# Patient Record
Sex: Female | Born: 1940
Health system: Southern US, Community
[De-identification: ages and names within clinical notes are randomized; demographics above are authoritative.]

## PROBLEM LIST (undated history)

## (undated) DIAGNOSIS — H9203 Otalgia, bilateral: Secondary | ICD-10-CM

## (undated) DIAGNOSIS — B9681 Helicobacter pylori [H. pylori] as the cause of diseases classified elsewhere: Secondary | ICD-10-CM

## (undated) DIAGNOSIS — Z8639 Personal history of other endocrine, nutritional and metabolic disease: Secondary | ICD-10-CM

## (undated) DIAGNOSIS — R42 Dizziness and giddiness: Secondary | ICD-10-CM

## (undated) DIAGNOSIS — L309 Dermatitis, unspecified: Secondary | ICD-10-CM

## (undated) DIAGNOSIS — J45909 Unspecified asthma, uncomplicated: Secondary | ICD-10-CM

## (undated) DIAGNOSIS — E1159 Type 2 diabetes mellitus with other circulatory complications: Secondary | ICD-10-CM

## (undated) DIAGNOSIS — C801 Malignant (primary) neoplasm, unspecified: Secondary | ICD-10-CM

## (undated) DIAGNOSIS — R12 Heartburn: Secondary | ICD-10-CM

## (undated) DIAGNOSIS — H359 Unspecified retinal disorder: Secondary | ICD-10-CM

## (undated) DIAGNOSIS — L821 Other seborrheic keratosis: Secondary | ICD-10-CM

## (undated) DIAGNOSIS — M1990 Unspecified osteoarthritis, unspecified site: Secondary | ICD-10-CM

## (undated) DIAGNOSIS — I10 Essential (primary) hypertension: Secondary | ICD-10-CM

## (undated) DIAGNOSIS — M26621 Arthralgia of right temporomandibular joint: Secondary | ICD-10-CM

## (undated) DIAGNOSIS — K2970 Gastritis, unspecified, without bleeding: Secondary | ICD-10-CM

## (undated) DIAGNOSIS — M4802 Spinal stenosis, cervical region: Secondary | ICD-10-CM

## (undated) DIAGNOSIS — I152 Hypertension secondary to endocrine disorders: Secondary | ICD-10-CM

## (undated) DIAGNOSIS — E7800 Pure hypercholesterolemia, unspecified: Secondary | ICD-10-CM

## (undated) DIAGNOSIS — H6980 Other specified disorders of Eustachian tube, unspecified ear: Secondary | ICD-10-CM

## (undated) DIAGNOSIS — R6889 Other general symptoms and signs: Secondary | ICD-10-CM

## (undated) DIAGNOSIS — Z85118 Personal history of other malignant neoplasm of bronchus and lung: Secondary | ICD-10-CM

## (undated) HISTORY — DX: Spinal stenosis, cervical region: M48.02

## (undated) HISTORY — DX: Other seborrheic keratosis: L82.1

## (undated) HISTORY — DX: Hypertension secondary to endocrine disorders: I15.2

## (undated) HISTORY — DX: Heartburn: R12

## (undated) HISTORY — DX: Gastritis, unspecified, without bleeding: K29.70

## (undated) HISTORY — DX: Unspecified osteoarthritis, unspecified site: M19.90

## (undated) HISTORY — DX: Personal history of other endocrine, nutritional and metabolic disease: Z86.39

## (undated) HISTORY — DX: Other specified disorders of Eustachian tube, unspecified ear: H69.80

## (undated) HISTORY — DX: Pure hypercholesterolemia, unspecified: E78.00

## (undated) HISTORY — DX: Unspecified retinal disorder: H35.9

## (undated) HISTORY — DX: Arthralgia of right temporomandibular joint: M26.621

## (undated) HISTORY — DX: Other general symptoms and signs: R68.89

## (undated) HISTORY — DX: Helicobacter pylori (H. pylori) as the cause of diseases classified elsewhere: B96.81

## (undated) HISTORY — DX: Personal history of other malignant neoplasm of bronchus and lung: Z85.118

## (undated) HISTORY — DX: Dizziness and giddiness: R42

## (undated) HISTORY — DX: Otalgia, bilateral: H92.03

## (undated) HISTORY — DX: Type 2 diabetes mellitus with other circulatory complications: E11.59

## (undated) HISTORY — DX: Dermatitis, unspecified: L30.9

---

## 1964-02-16 HISTORY — PX: VAGINAL HYSTERECTOMY: SHX2639

## 1997-07-01 ENCOUNTER — Ambulatory Visit (HOSPITAL_BASED_OUTPATIENT_CLINIC_OR_DEPARTMENT_OTHER): Admission: RE | Admit: 1997-07-01 | Discharge: 1997-07-01 | Payer: Self-pay

## 1997-07-02 ENCOUNTER — Emergency Department (HOSPITAL_COMMUNITY): Admission: EM | Admit: 1997-07-02 | Discharge: 1997-07-02 | Payer: Self-pay | Admitting: Emergency Medicine

## 1998-12-01 ENCOUNTER — Encounter: Payer: Self-pay | Admitting: Family Medicine

## 1998-12-01 ENCOUNTER — Ambulatory Visit (HOSPITAL_COMMUNITY): Admission: RE | Admit: 1998-12-01 | Discharge: 1998-12-01 | Payer: Self-pay | Admitting: Family Medicine

## 1999-04-16 ENCOUNTER — Ambulatory Visit (HOSPITAL_COMMUNITY): Admission: RE | Admit: 1999-04-16 | Discharge: 1999-04-16 | Payer: Self-pay | Admitting: Family Medicine

## 2002-08-10 ENCOUNTER — Ambulatory Visit (HOSPITAL_COMMUNITY): Admission: RE | Admit: 2002-08-10 | Discharge: 2002-08-10 | Payer: Self-pay | Admitting: Internal Medicine

## 2002-08-10 ENCOUNTER — Encounter: Payer: Self-pay | Admitting: Internal Medicine

## 2002-08-16 ENCOUNTER — Ambulatory Visit (HOSPITAL_COMMUNITY): Admission: RE | Admit: 2002-08-16 | Discharge: 2002-08-16 | Payer: Self-pay | Admitting: Internal Medicine

## 2002-08-16 ENCOUNTER — Encounter: Payer: Self-pay | Admitting: Internal Medicine

## 2002-09-06 ENCOUNTER — Encounter: Payer: Self-pay | Admitting: General Surgery

## 2002-09-06 ENCOUNTER — Ambulatory Visit (HOSPITAL_COMMUNITY): Admission: RE | Admit: 2002-09-06 | Discharge: 2002-09-06 | Payer: Self-pay | Admitting: General Surgery

## 2002-09-06 ENCOUNTER — Encounter (INDEPENDENT_AMBULATORY_CARE_PROVIDER_SITE_OTHER): Payer: Self-pay | Admitting: *Deleted

## 2002-10-09 ENCOUNTER — Encounter: Payer: Self-pay | Admitting: General Surgery

## 2002-10-11 ENCOUNTER — Ambulatory Visit (HOSPITAL_COMMUNITY): Admission: RE | Admit: 2002-10-11 | Discharge: 2002-10-12 | Payer: Self-pay | Admitting: General Surgery

## 2002-10-11 ENCOUNTER — Encounter (INDEPENDENT_AMBULATORY_CARE_PROVIDER_SITE_OTHER): Payer: Self-pay | Admitting: *Deleted

## 2002-10-11 HISTORY — PX: OTHER SURGICAL HISTORY: SHX169

## 2003-01-03 ENCOUNTER — Emergency Department (HOSPITAL_COMMUNITY): Admission: AD | Admit: 2003-01-03 | Discharge: 2003-01-03 | Payer: Self-pay | Admitting: Family Medicine

## 2003-01-06 ENCOUNTER — Emergency Department (HOSPITAL_COMMUNITY): Admission: AD | Admit: 2003-01-06 | Discharge: 2003-01-06 | Payer: Self-pay | Admitting: Family Medicine

## 2003-01-08 ENCOUNTER — Emergency Department (HOSPITAL_COMMUNITY): Admission: AD | Admit: 2003-01-08 | Discharge: 2003-01-08 | Payer: Self-pay | Admitting: Family Medicine

## 2003-11-21 ENCOUNTER — Ambulatory Visit: Payer: Self-pay | Admitting: *Deleted

## 2004-01-07 ENCOUNTER — Ambulatory Visit: Payer: Self-pay | Admitting: Family Medicine

## 2004-01-21 ENCOUNTER — Ambulatory Visit: Payer: Self-pay | Admitting: Family Medicine

## 2004-06-30 ENCOUNTER — Ambulatory Visit: Payer: Self-pay | Admitting: Family Medicine

## 2004-08-11 ENCOUNTER — Ambulatory Visit: Payer: Self-pay | Admitting: Family Medicine

## 2004-08-27 ENCOUNTER — Ambulatory Visit (HOSPITAL_COMMUNITY): Admission: RE | Admit: 2004-08-27 | Discharge: 2004-08-27 | Payer: Self-pay | Admitting: Family Medicine

## 2005-01-12 ENCOUNTER — Ambulatory Visit: Payer: Self-pay | Admitting: Family Medicine

## 2005-02-10 ENCOUNTER — Ambulatory Visit: Payer: Self-pay | Admitting: Family Medicine

## 2005-06-22 ENCOUNTER — Ambulatory Visit: Payer: Self-pay | Admitting: Family Medicine

## 2005-07-08 ENCOUNTER — Ambulatory Visit: Payer: Self-pay | Admitting: Internal Medicine

## 2005-08-24 ENCOUNTER — Ambulatory Visit: Payer: Self-pay | Admitting: Internal Medicine

## 2005-09-07 ENCOUNTER — Ambulatory Visit (HOSPITAL_COMMUNITY): Admission: RE | Admit: 2005-09-07 | Discharge: 2005-09-07 | Payer: Self-pay | Admitting: Family Medicine

## 2005-12-06 ENCOUNTER — Ambulatory Visit: Payer: Self-pay | Admitting: Family Medicine

## 2006-02-01 ENCOUNTER — Ambulatory Visit: Payer: Self-pay | Admitting: Family Medicine

## 2006-04-13 ENCOUNTER — Ambulatory Visit: Payer: Self-pay | Admitting: Family Medicine

## 2006-05-03 ENCOUNTER — Emergency Department (HOSPITAL_COMMUNITY): Admission: EM | Admit: 2006-05-03 | Discharge: 2006-05-03 | Payer: Self-pay | Admitting: Family Medicine

## 2006-07-04 ENCOUNTER — Ambulatory Visit: Payer: Self-pay | Admitting: Internal Medicine

## 2006-09-08 ENCOUNTER — Ambulatory Visit: Payer: Self-pay | Admitting: Internal Medicine

## 2007-01-10 ENCOUNTER — Ambulatory Visit: Payer: Self-pay | Admitting: Internal Medicine

## 2007-02-02 ENCOUNTER — Ambulatory Visit (HOSPITAL_COMMUNITY): Admission: RE | Admit: 2007-02-02 | Discharge: 2007-02-02 | Payer: Self-pay | Admitting: Family Medicine

## 2007-06-16 ENCOUNTER — Emergency Department (HOSPITAL_COMMUNITY): Admission: EM | Admit: 2007-06-16 | Discharge: 2007-06-16 | Payer: Self-pay | Admitting: Emergency Medicine

## 2007-06-23 ENCOUNTER — Ambulatory Visit: Payer: Self-pay | Admitting: Internal Medicine

## 2007-06-23 LAB — CONVERTED CEMR LAB
ALT: 23 units/L (ref 0–35)
AST: 13 units/L (ref 0–37)
Albumin: 4.2 g/dL (ref 3.5–5.2)
Alkaline Phosphatase: 110 units/L (ref 39–117)
BUN: 15 mg/dL (ref 6–23)
CO2: 24 meq/L (ref 19–32)
Calcium: 9.3 mg/dL (ref 8.4–10.5)
Chloride: 105 meq/L (ref 96–112)
Cortisol, Plasma: 10.7 ug/dL
Creatinine, Ser: 0.99 mg/dL (ref 0.40–1.20)
Free T4: 1.18 ng/dL (ref 0.89–1.80)
Free Thyroxine Index: 2.6 (ref 1.0–3.9)
Glucose, Bld: 109 mg/dL — ABNORMAL HIGH (ref 70–99)
Potassium: 3.7 meq/L (ref 3.5–5.3)
Sodium: 143 meq/L (ref 135–145)
T3 Uptake Ratio: 30.6 % (ref 22.5–37.0)
T4, Total: 8.6 ug/dL (ref 5.0–12.5)
TSH: 1.35 microintl units/mL (ref 0.350–5.50)
Total Bilirubin: 0.5 mg/dL (ref 0.3–1.2)
Total Protein: 7.4 g/dL (ref 6.0–8.3)

## 2007-09-20 ENCOUNTER — Emergency Department (HOSPITAL_COMMUNITY): Admission: EM | Admit: 2007-09-20 | Discharge: 2007-09-20 | Payer: Self-pay | Admitting: Family Medicine

## 2007-11-03 ENCOUNTER — Ambulatory Visit: Payer: Self-pay | Admitting: Internal Medicine

## 2007-11-16 ENCOUNTER — Ambulatory Visit: Payer: Self-pay | Admitting: Internal Medicine

## 2007-11-23 ENCOUNTER — Ambulatory Visit: Payer: Self-pay | Admitting: Family Medicine

## 2007-11-23 ENCOUNTER — Encounter (INDEPENDENT_AMBULATORY_CARE_PROVIDER_SITE_OTHER): Payer: Self-pay | Admitting: Family Medicine

## 2007-11-23 LAB — CONVERTED CEMR LAB
ALT: 18 units/L (ref 0–35)
AST: 20 units/L (ref 0–37)
Albumin: 4.4 g/dL (ref 3.5–5.2)
Alkaline Phosphatase: 122 units/L — ABNORMAL HIGH (ref 39–117)
BUN: 14 mg/dL (ref 6–23)
Basophils Absolute: 0 10*3/uL (ref 0.0–0.1)
Basophils Relative: 1 % (ref 0–1)
CO2: 23 meq/L (ref 19–32)
Calcium: 9.6 mg/dL (ref 8.4–10.5)
Chloride: 105 meq/L (ref 96–112)
Cholesterol: 164 mg/dL (ref 0–200)
Creatinine, Ser: 0.86 mg/dL (ref 0.40–1.20)
Eosinophils Absolute: 0.2 10*3/uL (ref 0.0–0.7)
Eosinophils Relative: 4 % (ref 0–5)
Glucose, Bld: 89 mg/dL (ref 70–99)
HCT: 41.3 % (ref 36.0–46.0)
HDL: 42 mg/dL (ref >39)
Hemoglobin: 13.6 g/dL (ref 12.0–15.0)
LDL Cholesterol: 102 mg/dL — ABNORMAL HIGH (ref 0–99)
Lymphocytes Relative: 36 % (ref 12–46)
Lymphs Abs: 1.9 10*3/uL (ref 0.7–4.0)
MCHC: 32.9 g/dL (ref 30.0–36.0)
MCV: 83.4 fL (ref 78.0–100.0)
Monocytes Absolute: 0.6 10*3/uL (ref 0.1–1.0)
Monocytes Relative: 11 % (ref 3–12)
Neutro Abs: 2.5 10*3/uL (ref 1.7–7.7)
Neutrophils Relative %: 48 % (ref 43–77)
Platelets: 320 10*3/uL (ref 150–400)
Potassium: 5 meq/L (ref 3.5–5.3)
RBC: 4.95 M/uL (ref 3.87–5.11)
RDW: 15.9 % — ABNORMAL HIGH (ref 11.5–15.5)
Sodium: 139 meq/L (ref 135–145)
Total Bilirubin: 0.5 mg/dL (ref 0.3–1.2)
Total CHOL/HDL Ratio: 3.9
Total Protein: 7.4 g/dL (ref 6.0–8.3)
Triglycerides: 99 mg/dL (ref <150)
VLDL: 20 mg/dL (ref 0–40)
WBC: 5.2 10*3/uL (ref 4.0–10.5)

## 2008-03-08 ENCOUNTER — Ambulatory Visit (HOSPITAL_COMMUNITY): Admission: RE | Admit: 2008-03-08 | Discharge: 2008-03-08 | Payer: Self-pay | Admitting: Internal Medicine

## 2008-03-13 ENCOUNTER — Ambulatory Visit: Payer: Self-pay | Admitting: Internal Medicine

## 2008-05-01 ENCOUNTER — Ambulatory Visit (HOSPITAL_COMMUNITY): Admission: RE | Admit: 2008-05-01 | Discharge: 2008-05-01 | Payer: Self-pay | Admitting: Internal Medicine

## 2008-05-06 ENCOUNTER — Ambulatory Visit (HOSPITAL_COMMUNITY)
Admission: RE | Admit: 2008-05-06 | Discharge: 2008-05-06 | Payer: Self-pay | Admitting: Thoracic Surgery (Cardiothoracic Vascular Surgery)

## 2008-05-09 ENCOUNTER — Ambulatory Visit: Payer: Self-pay | Admitting: Thoracic Surgery (Cardiothoracic Vascular Surgery)

## 2008-05-23 ENCOUNTER — Ambulatory Visit: Payer: Self-pay | Admitting: Thoracic Surgery (Cardiothoracic Vascular Surgery)

## 2008-05-23 ENCOUNTER — Encounter: Payer: Self-pay | Admitting: Thoracic Surgery (Cardiothoracic Vascular Surgery)

## 2008-05-23 ENCOUNTER — Inpatient Hospital Stay (HOSPITAL_COMMUNITY)
Admission: RE | Admit: 2008-05-23 | Discharge: 2008-05-27 | Payer: Self-pay | Admitting: Thoracic Surgery (Cardiothoracic Vascular Surgery)

## 2008-05-23 HISTORY — PX: OTHER SURGICAL HISTORY: SHX169

## 2008-06-03 ENCOUNTER — Ambulatory Visit: Payer: Self-pay | Admitting: Thoracic Surgery (Cardiothoracic Vascular Surgery)

## 2008-06-17 ENCOUNTER — Encounter
Admission: RE | Admit: 2008-06-17 | Discharge: 2008-06-17 | Payer: Self-pay | Admitting: Thoracic Surgery (Cardiothoracic Vascular Surgery)

## 2008-06-17 ENCOUNTER — Ambulatory Visit: Payer: Self-pay | Admitting: Thoracic Surgery (Cardiothoracic Vascular Surgery)

## 2008-07-08 ENCOUNTER — Ambulatory Visit: Payer: Self-pay | Admitting: Family Medicine

## 2008-07-08 ENCOUNTER — Encounter: Payer: Self-pay | Admitting: Internal Medicine

## 2008-07-08 DIAGNOSIS — M79609 Pain in unspecified limb: Secondary | ICD-10-CM | POA: Insufficient documentation

## 2008-07-08 DIAGNOSIS — L989 Disorder of the skin and subcutaneous tissue, unspecified: Secondary | ICD-10-CM | POA: Insufficient documentation

## 2008-07-10 ENCOUNTER — Ambulatory Visit (HOSPITAL_COMMUNITY): Admission: RE | Admit: 2008-07-10 | Discharge: 2008-07-10 | Payer: Self-pay | Admitting: Internal Medicine

## 2008-07-31 ENCOUNTER — Ambulatory Visit: Payer: Self-pay | Admitting: Thoracic Surgery (Cardiothoracic Vascular Surgery)

## 2008-09-19 ENCOUNTER — Ambulatory Visit: Payer: Self-pay | Admitting: Family Medicine

## 2008-09-19 ENCOUNTER — Encounter (INDEPENDENT_AMBULATORY_CARE_PROVIDER_SITE_OTHER): Payer: Self-pay | Admitting: Adult Health

## 2008-09-19 LAB — CONVERTED CEMR LAB
ALT: 12 units/L (ref 0–35)
AST: 16 units/L (ref 0–37)
Albumin: 4.3 g/dL (ref 3.5–5.2)
Alkaline Phosphatase: 124 units/L — ABNORMAL HIGH (ref 39–117)
BUN: 14 mg/dL (ref 6–23)
Basophils Absolute: 0 10*3/uL (ref 0.0–0.1)
Basophils Relative: 0 % (ref 0–1)
CO2: 22 meq/L (ref 19–32)
Calcium: 9.4 mg/dL (ref 8.4–10.5)
Chloride: 104 meq/L (ref 96–112)
Creatinine, Ser: 0.8 mg/dL (ref 0.40–1.20)
Eosinophils Absolute: 0.2 10*3/uL (ref 0.0–0.7)
Eosinophils Relative: 4 % (ref 0–5)
Glucose, Bld: 83 mg/dL (ref 70–99)
HCT: 38 % (ref 36.0–46.0)
Hemoglobin: 12.7 g/dL (ref 12.0–15.0)
Lymphocytes Relative: 36 % (ref 12–46)
Lymphs Abs: 2.3 10*3/uL (ref 0.7–4.0)
MCHC: 33.4 g/dL (ref 30.0–36.0)
MCV: 80.9 fL (ref 78.0–100.0)
Microalb, Ur: 0.86 mg/dL (ref 0.00–1.89)
Monocytes Absolute: 0.9 10*3/uL (ref 0.1–1.0)
Monocytes Relative: 15 % — ABNORMAL HIGH (ref 3–12)
Neutro Abs: 2.8 10*3/uL (ref 1.7–7.7)
Neutrophils Relative %: 45 % (ref 43–77)
Platelets: 304 10*3/uL (ref 150–400)
Potassium: 4.3 meq/L (ref 3.5–5.3)
RBC: 4.7 M/uL (ref 3.87–5.11)
RDW: 15.9 % — ABNORMAL HIGH (ref 11.5–15.5)
Sodium: 138 meq/L (ref 135–145)
TSH: 0.895 microintl units/mL (ref 0.350–4.500)
Total Bilirubin: 0.6 mg/dL (ref 0.3–1.2)
Total Protein: 7.3 g/dL (ref 6.0–8.3)
Vit D, 25-Hydroxy: 11 ng/mL — ABNORMAL LOW (ref 30–89)
WBC: 6.3 10*3/uL (ref 4.0–10.5)

## 2008-10-28 ENCOUNTER — Encounter
Admission: RE | Admit: 2008-10-28 | Discharge: 2008-10-28 | Payer: Self-pay | Admitting: Thoracic Surgery (Cardiothoracic Vascular Surgery)

## 2008-10-28 ENCOUNTER — Ambulatory Visit: Payer: Self-pay | Admitting: Thoracic Surgery (Cardiothoracic Vascular Surgery)

## 2009-01-22 ENCOUNTER — Ambulatory Visit: Payer: Self-pay | Admitting: Internal Medicine

## 2009-03-06 ENCOUNTER — Encounter (INDEPENDENT_AMBULATORY_CARE_PROVIDER_SITE_OTHER): Payer: Self-pay | Admitting: Adult Health

## 2009-03-06 ENCOUNTER — Telehealth (INDEPENDENT_AMBULATORY_CARE_PROVIDER_SITE_OTHER): Payer: Self-pay | Admitting: Adult Health

## 2009-03-06 ENCOUNTER — Ambulatory Visit: Payer: Self-pay | Admitting: Family Medicine

## 2009-03-06 LAB — CONVERTED CEMR LAB: Pro B Natriuretic peptide (BNP): 16.6 pg/mL (ref 0.0–100.0)

## 2009-03-10 ENCOUNTER — Ambulatory Visit: Payer: Self-pay | Admitting: Thoracic Surgery (Cardiothoracic Vascular Surgery)

## 2009-03-10 ENCOUNTER — Encounter
Admission: RE | Admit: 2009-03-10 | Discharge: 2009-03-10 | Payer: Self-pay | Admitting: Thoracic Surgery (Cardiothoracic Vascular Surgery)

## 2009-03-21 ENCOUNTER — Emergency Department (HOSPITAL_COMMUNITY): Admission: EM | Admit: 2009-03-21 | Discharge: 2009-03-21 | Payer: Self-pay | Admitting: Family Medicine

## 2009-03-27 ENCOUNTER — Ambulatory Visit (HOSPITAL_COMMUNITY): Admission: RE | Admit: 2009-03-27 | Discharge: 2009-03-27 | Payer: Self-pay | Admitting: Internal Medicine

## 2009-04-09 ENCOUNTER — Ambulatory Visit: Payer: Self-pay | Admitting: Internal Medicine

## 2009-04-09 ENCOUNTER — Encounter (INDEPENDENT_AMBULATORY_CARE_PROVIDER_SITE_OTHER): Payer: Self-pay | Admitting: Adult Health

## 2009-04-09 LAB — CONVERTED CEMR LAB
ALT: 13 units/L (ref 0–35)
AST: 13 units/L (ref 0–37)
Albumin: 4 g/dL (ref 3.5–5.2)
Alkaline Phosphatase: 99 units/L (ref 39–117)
BUN: 11 mg/dL (ref 6–23)
Basophils Absolute: 0 10*3/uL (ref 0.0–0.1)
Basophils Relative: 1 % (ref 0–1)
CO2: 26 meq/L (ref 19–32)
Calcium: 9.3 mg/dL (ref 8.4–10.5)
Chloride: 107 meq/L (ref 96–112)
Cholesterol: 141 mg/dL (ref 0–200)
Creatinine, Ser: 0.72 mg/dL (ref 0.40–1.20)
Eosinophils Absolute: 0.2 10*3/uL (ref 0.0–0.7)
Eosinophils Relative: 4 % (ref 0–5)
Glucose, Bld: 99 mg/dL (ref 70–99)
HCT: 39.6 % (ref 36.0–46.0)
HDL: 50 mg/dL (ref >39)
Hemoglobin: 12.8 g/dL (ref 12.0–15.0)
Hgb A1c MFr Bld: 6.1 % (ref 4.6–6.1)
LDL Cholesterol: 76 mg/dL (ref 0–99)
Lymphocytes Relative: 32 % (ref 12–46)
Lymphs Abs: 1.7 10*3/uL (ref 0.7–4.0)
MCHC: 32.3 g/dL (ref 30.0–36.0)
MCV: 84.1 fL (ref 78.0–100.0)
Monocytes Absolute: 0.7 10*3/uL (ref 0.1–1.0)
Monocytes Relative: 13 % — ABNORMAL HIGH (ref 3–12)
Neutro Abs: 2.6 10*3/uL (ref 1.7–7.7)
Neutrophils Relative %: 50 % (ref 43–77)
Platelets: 268 10*3/uL (ref 150–400)
Potassium: 3.7 meq/L (ref 3.5–5.3)
RBC: 4.71 M/uL (ref 3.87–5.11)
RDW: 15.7 % — ABNORMAL HIGH (ref 11.5–15.5)
Sodium: 142 meq/L (ref 135–145)
Total Bilirubin: 0.5 mg/dL (ref 0.3–1.2)
Total CHOL/HDL Ratio: 2.8
Total Protein: 6.9 g/dL (ref 6.0–8.3)
Triglycerides: 74 mg/dL (ref <150)
VLDL: 15 mg/dL (ref 0–40)
Vit D, 25-Hydroxy: 25 ng/mL — ABNORMAL LOW (ref 30–89)
WBC: 5.2 10*3/uL (ref 4.0–10.5)

## 2009-04-22 ENCOUNTER — Ambulatory Visit: Payer: Self-pay | Admitting: Internal Medicine

## 2009-05-27 ENCOUNTER — Ambulatory Visit: Payer: Self-pay | Admitting: Thoracic Surgery (Cardiothoracic Vascular Surgery)

## 2009-05-27 ENCOUNTER — Encounter
Admission: RE | Admit: 2009-05-27 | Discharge: 2009-05-27 | Payer: Self-pay | Admitting: Thoracic Surgery (Cardiothoracic Vascular Surgery)

## 2009-08-10 ENCOUNTER — Emergency Department (HOSPITAL_COMMUNITY): Admission: EM | Admit: 2009-08-10 | Discharge: 2009-08-10 | Payer: Self-pay | Admitting: Family Medicine

## 2009-08-22 ENCOUNTER — Ambulatory Visit: Payer: Self-pay | Admitting: Internal Medicine

## 2009-09-30 ENCOUNTER — Encounter
Admission: RE | Admit: 2009-09-30 | Discharge: 2009-09-30 | Payer: Self-pay | Admitting: Thoracic Surgery (Cardiothoracic Vascular Surgery)

## 2009-09-30 ENCOUNTER — Ambulatory Visit: Payer: Self-pay | Admitting: Thoracic Surgery (Cardiothoracic Vascular Surgery)

## 2009-12-02 ENCOUNTER — Encounter (INDEPENDENT_AMBULATORY_CARE_PROVIDER_SITE_OTHER): Payer: Self-pay | Admitting: *Deleted

## 2009-12-02 LAB — CONVERTED CEMR LAB: Microalb, Ur: 0.79 mg/dL (ref 0.00–1.89)

## 2009-12-17 ENCOUNTER — Ambulatory Visit: Payer: Self-pay | Admitting: Cardiology

## 2009-12-17 ENCOUNTER — Ambulatory Visit (HOSPITAL_COMMUNITY): Admission: RE | Admit: 2009-12-17 | Discharge: 2009-12-17 | Payer: Self-pay | Admitting: Family Medicine

## 2009-12-17 ENCOUNTER — Encounter (INDEPENDENT_AMBULATORY_CARE_PROVIDER_SITE_OTHER): Payer: Self-pay | Admitting: Family Medicine

## 2010-01-05 ENCOUNTER — Encounter
Admission: RE | Admit: 2010-01-05 | Discharge: 2010-01-05 | Payer: Self-pay | Admitting: Thoracic Surgery (Cardiothoracic Vascular Surgery)

## 2010-01-05 ENCOUNTER — Ambulatory Visit: Payer: Self-pay | Admitting: Thoracic Surgery (Cardiothoracic Vascular Surgery)

## 2010-03-08 ENCOUNTER — Encounter: Payer: Self-pay | Admitting: Internal Medicine

## 2010-03-10 ENCOUNTER — Other Ambulatory Visit (HOSPITAL_COMMUNITY): Payer: Self-pay | Admitting: Family Medicine

## 2010-03-10 DIAGNOSIS — Z1239 Encounter for other screening for malignant neoplasm of breast: Secondary | ICD-10-CM

## 2010-03-10 DIAGNOSIS — Z1231 Encounter for screening mammogram for malignant neoplasm of breast: Secondary | ICD-10-CM

## 2010-03-17 NOTE — Miscellaneous (Signed)
Summary: Office Visit ([facility])    Vital Signs:  Patient profile:   70 year old female Menstrual status:  postmenopausal Weight:      184 pounds Temp:     98.4 degrees F oral Pulse rate:   72 / minute Pulse rhythm:   regular Resp:     18 per minute BP sitting:   130 / 80  (left arm)  Vitals Entered By: Sharen Heck RN (Jul 08, 2008 2:49 PM) CC: pain at inner part of right baby finger shooting to hand, wrist and arm Is Patient Diabetic? No Pain Assessment Patient in pain? yes     Location: right hand Intensity: 4 Type: sharp Onset of pain  several years  Does patient need assistance? Functional Status Self care Ambulation Normal  years   days  Menstrual Status postmenopausal   CC:  pain at inner part of right baby finger shooting to hand and wrist and arm.  History of Present Illness: Pt c/o a painful lesion on right 5th finger that she ahs ahd for years. It occasionally swells up and drains pus.  She is not aware of ever having a foreign body.  No known injury.  Sometimes she has pain in her hand, wrist and up her arm.  The pain seems worse at night.  Preventive Screening-Counseling & Management     Alcohol drinks/day: 0     Smoking Status: quit     Caffeine use/day: 0     Does Patient Exercise: no  Allergies (verified): 1)  ! * Toprol 2)  ! * Maxide   Additional History    Menstrual Status:  postmenopausal  Review of Systems  The patient denies anorexia, fever, and weight loss.     Physical Exam  General:  alert, well-developed, well-nourished, and well-hydrated.   Head:  normocephalic and atraumatic.   Msk:  no swelling or erythema of right hand or wrist Extremities:  small calloused area on distal 5 th finger   Impression & Recommendations:  Problem # 1:  HAND PAIN, RIGHT (ICD-729.5) Will obtain an x-ray Orders: Diagnostic X-Ray/Fluoroscopy (Diagnostic X-Ray/Flu)  Problem # 2:  SKIN LESION (ICD-709.9) refer to dermatology  Other  Orders: Dermatology Referral (Derma)   Patient Instructions: 1)  Schedule appt in Dermatology clinic

## 2010-03-17 NOTE — Progress Notes (Signed)
Summary: cough  Phone Note Call from Patient   Caller: Patient Summary of Call: Pt requesting medication for cough. Dry cough worse at night, throat feels scratchy, afebrile, nasal d/c clear with streaks of  blood for past 2 days.  Hx of lung surgery April 2010, will have Amy review. Instructed to gargel with warm salt water, use humidifier, elevate H.O.B., nasal saline spray, check with pharmacist for cough med appropriate for her HTN. Pt states she has been doing all these things with no relief. Need to contact us if she develops fever, mucous yellow/green or starts to feel worse. Initial call taken by: Gaylyn Cheers RN,  March 06, 2009 9:37 AM

## 2010-03-26 ENCOUNTER — Other Ambulatory Visit (HOSPITAL_COMMUNITY): Payer: Self-pay | Admitting: Family Medicine

## 2010-03-26 ENCOUNTER — Ambulatory Visit (HOSPITAL_COMMUNITY)
Admission: RE | Admit: 2010-03-26 | Discharge: 2010-03-26 | Disposition: A | Discharge status: Home / Self Care | Payer: Medicare (Managed Care) | Source: Ambulatory Visit | Attending: Family Medicine | Admitting: Family Medicine

## 2010-03-26 DIAGNOSIS — Z1231 Encounter for screening mammogram for malignant neoplasm of breast: Secondary | ICD-10-CM

## 2010-04-02 ENCOUNTER — Ambulatory Visit (HOSPITAL_COMMUNITY): Payer: Self-pay

## 2010-04-02 ENCOUNTER — Encounter (HOSPITAL_COMMUNITY): Payer: Self-pay

## 2010-04-02 ENCOUNTER — Ambulatory Visit (HOSPITAL_COMMUNITY)
Admission: RE | Admit: 2010-04-02 | Discharge: 2010-04-02 | Disposition: A | Discharge status: Home / Self Care | Payer: Medicare (Managed Care) | Source: Ambulatory Visit | Attending: Family Medicine | Admitting: Family Medicine

## 2010-04-02 DIAGNOSIS — Z1231 Encounter for screening mammogram for malignant neoplasm of breast: Secondary | ICD-10-CM

## 2010-04-30 ENCOUNTER — Other Ambulatory Visit: Payer: Self-pay | Admitting: Thoracic Surgery (Cardiothoracic Vascular Surgery)

## 2010-04-30 DIAGNOSIS — C3490 Malignant neoplasm of unspecified part of unspecified bronchus or lung: Secondary | ICD-10-CM

## 2010-05-18 ENCOUNTER — Ambulatory Visit
Admission: RE | Admit: 2010-05-18 | Discharge: 2010-05-18 | Disposition: A | Discharge status: Home / Self Care | Payer: Medicare (Managed Care) | Source: Ambulatory Visit | Attending: Thoracic Surgery (Cardiothoracic Vascular Surgery) | Admitting: Thoracic Surgery (Cardiothoracic Vascular Surgery)

## 2010-05-18 ENCOUNTER — Ambulatory Visit: Payer: Medicare (Managed Care) | Admitting: Thoracic Surgery (Cardiothoracic Vascular Surgery)

## 2010-05-18 ENCOUNTER — Ambulatory Visit (INDEPENDENT_AMBULATORY_CARE_PROVIDER_SITE_OTHER): Payer: Medicare (Managed Care) | Admitting: Thoracic Surgery (Cardiothoracic Vascular Surgery)

## 2010-05-18 ENCOUNTER — Other Ambulatory Visit: Payer: Medicare (Managed Care)

## 2010-05-18 DIAGNOSIS — C3490 Malignant neoplasm of unspecified part of unspecified bronchus or lung: Secondary | ICD-10-CM

## 2010-05-19 NOTE — Assessment & Plan Note (Signed)
OFFICE VISIT  Susan, Ingram DOB:  03/04/1940                                        May 18, 2010 CHART #:  [id number]  REASON FOR VISIT:  Followup after lung cancer resection.  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  She was last seen in the office in November of last year at which time she was doing well with no evidence for recurrent disease.  She states that overall she is doing well.  Her biggest complaint is that sometimes when she bends over, she has a sharp pain where her incisions are.  This sometimes is very severe.  It is usually short-lived and resolves within a minute of two.  She does not have any pain outside of that, which is attributable to positioning.  She has not had any trouble with her breathing.  She has actually gained weight since her last visit.  She had not had any new medical issues since her last visit.  PAST MEDICAL HISTORY:  Significant for non-small cell carcinoma, status post lingular segmentectomy, hypertension, hypercholesterolemia, chronic obstructive pulmonary disease, and eczema.  CURRENT MEDICATIONS: 1. Furosemide 40 mg daily. 2. Calcium plus D 2 tablets daily. 3. Lasix daily. 4. Potassium daily. 5. Lisinopril 20 mg daily. 6. Ventolin inhaler 2 puffs p.r.n.  She has an adverse reaction to AMLODIPINE, which causes swelling in her legs and ankles.  FAMILY HISTORY:  Unchanged.  SOCIAL HISTORY:  She continues to abstain from smoking.  She is still working.  REVIEW OF SYSTEMS:  See HPI, otherwise all systems negative.  PHYSICAL EXAMINATION:  GENERAL:  Susan Ingram is a 70 year old woman in no acute distress.  She is well developed and well-nourished. VITAL SIGNS:  Blood pressure is 130/80, pulse 76, respirations 18, ox saturation is 98% on room  air. NEUROLOGIC:  She is alert and oriented x3 with no focal deficits. HEENT:  Unremarkable except for glasses. NECK:  Supple without thyromegaly or adenopathy.  There is no cervical or supraclavicular adenopathy or axillary or epitrochlear adenopathy. LUNGS:  Clear with equal breath sounds bilaterally. CARDIAC:  Regular rate and rhythm.  Normal S1 and S2.  No rubs, murmurs, or gallops.  LABORATORY DATA:  CT of the chest has been done but has not yet been officially read and reviewed in comparison to her CT from January 05, 2010.  There does not appear to have been any interval change.  There does not appear to be any evidence of recurrent disease.  We will await the final radiologist report.  IMPRESSION:  Susan Ingram is a 70 year old woman.  She is now 2 years out from lingular segmentectomy for T1 and stage I A non-small cell carcinoma.  She has no evidence for recurrent disease at this time.  At this point, we will plan to follow her up at 37-month intervals.  I will see her back in 6 months with a chest x-ray.  We will continue to do CTs of the chest on her annual visits.  Salvatore Decent Dorris Fetch, M.D. Electronically Signed  SCH/MEDQ  D:  05/18/2010  T:  05/19/2010  Job:  [id number]  cc:   Dineen Kid. Reche Dixon, M.D.

## 2010-05-27 LAB — CBC
HCT: 33.7 % — ABNORMAL LOW (ref 36.0–46.0)
HCT: 35.2 % — ABNORMAL LOW (ref 36.0–46.0)
HCT: 35.9 % — ABNORMAL LOW (ref 36.0–46.0)
HCT: 36.1 % (ref 36.0–46.0)
Hemoglobin: 11.3 g/dL — ABNORMAL LOW (ref 12.0–15.0)
Hemoglobin: 11.7 g/dL — ABNORMAL LOW (ref 12.0–15.0)
Hemoglobin: 12.3 g/dL (ref 12.0–15.0)
Hemoglobin: 12.5 g/dL (ref 12.0–15.0)
MCHC: 33.2 g/dL (ref 30.0–36.0)
MCHC: 33.5 g/dL (ref 30.0–36.0)
MCHC: 34 g/dL (ref 30.0–36.0)
MCHC: 34.8 g/dL (ref 30.0–36.0)
MCV: 83.5 fL (ref 78.0–100.0)
MCV: 84.3 fL (ref 78.0–100.0)
MCV: 85.3 fL (ref 78.0–100.0)
MCV: 85.4 fL (ref 78.0–100.0)
Platelets: 214 10*3/uL (ref 150–400)
Platelets: 231 10*3/uL (ref 150–400)
Platelets: 233 10*3/uL (ref 150–400)
Platelets: 258 10*3/uL (ref 150–400)
RBC: 3.95 MIL/uL (ref 3.87–5.11)
RBC: 4.11 MIL/uL (ref 3.87–5.11)
RBC: 4.28 MIL/uL (ref 3.87–5.11)
RBC: 4.3 MIL/uL (ref 3.87–5.11)
RDW: 14.9 % (ref 11.5–15.5)
RDW: 14.9 % (ref 11.5–15.5)
RDW: 15.2 % (ref 11.5–15.5)
RDW: 15.4 % (ref 11.5–15.5)
WBC: 12.2 10*3/uL — ABNORMAL HIGH (ref 4.0–10.5)
WBC: 12.6 10*3/uL — ABNORMAL HIGH (ref 4.0–10.5)
WBC: 13.7 10*3/uL — ABNORMAL HIGH (ref 4.0–10.5)
WBC: 7.5 10*3/uL (ref 4.0–10.5)

## 2010-05-27 LAB — BLOOD GAS, ARTERIAL
Acid-Base Excess: 1.4 mmol/L (ref 0.0–2.0)
Acid-Base Excess: 1.7 mmol/L (ref 0.0–2.0)
Bicarbonate: 25.6 mEq/L — ABNORMAL HIGH (ref 20.0–24.0)
Bicarbonate: 25.7 mEq/L — ABNORMAL HIGH (ref 20.0–24.0)
Drawn by: 206361
FIO2: 0.21 %
O2 Content: 2 L/min
O2 Saturation: 97.6 %
O2 Saturation: 98 %
Patient temperature: 98.6
Patient temperature: 99.9
TCO2: 26.8 mmol/L (ref 0–100)
TCO2: 27 mmol/L (ref 0–100)
pCO2 arterial: 38.6 mmHg (ref 35.0–45.0)
pCO2 arterial: 44.2 mmHg (ref 35.0–45.0)
pH, Arterial: 7.387 (ref 7.350–7.400)
pH, Arterial: 7.437 — ABNORMAL HIGH (ref 7.350–7.400)
pO2, Arterial: 92.8 mmHg (ref 80.0–100.0)
pO2, Arterial: 93.3 mmHg (ref 80.0–100.0)

## 2010-05-27 LAB — COMPREHENSIVE METABOLIC PANEL
ALT: 11 U/L (ref 0–35)
ALT: 21 U/L (ref 0–35)
AST: 16 U/L (ref 0–37)
AST: 20 U/L (ref 0–37)
Albumin: 2.9 g/dL — ABNORMAL LOW (ref 3.5–5.2)
Albumin: 3.6 g/dL (ref 3.5–5.2)
Alkaline Phosphatase: 108 U/L (ref 39–117)
Alkaline Phosphatase: 116 U/L (ref 39–117)
BUN: 5 mg/dL — ABNORMAL LOW (ref 6–23)
BUN: 9 mg/dL (ref 6–23)
CO2: 22 mEq/L (ref 19–32)
CO2: 27 mEq/L (ref 19–32)
Calcium: 8.6 mg/dL (ref 8.4–10.5)
Calcium: 8.8 mg/dL (ref 8.4–10.5)
Chloride: 100 mEq/L (ref 96–112)
Chloride: 105 mEq/L (ref 96–112)
Creatinine, Ser: 0.6 mg/dL (ref 0.4–1.2)
Creatinine, Ser: 0.69 mg/dL (ref 0.4–1.2)
GFR calc Af Amer: >60 mL/min (ref >60)
GFR calc Af Amer: >60 mL/min (ref >60)
GFR calc non Af Amer: >60 mL/min (ref >60)
GFR calc non Af Amer: >60 mL/min (ref >60)
Glucose, Bld: 137 mg/dL — ABNORMAL HIGH (ref 70–99)
Glucose, Bld: 85 mg/dL (ref 70–99)
Potassium: 3.6 mEq/L (ref 3.5–5.1)
Potassium: 4 mEq/L (ref 3.5–5.1)
Sodium: 133 mEq/L — ABNORMAL LOW (ref 135–145)
Sodium: 137 mEq/L (ref 135–145)
Total Bilirubin: 0.7 mg/dL (ref 0.3–1.2)
Total Bilirubin: 0.8 mg/dL (ref 0.3–1.2)
Total Protein: 6.1 g/dL (ref 6.0–8.3)
Total Protein: 6.7 g/dL (ref 6.0–8.3)

## 2010-05-27 LAB — URINE MICROSCOPIC-ADD ON

## 2010-05-27 LAB — URINALYSIS, ROUTINE W REFLEX MICROSCOPIC
Bilirubin Urine: NEGATIVE
Glucose, UA: NEGATIVE mg/dL
Hgb urine dipstick: NEGATIVE
Ketones, ur: NEGATIVE mg/dL
Nitrite: NEGATIVE
Protein, ur: NEGATIVE mg/dL
Specific Gravity, Urine: 1.021 (ref 1.005–1.030)
Urobilinogen, UA: 1 mg/dL (ref 0.0–1.0)
pH: 7 (ref 5.0–8.0)

## 2010-05-27 LAB — BASIC METABOLIC PANEL
BUN: 7 mg/dL (ref 6–23)
CO2: 27 mEq/L (ref 19–32)
Calcium: 8.8 mg/dL (ref 8.4–10.5)
Chloride: 104 mEq/L (ref 96–112)
Creatinine, Ser: 0.64 mg/dL (ref 0.4–1.2)
GFR calc Af Amer: >60 mL/min (ref >60)
GFR calc non Af Amer: >60 mL/min (ref >60)
Glucose, Bld: 148 mg/dL — ABNORMAL HIGH (ref 70–99)
Potassium: 3.9 mEq/L (ref 3.5–5.1)
Sodium: 136 mEq/L (ref 135–145)

## 2010-05-27 LAB — TYPE AND SCREEN
ABO/RH(D): A POS
Antibody Screen: NEGATIVE

## 2010-05-27 LAB — APTT: aPTT: 33 seconds (ref 24–37)

## 2010-05-27 LAB — PROTIME-INR
INR: 1 (ref 0.00–1.49)
Prothrombin Time: 13.7 seconds (ref 11.6–15.2)

## 2010-05-27 LAB — ABO/RH: ABO/RH(D): A POS

## 2010-05-28 LAB — GLUCOSE, CAPILLARY: Glucose-Capillary: 91 mg/dL (ref 70–99)

## 2010-06-17 ENCOUNTER — Emergency Department (HOSPITAL_COMMUNITY)
Admission: EM | Admit: 2010-06-17 | Discharge: 2010-06-17 | Disposition: A | Discharge status: Home / Self Care | Payer: Medicare (Managed Care) | Attending: Emergency Medicine | Admitting: Emergency Medicine

## 2010-06-17 ENCOUNTER — Encounter (HOSPITAL_COMMUNITY): Payer: Self-pay | Admitting: Radiology

## 2010-06-17 ENCOUNTER — Inpatient Hospital Stay (INDEPENDENT_AMBULATORY_CARE_PROVIDER_SITE_OTHER)
Admission: RE | Admit: 2010-06-17 | Discharge: 2010-06-17 | Disposition: A | Discharge status: Home / Self Care | Payer: Medicare (Managed Care) | Source: Ambulatory Visit

## 2010-06-17 ENCOUNTER — Emergency Department (HOSPITAL_COMMUNITY): Payer: Medicare (Managed Care)

## 2010-06-17 DIAGNOSIS — R109 Unspecified abdominal pain: Secondary | ICD-10-CM | POA: Insufficient documentation

## 2010-06-17 DIAGNOSIS — I10 Essential (primary) hypertension: Secondary | ICD-10-CM | POA: Insufficient documentation

## 2010-06-17 DIAGNOSIS — N390 Urinary tract infection, site not specified: Secondary | ICD-10-CM | POA: Insufficient documentation

## 2010-06-17 DIAGNOSIS — F329 Major depressive disorder, single episode, unspecified: Secondary | ICD-10-CM | POA: Insufficient documentation

## 2010-06-17 DIAGNOSIS — J45909 Unspecified asthma, uncomplicated: Secondary | ICD-10-CM | POA: Insufficient documentation

## 2010-06-17 DIAGNOSIS — A5903 Trichomonal cystitis and urethritis: Secondary | ICD-10-CM | POA: Insufficient documentation

## 2010-06-17 DIAGNOSIS — R10819 Abdominal tenderness, unspecified site: Secondary | ICD-10-CM | POA: Insufficient documentation

## 2010-06-17 DIAGNOSIS — R6883 Chills (without fever): Secondary | ICD-10-CM | POA: Insufficient documentation

## 2010-06-17 DIAGNOSIS — Z85118 Personal history of other malignant neoplasm of bronchus and lung: Secondary | ICD-10-CM | POA: Insufficient documentation

## 2010-06-17 DIAGNOSIS — Z79899 Other long term (current) drug therapy: Secondary | ICD-10-CM | POA: Insufficient documentation

## 2010-06-17 DIAGNOSIS — R197 Diarrhea, unspecified: Secondary | ICD-10-CM | POA: Insufficient documentation

## 2010-06-17 DIAGNOSIS — F3289 Other specified depressive episodes: Secondary | ICD-10-CM | POA: Insufficient documentation

## 2010-06-17 DIAGNOSIS — R1032 Left lower quadrant pain: Secondary | ICD-10-CM

## 2010-06-17 HISTORY — DX: Malignant (primary) neoplasm, unspecified: C80.1

## 2010-06-17 HISTORY — DX: Essential (primary) hypertension: I10

## 2010-06-17 LAB — CBC
HCT: 38.5 % (ref 36.0–46.0)
Hemoglobin: 12.9 g/dL (ref 12.0–15.0)
MCH: 27.7 pg (ref 26.0–34.0)
MCHC: 33.5 g/dL (ref 30.0–36.0)
MCV: 82.8 fL (ref 78.0–100.0)
Platelets: 308 10*3/uL (ref 150–400)
RBC: 4.65 MIL/uL (ref 3.87–5.11)
RDW: 15.3 % (ref 11.5–15.5)
WBC: 7.3 10*3/uL (ref 4.0–10.5)

## 2010-06-17 LAB — COMPREHENSIVE METABOLIC PANEL
ALT: 16 U/L (ref 0–35)
AST: 20 U/L (ref 0–37)
Albumin: 3.7 g/dL (ref 3.5–5.2)
Alkaline Phosphatase: 123 U/L — ABNORMAL HIGH (ref 39–117)
BUN: 17 mg/dL (ref 6–23)
CO2: 29 mEq/L (ref 19–32)
Calcium: 9.9 mg/dL (ref 8.4–10.5)
Chloride: 101 mEq/L (ref 96–112)
Creatinine, Ser: 0.68 mg/dL (ref 0.4–1.2)
GFR calc Af Amer: >60 mL/min (ref >60)
GFR calc non Af Amer: >60 mL/min (ref >60)
Glucose, Bld: 88 mg/dL (ref 70–99)
Potassium: 4.1 mEq/L (ref 3.5–5.1)
Sodium: 138 mEq/L (ref 135–145)
Total Bilirubin: 0.4 mg/dL (ref 0.3–1.2)
Total Protein: 7.4 g/dL (ref 6.0–8.3)

## 2010-06-17 LAB — URINALYSIS, ROUTINE W REFLEX MICROSCOPIC
Bilirubin Urine: NEGATIVE
Glucose, UA: NEGATIVE mg/dL
Ketones, ur: NEGATIVE mg/dL
Nitrite: NEGATIVE
Protein, ur: NEGATIVE mg/dL
Specific Gravity, Urine: 1.025 (ref 1.005–1.030)
Urobilinogen, UA: 1 mg/dL (ref 0.0–1.0)
pH: 5.5 (ref 5.0–8.0)

## 2010-06-17 LAB — DIFFERENTIAL
Basophils Absolute: 0 10*3/uL (ref 0.0–0.1)
Basophils Relative: 0 % (ref 0–1)
Eosinophils Absolute: 0.4 10*3/uL (ref 0.0–0.7)
Eosinophils Relative: 6 % — ABNORMAL HIGH (ref 0–5)
Lymphocytes Relative: 31 % (ref 12–46)
Lymphs Abs: 2.3 10*3/uL (ref 0.7–4.0)
Monocytes Absolute: 0.9 10*3/uL (ref 0.1–1.0)
Monocytes Relative: 12 % (ref 3–12)
Neutro Abs: 3.7 10*3/uL (ref 1.7–7.7)
Neutrophils Relative %: 51 % (ref 43–77)

## 2010-06-17 LAB — URINE MICROSCOPIC-ADD ON

## 2010-06-17 MED ORDER — IOHEXOL 300 MG/ML  SOLN
100.0000 mL | Freq: Once | INTRAMUSCULAR | Status: AC | PRN
  Administered 2010-06-17: 100 mL via INTRAVENOUS

## 2010-06-19 LAB — URINE CULTURE
Colony Count: >=100000
Culture  Setup Time: 201205022033

## 2010-06-30 NOTE — Assessment & Plan Note (Signed)
OFFICE VISIT   Susan Ingram, Susan Ingram  DOB:  [birth year]                                        July 22, 2008  CHART #:  [id number]   HISTORY OF PRESENT ILLNESS:  This is a 70 year old African American  female who is status post bronchoscopy, left VATS and wedge resection,  and lingular segmentectomy with lymph node dissection by Dr. Dorris Fetch  on May 23, 2008.  Pathology showed poorly differentiated carcinoma.  She was last seen in the office on Jun 17, 2008, and was doing fairly  well at that time.  She does continue to have some left incisional pain,  although it is somewhat less now than previously.  She is scheduled to  return to work on July 23, 2008, but feels she needs a few more days to  recuperate.  She was given a note that she is able to return to work on  Friday July 26, 2008.  She is to contact our office if her incisional  pain worsens or if due to the nature of her work (significant lifting  and moving) she is unable to perform her duties and requires a longer  leave of absence from work.  She will return to see Dr. Dorris Fetch in  the office in about 3 months.   Salvatore Decent Dorris Fetch, M.D.  Electronically Signed   DZ/MEDQ  D:  07/22/2008  T:  07/23/2008  Job:  [id number]

## 2010-06-30 NOTE — Consult Note (Signed)
NEW PATIENT CONSULTATION   Susan Ingram, Susan Ingram  DOB:  [birth year]                                        May 09, 2008  CHART #:  [id number]   REASON FOR CONSULTATION:  Left lung nodule.   HISTORY OF PRESENT ILLNESS:  The patient is a 70 year old woman with a  past history of tobacco abuse, who was found last May to have a 7-mm  lingular nodule on CT scan.  She had a followup CT done recently, which  showed an increase in size from 7 to 16.5 mm.  She subsequently had a  PET scan which showed markedly hypermetabolic left upper lobe nodule.  There was no mediastinal or hilar activity and no evidence of distant  metastases.  The patient does have a history of COPD.  She has an  inhaler, but seldom has to use it.  She has not had any exertional chest  pain or shortness of breath.  She does have approximately a 70 pack-year  history of smoking, 2 packs a day for 35 years.  She quit 15 years ago.   PAST MEDICAL HISTORY:  Significant for hypertension,  hypercholesterolemia, COPD, positive PPD, and eczema.   Her current medications are amlodipine 10 mg daily, furosemide 20 mg  daily, and Ventolin 2 puffs p.r.n.   She has no known drug allergies.   FAMILY HISTORY:  Unremarkable.   SOCIAL HISTORY:  She is single.  She has one child.  She still works in  Nurse, learning disability at [employer].  She did smoke 2 packs a day for 35 years  before quitting 15 years ago.   REVIEW OF SYSTEMS:  See patient medical history form, is reviewed and is  on the chart.  She complains of dizziness which she last had about 2  months ago.  She says she occasionally has it, but nothing recent.  No  previous history of cancer.  She does have a history of asthma and  wheezing, but seldom has to use an inhaler.  She specifically denies  anginal-type chest pain or shortness of breath with exertion.  Also  specifically denies orthopnea, paroxysmal nocturnal dyspnea, and  peripheral edema.  All other  systems are negative.   PHYSICAL EXAMINATION:  GENERAL:  The patient is a 70 year old woman in  no acute distress.  She is overweight.  VITAL SIGNS:  Her blood pressure is 154/87, pulse 70, respirations are  20, and her oxygen saturation is 99% on room air.  She is 5 feet 6  inches tall, 191 pounds.  NEUROLOGIC:  She is alert, oriented x3 with no focal deficits.  HEENT:  She is wearing glasses, otherwise unremarkable.  NECK:  Supple without thyromegaly, adenopathy, or bruits.  There is no  cervical, supraclavicular, axillary, epitrochlear adenopathy.  CARDIAC:  Regular rate and rhythm.  Normal S1 and S2.  There is a 2/6  systolic murmur heard at the upper sternal border, left greater than  right.  LUNGS:  Clear with equal breath sounds bilaterally and no wheezing.  EXTREMITIES:  Peripheral pulses are intact.  She has no clubbing,  cyanosis, or edema.  She has brisk capillary refill.  SKIN:  Warm and dry.   LABORATORY DATA:  CT and PET reviewed.  There is a 1.7 cm lingular mass,  which is markedly hypermetabolic with a SUV  of 15.1.  Spirometry was  performed in the office.  Unfortunately due to technical difficulties,  we do not have normalized values, but her FEV-1 is 3.04 which is  certainly adequate to tolerate a pulmonary resection.  Her laboratory  test from October 2009 showed normal CBC with a hematocrit of 41, white  count of 5.2, and platelets of 320.  Her comprehensive metabolic panel  was notable only for an alkaline phosphatase of 122.  Cholesterol panel  had total cholesterol of 164, HDL was 42, and LDL was 102.   IMPRESSION:  The patient is a 70 year old woman with a history of  tobacco abuse.  She has some cardiac risk factors, but no history of  heart disease and no active symptomatology.  She does, however, have an  increasing size of a left lingular mass, which is first noted about 10  months ago on a CT scan.  In the interim, it is increased from 7 mm to  1.6 cm.   This is hypermetabolic on PET scan and is consistent with  primary lung cancer.  This needs to be resected.  I discussed in detail  with the patient, the indications, risks, benefits, and alternative  treatments.  She understands the operative approach to be proceed with  bronchoscopy followed by left VATS with minithoracotomy, excisional  biopsy of the mass followed by either lobectomy or segmentectomy  depending on the anatomy.  She understands that we will attempt to do  this with minimally invasive approach, but there is always a possibility  that we may have to extend the incision if necessary.  At the patient's  request, we are going to schedule the surgery for Thursday, May 23, 2008.  She will be admitted on the day of surgery and expected in the  hospital 4-5 days and probably out of work for about 8 weeks.   I discussed in detail with the patient, the risks and benefits of the  procedure.  She understands the risks include but not limited to death,  myocardial infarction, deep vein thrombosis, pulmonary embolism,  bleeding, possible need for transfusions, infections, prolonged air leak  as well as other potential unforeseeable complications.  She understands  and accepts these risks and agrees to proceed.   Salvatore Decent Dorris Fetch, M.D.  Electronically Signed   SCH/MEDQ  D:  05/09/2008  T:  05/10/2008  Job:  [id number]   cc:   Dineen Kid. Reche Dixon, M.D.

## 2010-06-30 NOTE — Assessment & Plan Note (Signed)
OFFICE VISIT   Susan Ingram, Susan Ingram  DOB:  1940-08-27                                        October 28, 2008  CHART #:  [id number]   HISTORY:  The patient is a 70 year old woman who in April 2010 underwent  a lingular segmentectomy and lymph node dissection for a T1 N0 non-small  cell carcinoma.  She returns today for her 20-month followup visit.  The  patient states that she has been feeling very well.  She has not had any  problems with her breathing.  She is not having any pain in her  incisions although she does still itch where her incisions are.  She has  not had any hemoptysis.  No headaches or visual changes and no new bone  or joint pain.   MEDICATIONS:  Currently are amlodipine 10 mg daily, Lasix 20 mg daily,  Ventolin inhalers, and vitamin D.   ALLERGIES:  She has no known drug allergies.   FAMILY OR SOCIAL HISTORY:  No change in family or social history.   REVIEW OF SYSTEMS:  See HPI, otherwise negative.   PHYSICAL EXAMINATION:  The patient is a 70 year old woman in no acute  distress.  Her blood pressure is 137/81, pulse 66, respirations are 18,  her ox saturation is 99% on room air.  Her lungs are clear to  auscultation and percussion with essentially equal breath sounds,  possibly slightly diminished on the left side.  There is no cervical,  supraclavicular, axillary, epitrochlear adenopathy.  Her incisions are  well healed.   IMAGING:  Chest x-ray shows good aeration of the lungs bilaterally.  There is no effusions or infiltrates.  No evidence for recurrent  disease.   IMPRESSION:  The patient is now about 4-1/2 to 5 months out from a  lingular segmentectomy for a stage IA poorly differentiated non-small-  cell carcinoma.  She is doing well at this point in time with no  evidence for recurrent disease.  I will plan on seeing her back in about  4 months.  We will do another plain chest x-ray at that time and then  plan on doing a  CT of the chest at the 1-year anniversary of her  surgery.   Salvatore Decent Dorris Fetch, M.D.  Electronically Signed   SCH/MEDQ  D:  10/28/2008  T:  10/29/2008  Job:  [id number]   cc:   Dineen Kid. Reche Dixon, M.D.  Amy Chamkasem

## 2010-06-30 NOTE — Assessment & Plan Note (Signed)
OFFICE VISIT   KINSLEI, LABINE  DOB:  03/05/1940                                        May 27, 2009  CHART #:  [id number]   HISTORY:  The patient is a 70 year old woman who underwent a lingular  segmentectomy in April 2010 for a T1 non-small-cell carcinoma of the  left upper lobe.  She was last seen in the office in January, at which  time she was doing well.  She now returns for 1-year followup visit.  She says that she does not have any problems related to her incision  other than itching in the area occasionally.  She is not having any  chest pain.  She is still working full time.  She says she gets tired at  the end of the day as she works full time and keeps her grandchildren on  the weekend.  She says that she has not had any cough, hemoptysis, any  unusual bone or joint pain, or any headaches or visual disturbances.   CURRENT MEDICATIONS:  1. Amlodipine 10 mg daily.  2. Furosemide 20 mg daily.  3. Ventolin 2 puffs b.i.d. p.r.n.  4. Calcium plus D 2 tablets daily.   ALLERGIES:  She has no known drug allergies.   REVIEW OF SYSTEMS:  See HPI, otherwise negative.   PHYSICAL EXAMINATION:  The patient is a 70 year old woman in no acute  distress.  Her blood pressure is 141/76, pulse 64, respirations are 16,  oxygen saturation is 98% on room air.  Neurologically, she is alert and  oriented x3 with no focal deficits noted.  Neck is supple without any  thyromegaly or adenopathy.  There is no cervical, supraclavicular,  axillary, epitrochlear adenopathy.  Lungs have equal breath sounds  bilaterally.  Her incisions are well healed.  Cardiac exam has a regular  rate and rhythm.  Normal S1 and S2.   DIAGNOSTIC TESTS:  CT scan of the chest is reviewed.  It shows  postoperative changes.  There is some fullness in the left hilum.  Radiologist feels this likely represents surgical changes and scarring.  There is not a well-defined mass.   IMPRESSION:   The patient is now a year out from surgery with no evidence  of recurrent disease.  She does have this unusual-looking soft tissue  area near the staples in her lungs.  It does not look like a defined  mass or recurrence necessarily but cannot really be ruled out based on  the CT appearance.  I discussed our options.  One option would be to  proceed with a PET scan; however, if there is some active scarring in  that area, it is possible that it could be faintly positive and then  really would not enlighten this.  After discussion with the patient, we  decided to have her return for repeat CT scan in 4 months to look at the  ablation of that area.  If there is increase in size, then she would  need further investigation.   Salvatore Decent Dorris Fetch, M.D.  Electronically Signed   SCH/MEDQ  D:  05/27/2009  T:  05/28/2009  Job:  [id number]   cc:   Dineen Kid. Reche Dixon, M.D.  Onnie Graham, NP

## 2010-06-30 NOTE — Assessment & Plan Note (Signed)
OFFICE VISIT   ANNELIE, BOAK  DOB:  [birth year]                                        September 30, 2009  CHART #:  [id number]   HISTORY:  The patient is a 70 year old woman who had a left lingular  segmentectomy for T1 non-small-cell carcinoma of the left upper lobe in  April 2010.  She was last seen in April 2011 for her 1-year followup  visit, at which time she had no evidence of recurrent disease, and she  now returns for a 36-month followup visit.  The patient states that she  has been doing well.  She really only notices pain from her incision  when she bends over to tie her shoes and she gets a sharp, fleeting  pains.  She is not having to take anything for that.  She does not have  any pain with breathing.  She not had any difficulty with her breathing.  She has no cough, hemoptysis, wheezing.  She has not had any headaches  or visual changes or unusual bone or joint pains.   CURRENT MEDICATIONS:  1. Furosemide 40 mg daily.  2. Amlodipine 10 mg daily.  3. Ventolin 2 puffs p.r.n.  4. Calcium 600 plus D 2 tablets daily.  5. Klor-Con 10 mEq daily.   ALLERGIES:  She has no known drug allergies.   REVIEW OF SYSTEMS:  See HPI, otherwise negative.   PHYSICAL EXAMINATION:  The patient is a 70 year old woman in no acute  distress.  Blood pressure is 130/81, pulse 58, respirations 18, her  oxygen saturation is 99% on room air.  Lungs are clear with equal breath  sounds bilaterally.  Cardiac exam has a regular rate and rhythm.  Normal  S1 and S2.  No rubs, murmurs, or gallops.  There is no cervical or  supraclavicular adenopathy.   LABORATORY DATA:  Chest CT in comparison to her film from April shows  stable scarring.  The soft tissue in the left hilum is better defined  with air bronchograms.  There is no mass seen.  There is no adenopathy.   IMPRESSION:  The patient is a 70 year old woman.  She is now 16 months  out from a lingular segmentectomy  for a T1 N0 non-small-cell carcinoma  with no evidence of recurrent disease.  I will plan to see her back in  about 3 or 4 months with a repeat CT scan and then we will see her again  for 2-year followup, and after that we will go to every 15-month visits.   Salvatore Decent Dorris Fetch, M.D.  Electronically Signed   SCH/MEDQ  D:  09/30/2009  T:  10/01/2009  Job:  [id number]   cc:   Dineen Kid. Reche Dixon, M.D.  Onnie Graham, NP

## 2010-06-30 NOTE — Discharge Summary (Signed)
NAMECUMA, Susan Ingram            ACCOUNT NO.:  [id number]   MEDICAL RECORD NO.:  [id number]          PATIENT TYPE:  INP   LOCATION:  3307                         FACILITY:  [facility]   PHYSICIAN:  Salvatore Decent. Dorris Fetch, M.D.DATE OF BIRTH:  03/09/1940   DATE OF ADMISSION:  05/23/2008  DATE OF DISCHARGE:                               DISCHARGE SUMMARY   FINAL DIAGNOSIS:  Left lingular nodule, T1 non-small cell carcinoma of  the left upper lobe.  Final pathology pending.   SECONDARY DIAGNOSES:  1. Hypertension.  2. Hypercholesterolemia.  3. Chronic obstructive pulmonary disease.  4. Positive purified protein derivative.  5. History of eczema.   IN-HOSPITAL OPERATIONS AND PROCEDURES:  Bronchoscopy, left video-  assisted thoracoscopic surgery with wedge resection, lingular  segmentectomy and lymph node dissection.   HISTORY AND PHYSICAL AND HOSPITAL COURSE:  Susan Ingram is a 70-year-  old female with a past medical history of smoking and history of COPD.  She was found to have a 16.5-mm nodule in her lingula on a recent CT  scan.  PET scan showed the lesion to be hypermetabolic.  There is no  medial sign or hilar activity nor was there any evidence of distant  metastasis.  The patient's primary functions were adequate to tolerate  resection.  She was seen and evaluated by Dr. Dorris Fetch.  Dr.  Dorris Fetch discussed with the patient undergoing resection of this  nodule.  He discussed the risks and benefits with the patient.  The  patient acknowledged understanding and agreed to proceed.  Surgery was  scheduled for May 23, 2008.  For details of the patient's past medical  history and physical exam, please see dictated H and P.   The patient was taken to the operating room on May 23, 2008, where she  underwent bronchoscopy, left video-assisted thoracoscopic surgery with  wedge resection, lingular segmentectomy and lymph node dissection.  The  patient tolerated this procedure well  and transferred to the intensive  care unit in stable condition.  Her frozen pathology showed T1 non-small  cell carcinoma of the left upper lobe.  Final pathology currently  pending.  The patient's postoperative course is pretty much  unremarkable.  Vital signs are monitored postoperatively and remained  stable.  She was able to be weaned off oxygen sating greater than 90% on  room air.  Daily chest x-ray was obtained.  Postoperatively, the patient  had minimal drainage from the chest tubes.  She had no air leak noted  from chest tube.  Anterior chest tube was discontinued on postop day 1  with posterior chest tube discontinued on postop day 2.  Followup chest  x-ray on postop day 3 showed no pneumothorax and clear.  The patient  continued to use her incentive spirometer.  The patient's hemoglobin and  hematocrit was monitored postoperatively and remained stable.  She was  out of bed ambulating well without difficulty.  She was tolerating diet  well.  No nausea or vomiting noted.   On postop day 3, May 26, 2008, the patient vital signs noted to be  stable.  Most recent lab work shows  white count 12.2, hemoglobin of  11.3, hematocrit 33.7, and platelet count 214.  Sodium of 133, potassium  of 4.0, chloride of 100, bicarbonate 27, BUN of 5, creatinine of 1.69,  and glucose of 137.  The patient is tentatively ready for discharge home  in the a.m. pending x-ray remained stable.   FOLLOWUP APPOINTMENTS:  A followup appointment will be arranged with Dr.  Dorris Fetch within 3 weeks with a chest x-ray.  Our office will contact  the patient with this information.  The patient also need to see her  nurse in 1 week for suture removal.  Our office will again contact her  this information.   ACTIVITY:  The patient instructed no driving until she is released to do  so.  No heavy lifting over 10 pounds.  She is told to ambulate 3-4 times  per day, progress as tolerated, and continue her breathing  exercises.   INCISIONAL CARE:  The patient is told to shower, washing her incisions  using soap and water.  She is to contact the office if she develops any  drainage or openings from any of her incision sites.   DISCHARGE DIET:  The patient was educated on diet; to be low-fat, low-  salt.   DISCHARGE MEDICATIONS:  1. Lasix 20 mg daily.  2. Norvasc 10 mg daily.  3. Ventolin inhaler p.r.n.  4. Percocet 5/325 mg 1-2 tablets q.4-6 h. p.r.n.      Sol Blazing, PA      Salvatore Decent. Dorris Fetch, M.D.  Electronically Signed    KMD/MEDQ  D:  05/26/2008  T:  05/27/2008  Job:  [id number]

## 2010-06-30 NOTE — Op Note (Signed)
Susan Ingram, Susan Ingram            ACCOUNT NO.:  [id number]   MEDICAL RECORD NO.:  [id number]          PATIENT TYPE:  INP   LOCATION:  3307                         FACILITY:  [facility]   PHYSICIAN:  Salvatore Decent. Dorris Fetch, M.D.DATE OF BIRTH:  18-Nov-1940   DATE OF PROCEDURE:  DATE OF DISCHARGE:                               OPERATIVE REPORT   PREOPERATIVE DIAGNOSIS:  Left lingular nodule.   POSTOPERATIVE DIAGNOSIS:  T1 non-small cell carcinoma of the left upper  lobe.   PROCEDURE:  Bronchoscopy, left video-assisted thoracoscopy, wedge  resection, lingular segmentectomy, lymph node dissection.   SURGEON:  Salvatore Decent. Dorris Fetch, MD   ASSISTANT:  Doree Fudge, PA   ANESTHESIA:  General.   FINDINGS:  Approximately 1.5-2 cm mass of the lingula.  Frozen section  revealed non-small cell carcinoma.  Lingular segmentectomy performed  with harvesting multiple level 10, 11, and 12 lymph nodes all of which  were grossly within normal limits, but slightly enlarged bronchial  margin negative for tumor.   CLINICAL NOTE:  Ms. Steinhauser is a 70 year old woman with a past history  of smoking and a history of COPD.  She was found to have a 16.5-mm  nodule in her lingula on a recent CT scan.  The PET scan showed the  lesion to be hypermetabolic.  There was no mediastinal or hilar activity  nor was there any evidence of distant metastases.  The patient's  pulmonary function was adequate to tolerate resection.  She was advised  to undergo bronchoscopy followed by VATS with excisional biopsy of the  mass followed by either lobectomy or segmentectomy if this were positive  for cancer.  The indications, risks, benefits, and alternatives were  detailed with the patient.  She understood and accepted risks and agreed  to proceed.   OPERATIVE NOTE:  Ms. Carmon was brought to the preop holding area on  May 23, 2008.  There, lines were placed for central venous access and  arterial blood pressure  monitoring by Anesthesia.  Intravenous  antibiotics were administered.  PAS hose were placed for DVT  prophylaxis.  The patient was taken to the operating room, anesthetized,  and intubated.  Flexible fiberoptic bronchoscopy was carried out via the  endotracheal tube that revealed normal endobronchial anatomy and no  intrabronchial lesions.  There were minimal secretions.  A Foley  catheter was placed.   The patient was placed in a right lateral decubitus position, and the  left chest which had been marked prior to surgery was prepped and draped  in the usual sterile fashion.  Single-lung ventilation of the right lung  was carried out.  An incision was made at approximately the seventh  intercostal space in the midaxillary line and was carried through the  skin and subcutaneous tissue.  The chest was entered bluntly using  hemostat.  The port was inserted, and the thoracoscope was placed in the  port.  There was no cross ventilation, but the lung was relatively slow  to deflate.  Suction was applied to the left side of the double-lumen  endotracheal tube, and there was good deflation of the lung  with no  cross ventilation.  The patient tolerated single-lung ventilation well  throughout the procedure.  A small minithoracotomy approximately 6 cm in  length was made in the anterolateral chest wall and was carried through  the subcutaneous tissue.  Latissimus was divided.  The serratus was  separated, and the intercostal muscles were divided.  The patient had a  very small rib space.  Minimal spreading of the ribs was performed with  the laminar spreader to allow palpation of the lung.  A separate  incision was made posteriorly below the tip of the scapula for  instrumentation.  The nodule was identified in the lingula.  A wedge  resection was performed with multiple firings of an Endo-GIA stapler  with 4.5-mm staple depth.  Specimen was removed.  There was no pleural  involvement.  It was  sent for a frozen section which subsequently  returned non-small cell carcinoma.   The fissure then was dissected out.  The patient had a relatively  complete fissure.  The lingular arterial branches were identified.  Distally, there were 3 lingular branches that came off at a closely  approximated group.  These were isolated, divided with an ATW 35  stapler.  During the dissection of these vessels, multiple lymph nodes  were encountered.  These were level 11 lymph nodes which were sent for  pathology separately.  The level 10 nodes also were identified during  dissection and were sent as separate specimens for pathology as well.   The hilar reflection then was divided medially exposing the pulmonary  vein as additional nodes were found around the bronchus.  These were  taken off.  The left upper lobe bronchus was identified as well as the  segmental bronchus to the lingula.  This was divided with the Endo-GIA  stapler with 3.5-mm staple depth.  The pulmonary venous branches then  were isolated and divided with an ATW stapler.  At this point, the lung  was briefly inflated which had also been tested prior to dividing the  lingular bronchus to ensure the remainder of the left upper lobe  inflated.  The segmentectomy then was completed with sequential firings  of an Echelon 60 stapler.  The specimen was removed in 2 pieces and sent  for frozen section as a bronchial margin which returned negative for  cancer.  No additional lymph nodes were encountered.  The chest was  copiously irrigated with warm saline.  The test inflation revealed no  leakage from the bronchial stump.  A 28-French chest tube was placed  anteriorly, and a 36-French chest tube was placed posteriorly.  An On-Q  local anesthetic catheter was tunneled in a subpleural location  posteriorly.  The lung then was reinflated.  The remaining port site was  closed with a 0 Vicryl fascial suture and a 3-0 Vicryl subcuticular   suture.  The remaining thoracotomy was closed with a 0 Vicryl running  suture to reapproximate the muscular layers, a 2-0 Vicryl subcutaneous  suture, and a 3-0 Vicryl subcuticular suture.  The patient was then  placed back in a supine position.  She was extubated in the operating  room and taken to the recovery room in stable condition.  All sponge,  needle, and instrument counts were correct at the end of the procedure.  There were no intraoperative complications.      Salvatore Decent Dorris Fetch, M.D.  Electronically Signed     SCH/MEDQ  D:  05/23/2008  T:  05/24/2008  Job:  [id number]   cc:   Dineen Kid. Reche Dixon, M.D.

## 2010-06-30 NOTE — Assessment & Plan Note (Signed)
OFFICE VISIT   RAINN, ZUPKO  DOB:  Oct 28, 1940                                        January 05, 2010  CHART #:  [id number]   HISTORY:  The patient is a 70 year old woman who had a lingular  segmentectomy for a T1 non-small cell carcinoma of the left upper lobe  in April 2010.  She was last seen in the office in August, at which  time, she was about 16 months out from surgery with no evidence of  recurrent disease.  She says that she is not really having any pain in  her incisions.  She does have some itching there at times.  She has not  had any difficulty with her breathing.  She has not had any new medical  issues arise since her last visit.   PAST MEDICAL HISTORY:  Significant for non-small cell carcinoma status  post lingular segmentectomy, hypertension, hypercholesterolemia, chronic  obstructive pulmonary disease, and history of eczema.   CURRENT MEDICATIONS:  1. Lasix 40 mg daily.  2. Calcium plus D 2 tablets daily.  3. Potassium 10 mEq daily.  4. Lisinopril 20 mg daily.  5. Ventolin 2 puffs p.r.n.   ALLERGIES:  She has an allergy to amlodipine which causes leg and ankle  swelling.   REVIEW OF SYSTEMS:  See HPI.  All systems are negative.   PHYSICAL EXAMINATION:  General:  The patient is a 70 year old woman, in  no acute distress.  Vital Signs:  Her blood pressure is 150/78, pulse  68, respirations are 16, and her oxygen saturation is 98% on room air.  Lungs:  Clear with equal breath sounds bilaterally.  Cardiac:  Regular  rate and rhythm.  Normal S1 and S2.  No rubs, murmurs, or gallops.  She  has no cervical, supraclavicular, axillary, or epitrochlear adenopathy.  Her incisions are well healed.   DIAGNOSTIC TESTS:  CT of the chest is reviewed and compared to the scan  from August of this year, which shows no interval change and no evidence  of recurrent disease.   IMPRESSION:  The patient is a 70 year old woman status post  lingular  segmentectomy.  She is doing well at this point in time with no evidence  of recurrent disease.  I will plan to see her back in April 2012, and  that will be her 2-year followup visit.  We will do a CT at that time  and then annually thereafter.   Salvatore Decent Dorris Fetch, M.D.  Electronically Signed   SCH/MEDQ  D:  01/05/2010  T:  01/06/2010  Job:  [id number]   cc:   Dineen Kid. Reche Dixon, M.D.

## 2010-06-30 NOTE — Assessment & Plan Note (Signed)
OFFICE VISIT   SENTORIA, BRENT  DOB:  11/02/1940                                        March 10, 2009  CHART #:  [id number]   HISTORY OF PRESENT ILLNESS:  The patient is a 70 year old woman who  underwent lingular segmentectomy for a T1 N0 non-small cell carcinoma in  April 2010.  She now returns for her 69-month followup visit.  She states  she has had a cold over the past few days.  Prior to that, she had been  doing well.  She has not had any hemoptysis.  She has had a cough which  is mostly nonproductive.  She has not had any wheezing.  No new or  unusual bone or joint pain.  No visual changes or other new neurologic  symptoms.   CURRENT MEDICATIONS:  1. Amlodipine 10 mg daily.  2. Furosemide 20 mg daily.  3. Ventolin inhalers p.r.n.  4. Vitamin D2 weekly.   ALLERGIES:  She has known drug allergies.   PHYSICAL EXAMINATION:  The patient is a 70 year old woman in no acute  distress.  Her blood pressure is 154/90, pulse 66, respirations are 18,  her ox saturation is 98% on room air.  Lungs are clear with equal breath  sounds bilaterally.  Her wounds are well healed.  She has no cervical,  supraclavicular, axillary, or epitrochlear adenopathy.   DIAGNOSTIC TESTS:  Chest x-ray shows good aeration of the lungs.  There  are postoperative changes on the left but otherwise unremarkable.  There  is no evidence of recurrent disease.   IMPRESSION:  The patient is a 70 year old woman.  She is now 8 months  out from a segmentectomy for stage IA T1 N0 non-small cell carcinoma.  She has no evidence of recurrent  disease at this time.  We will plan to see her back in April, that will  be a 1-year followup visit.  We will do a CT of the chest at that time.  She will call if she has any issues in the meantime.   Salvatore Decent Dorris Fetch, M.D.  Electronically Signed   SCH/MEDQ  D:  03/10/2009  T:  03/10/2009  Job:  [id number]   cc:   Dineen Kid. Reche Dixon, M.D.  Onnie Graham, NP

## 2010-06-30 NOTE — Assessment & Plan Note (Signed)
OFFICE VISIT   Susan Ingram, Susan Ingram  DOB:  1940/10/16                                        Jun 17, 2008  CHART #:  [id number]   The patient is a 70 year old woman, who recently underwent a VATS  lingular segmentectomy for a T1, N0 non-small cell carcinoma that was  done on May 23, 2008.  Postoperatively her course was unremarkable.  Her final pathology showed poorly differentiated carcinoma.  There was  no angiolymphatic invasion or pleural involvement.  Margins were clear  and her nodes were negative as well.  She had a preop PET scan which  showed no evidence of nodal involvement in her distant metastases.  The  patient returns today, says she has been doing well.  She does still  have a little bit of discomfort, but she has been basically doing the  house works.  She has been cooking, mopping, using the vacuum cleaner  without any significant difficulty.  She is still taking a Percocet  occasionally for pain.  She has not had any problems with her breathing.   CURRENT MEDICATIONS:  Amlodipine 10 mg daily, Lasix 20 mg daily, and  Ventolin inhaler 2 puffs p.r.n.   ALLERGIES:  She has no known drug allergies.   PHYSICAL EXAMINATION:  GENERAL:  The patient is a 70 year old woman in  no acute distress.  VITAL SIGNS:  Her blood pressure is 142/86, pulse 72, respirations are  18, and oxygen saturation is 98% on room air.  LUNGS:  Equal breath sounds bilaterally.  CARDIAC:  Regular rate and rhythm.  Normal S1 and S2.  SKIN:  Her incisions are well healed with no signs of infection.   Her chest x-ray shows good aeration of the lungs.  There is volume loss  postoperatively as expected on the left side, but otherwise  unremarkable.   IMPRESSION:  The patient is doing very well at this point in time.  She  is status post lingular segmentectomy for stage IA non-small cell  carcinoma.  We will plan to see her every 4 months for the first couple  of years and  then at 53-month interval thereafter.  We will plan to do a  CT scan in 3 months.  I did give her a prescription for additional  Percocet 5/325 one to two t.i.d. p.r.n. #40 tablets, no refills.  I did  talk to her about returning to work.  Her work does involve some  significant lifting and moving.  I think she is probably at least  another month away from being able to return to work, so we have set up  tentative date of Monday July 22, 2008, for being able to return.  She  will contact us if she feels she is not ready at that time.  I will  again plan to see her back in about 3 months for a followup visit.   Salvatore Decent Dorris Fetch, M.D.  Electronically Signed   SCH/MEDQ  D:  06/17/2008  T:  06/18/2008  Job:  [id number]   cc:   Dineen Kid. Reche Dixon, M.D.  Dr. Rickard Rhymes

## 2010-07-03 NOTE — Op Note (Signed)
NAME:  Susan Ingram, Susan Ingram                      ACCOUNT NO.:  [id number]   MEDICAL RECORD NO.:  [id number]                   PATIENT TYPE:  OIB   LOCATION:  5735                                 FACILITY:  [facility]   PHYSICIAN:  Angelia Mould. Derrell Lolling, M.D.             DATE OF BIRTH:  Jul 11, 1940   DATE OF PROCEDURE:  10/11/2002  DATE OF DISCHARGE:                                 OPERATIVE REPORT   PREOPERATIVE DIAGNOSIS:  Left thyroid mass.   POSTOPERATIVE DIAGNOSIS:  Follicular adenoma of the thyroid.   OPERATION PERFORMED:  Left thyroid lobectomy, frozen section.   SURGEON:  Angelia Mould. Derrell Lolling, M.D.   ASSISTANT:  Sandria Bales. Ezzard Standing, M.D.   ANESTHESIA:  General.   INDICATIONS FOR PROCEDURE:  The patient is a 70 year old black female who  noticed a lump in her neck recently.  She  has not had any pain or  compressive symptoms.  A CT scan of the neck was performed on July 1 of this  year showing a 7.5 cm solid left thyroid mass with areas of necrosis within  it causing significant tracheal deviation to the right.  No adenopathy was  noted in the neck.  She was sent to me for evaluation.  Fine needle  aspiration cytology showed rare follicular cells, no evidence of malignancy  seen.  Because of the size of the lesion, thyroidectomy was recommended.  She was brought to the hospital electively.   DESCRIPTION OF PROCEDURE:  Following the induction of general endotracheal  anesthesia, the patient's neck was extended and the arms tucked to her side.  The neck was prepped and draped in a sterile fashion.  A transverse curved  incision was made in one of the skin creases about 2 cm above the clavicles.  Dissection was carried down through the subcutaneous tissue and the platysma  muscle.  A couple of venous channels were suture ligated with 3-0 silk  suture ligatures.  The trachea was noted to be deviated to the right.  We  found the midline of the strap muscles and incised them and mobilized  the  strap muscles off of the thyroid gland.  The right lobe of the thyroid gland  was small and contained no nodules.  The left lobe of the thyroid gland was  quite large at least 6 or 7 cm in diameter but it was very smooth.  We  mobilized the left lobe of the thyroid gland up out of its bed.  We divided  some of the inferior pole attachments with electrocautery and vascular  channels were divided with metal clips.  We then isolated the superior pole  vessels quite nicely.  We triply ligated these with 2-0 silk ties and then  divided this. We then very carefully dissected from lateral to medial, the  left thyroid lobe.  The middle thyroid vein was taken as it went onto the  thyroid capsule.  It was controlled  with metal clips and divided.  The  dissection stayed right in the thyroid capsule to avoid any injury to the  recurrent laryngeal nerve.  We never truly saw the nerve but felt we stayed  away from it completely because of the technique of the dissection. Inferior  thyroid artery was identified, controlled with metal clips and divided.  We  took the dissection up onto the anterior wall of the trachea and then off to  the right just a little bit.  We clamped the isthmus of the thyroid with  hemostats and divided the specimen.  Frozen section diagnosis was performed  by Dr. Almyra Free.  She stated this was a follicular lesion, probably benign.  The isthmus of the thyroid was controlled with suture ligatures of 3-0  Vicryl.  The wounds were irrigated with saline.  We had very good  hemostasis.  I placed a piece of Surgicel gauze in the bed of the left  thyroid lobe and observed this for about 10 minutes before closing and it  was completely dry.  Strap muscles were closed in the midline with a running  suture of 3-0 Vicryl.  The platysma muscle was closed with interrupted  sutures of 3-0 Vicryl.  The  skin closed with skin  staples and Steri-Strips.  Clean bandages were placed.  The patient  was  taken to the recovery room in stable condition.  The estimated blood loss  was about 20mL.  Complications were none.  Sponge, needle and instrument  counts were correct.                                                Angelia Mould. Derrell Lolling, M.D.    HMI/MEDQ  D:  10/11/2002  T:  10/11/2002  Job:  [id number]   cc:   Tresa Endo L. Philipp Deputy, M.D.  [phone] [address]  [place] [address]  Fax: [phone]

## 2010-11-10 ENCOUNTER — Encounter: Payer: Self-pay | Admitting: Thoracic Surgery (Cardiothoracic Vascular Surgery)

## 2010-11-10 DIAGNOSIS — E7800 Pure hypercholesterolemia, unspecified: Secondary | ICD-10-CM

## 2010-11-10 DIAGNOSIS — E785 Hyperlipidemia, unspecified: Secondary | ICD-10-CM | POA: Insufficient documentation

## 2010-11-17 ENCOUNTER — Encounter: Payer: Self-pay | Admitting: Thoracic Surgery (Cardiothoracic Vascular Surgery)

## 2010-11-17 ENCOUNTER — Other Ambulatory Visit: Payer: Self-pay | Admitting: Thoracic Surgery (Cardiothoracic Vascular Surgery)

## 2010-11-17 DIAGNOSIS — C3490 Malignant neoplasm of unspecified part of unspecified bronchus or lung: Secondary | ICD-10-CM

## 2010-11-24 ENCOUNTER — Encounter: Payer: Self-pay | Admitting: Thoracic Surgery (Cardiothoracic Vascular Surgery)

## 2010-11-24 ENCOUNTER — Ambulatory Visit (INDEPENDENT_AMBULATORY_CARE_PROVIDER_SITE_OTHER): Payer: Medicare (Managed Care) | Admitting: Thoracic Surgery (Cardiothoracic Vascular Surgery)

## 2010-11-24 ENCOUNTER — Ambulatory Visit
Admission: RE | Admit: 2010-11-24 | Discharge: 2010-11-24 | Disposition: A | Discharge status: Home / Self Care | Payer: Medicare Other | Source: Ambulatory Visit | Attending: Thoracic Surgery (Cardiothoracic Vascular Surgery) | Admitting: Thoracic Surgery (Cardiothoracic Vascular Surgery)

## 2010-11-24 VITALS — BP 139/83 | HR 71 | Temp 97.3°F | Resp 20 | Ht 66.0 in | Wt 192.0 lb

## 2010-11-24 DIAGNOSIS — Z85118 Personal history of other malignant neoplasm of bronchus and lung: Secondary | ICD-10-CM

## 2010-11-24 DIAGNOSIS — C3490 Malignant neoplasm of unspecified part of unspecified bronchus or lung: Secondary | ICD-10-CM

## 2010-11-24 HISTORY — DX: Personal history of other malignant neoplasm of bronchus and lung: Z85.118

## 2010-11-24 NOTE — Progress Notes (Signed)
PCP is No primary provider on file. Referring Provider is Donia Guiles, MD  Chief Complaint  Patient presents with  . Follow-up    6 month f/u with CXR    HPI: [name] is a 70 year old woman who had a lingular segmentectomy 2 half years ago for a stage IA non-small cell carcinoma she was less in the office in April for her two-year followup visit which time a CT scan was done showed no evidence of recurrent disease. She now returns for 6 month followup visit. She states that she had had trouble getting her Ventolin inhaler and that she had some problems with wheezing she's been able to now get that medication refilled. Other than that she is really not had a great deal of difficulty with her breathing has been basically of her baseline. Her weight has increased slightly since her last visit. She does not have any new bone or joint pain and no unusual headaches or visual changes.  Past Medical History  Diagnosis Date  . Cancer     lung  . Hypertension   . Hypercholesterolemia   . Eczema     Past Surgical History  Procedure Date  . Bronchoscopy, left video-assisted thoracoscopy, wedge 05/23/2008  . Left thyroid lobectomy, frozen section. 10/11/2002    No family history on file.  Social History History  Substance Use Topics  . Smoking status: Former Smoker    Quit date: 04/15/1993  . Smokeless tobacco: Not on file  . Alcohol Use: No    Current Outpatient Prescriptions  Medication Sig Dispense Refill  . albuterol (PROVENTIL HFA;VENTOLIN HFA) 108 (90 BASE) MCG/ACT inhaler Inhale 2 puffs into the lungs every 6 (six) hours as needed.        . calcium-vitamin D 250-100 MG-UNIT per tablet Take 1 tablet by mouth 2 (two) times daily.        . furosemide (LASIX) 40 MG tablet Take 40 mg by mouth 2 (two) times daily.        . potassium chloride (KLOR-CON) 10 MEQ CR tablet Take 10 mEq by mouth daily.          Allergies  Allergen Reactions  . Amlodipine Swelling    LEG AND  ANKLE    Review of Systems: See history of present illness. All other systems negative  BP 139/83  Pulse 71  Temp 97.3 F (36.3 C)  Resp 20  Ht 5\' 6"  (1.676 m)  Wt 192 lb (87.091 kg)  BMI 30.99 kg/m2  SpO2 99% Physical Exam: [name] is a 70 year old woman in no acute distress, she's well-developed well-nourished Her neurologically she is alert and oriented x3 with no focal deficits Lungs are clear with equal breath sounds bilaterally no reels or wheezing No cervical supravesicular axillary or epitrochlear adenopathy Cardiac exam is regular rate and rhythm normal S1 and S2 there is a systolic murmur   Diagnostic Tests: Chest x-ray shows postoperative changes no evidence of recurrent disease  Impression: [name] is a 70 year old woman who is now 2-1/2 years out from a lingular segmentectomy for a stage IA non-small cell carcinoma. She has no evidence of recurrence  at this time. I will plan to see her back in 6 months with a CT of the chest for three-year followup visit  Plan: Return in 6 months, CT of chest for three-year followup after resection of lung cancer.  Call anytime if any concerns with which we can be of assistance

## 2010-12-18 ENCOUNTER — Inpatient Hospital Stay (INDEPENDENT_AMBULATORY_CARE_PROVIDER_SITE_OTHER)
Admission: RE | Admit: 2010-12-18 | Discharge: 2010-12-18 | Disposition: A | Discharge status: Home / Self Care | Payer: Medicare (Managed Care) | Source: Ambulatory Visit | Attending: Family Medicine | Admitting: Family Medicine

## 2010-12-18 DIAGNOSIS — H6120 Impacted cerumen, unspecified ear: Secondary | ICD-10-CM

## 2011-01-18 ENCOUNTER — Other Ambulatory Visit (HOSPITAL_COMMUNITY): Payer: Self-pay | Admitting: Family Medicine

## 2011-01-27 ENCOUNTER — Ambulatory Visit (HOSPITAL_COMMUNITY): Payer: Medicare (Managed Care)

## 2011-01-28 ENCOUNTER — Ambulatory Visit (HOSPITAL_COMMUNITY)
Admission: RE | Admit: 2011-01-28 | Discharge: 2011-01-28 | Disposition: A | Discharge status: Home / Self Care | Payer: No Typology Code available for payment source | Source: Ambulatory Visit | Attending: Family Medicine | Admitting: Family Medicine

## 2011-01-28 DIAGNOSIS — Z13820 Encounter for screening for osteoporosis: Secondary | ICD-10-CM | POA: Insufficient documentation

## 2011-01-28 DIAGNOSIS — Z780 Asymptomatic menopausal state: Secondary | ICD-10-CM | POA: Insufficient documentation

## 2011-03-18 ENCOUNTER — Other Ambulatory Visit (HOSPITAL_COMMUNITY): Payer: Self-pay | Admitting: Family Medicine

## 2011-03-18 DIAGNOSIS — Z1231 Encounter for screening mammogram for malignant neoplasm of breast: Secondary | ICD-10-CM

## 2011-04-15 ENCOUNTER — Ambulatory Visit (HOSPITAL_COMMUNITY)
Admission: RE | Admit: 2011-04-15 | Discharge: 2011-04-15 | Disposition: A | Discharge status: Home / Self Care | Payer: Medicare (Managed Care) | Source: Ambulatory Visit | Attending: Family Medicine | Admitting: Family Medicine

## 2011-04-15 DIAGNOSIS — Z1231 Encounter for screening mammogram for malignant neoplasm of breast: Secondary | ICD-10-CM

## 2011-04-21 ENCOUNTER — Other Ambulatory Visit: Payer: Self-pay | Admitting: Thoracic Surgery (Cardiothoracic Vascular Surgery)

## 2011-04-21 DIAGNOSIS — D381 Neoplasm of uncertain behavior of trachea, bronchus and lung: Secondary | ICD-10-CM

## 2011-06-02 ENCOUNTER — Ambulatory Visit: Payer: Medicare (Managed Care) | Admitting: Thoracic Surgery (Cardiothoracic Vascular Surgery)

## 2011-06-02 ENCOUNTER — Other Ambulatory Visit: Payer: Medicare (Managed Care)

## 2011-06-03 ENCOUNTER — Ambulatory Visit
Admission: RE | Admit: 2011-06-03 | Discharge: 2011-06-03 | Disposition: A | Discharge status: Home / Self Care | Payer: Medicare Other | Source: Ambulatory Visit | Attending: Thoracic Surgery (Cardiothoracic Vascular Surgery) | Admitting: Thoracic Surgery (Cardiothoracic Vascular Surgery)

## 2011-06-03 ENCOUNTER — Encounter: Payer: Self-pay | Admitting: Thoracic Surgery (Cardiothoracic Vascular Surgery)

## 2011-06-03 ENCOUNTER — Ambulatory Visit (INDEPENDENT_AMBULATORY_CARE_PROVIDER_SITE_OTHER): Payer: Medicare Other | Admitting: Thoracic Surgery (Cardiothoracic Vascular Surgery)

## 2011-06-03 ENCOUNTER — Ambulatory Visit: Payer: Medicare (Managed Care) | Admitting: Thoracic Surgery (Cardiothoracic Vascular Surgery)

## 2011-06-03 VITALS — BP 140/82 | HR 63 | Resp 16 | Ht 66.0 in | Wt 200.0 lb

## 2011-06-03 DIAGNOSIS — D381 Neoplasm of uncertain behavior of trachea, bronchus and lung: Secondary | ICD-10-CM

## 2011-06-03 DIAGNOSIS — C3410 Malignant neoplasm of upper lobe, unspecified bronchus or lung: Secondary | ICD-10-CM

## 2011-06-03 DIAGNOSIS — Z09 Encounter for follow-up examination after completed treatment for conditions other than malignant neoplasm: Secondary | ICD-10-CM

## 2011-06-03 NOTE — Progress Notes (Signed)
  HPI:  Susan Ingram returns for an annual followup visit. She had a left upper lobe lingular segmentectomy on 05/23/2008 stage IA non-small cell carcinoma. She's been followed since that time with no evidence recurrent disease. She was last in the office in October which time she was doing well. She states she continued to do well since that visit. She still working in [employer]. She's not had any issues related to her breathing. Her weight has been stable. She denies any chest pain or shortness of breath. Has not noted any new bone or joint pain, there have been no changes in her vision or neurologic status.  Past Medical History  Diagnosis Date  . Cancer     lung  . Hypertension   . Hypercholesterolemia   . Eczema     Current Outpatient Prescriptions  Medication Sig Dispense Refill  . albuterol (PROVENTIL HFA;VENTOLIN HFA) 108 (90 BASE) MCG/ACT inhaler Inhale 2 puffs into the lungs every 6 (six) hours as needed.      . calcium-vitamin D 250-100 MG-UNIT per tablet Take 1 tablet by mouth 2 (two) times daily.        . furosemide (LASIX) 40 MG tablet Take 40 mg by mouth daily.       . potassium chloride (KLOR-CON) 10 MEQ CR tablet Take 10 mEq by mouth daily.         Social- still working, former smoker, quit 1995  Physical Exam BP 140/82  Pulse 63  Resp 16  Ht 5\' 6"  (1.676 m)  Wt 200 lb (90.719 kg)  BMI 32.28 kg/m2  SpO2 99%  Diagnostic Tests: CT of the chest shows postoperative changes, there is a low attenuation cyst in the right kidney which is unchanged since 2011. There is no evidence recurrent disease  Impression: 71 year old female now 3 years out from a lingular segmentectomy for a stage IA non-small cell carcinoma. She has no evidence recurrent disease. She'll need to be followed out to 5 years.  Plan: Return in 6 months with a CT of the chest

## 2011-10-03 ENCOUNTER — Emergency Department (INDEPENDENT_AMBULATORY_CARE_PROVIDER_SITE_OTHER)
Admission: EM | Admit: 2011-10-03 | Discharge: 2011-10-03 | Disposition: A | Discharge status: Home / Self Care | Payer: Medicare Other | Source: Home / Self Care

## 2011-10-03 ENCOUNTER — Encounter (HOSPITAL_COMMUNITY): Payer: Self-pay

## 2011-10-03 DIAGNOSIS — H5789 Other specified disorders of eye and adnexa: Secondary | ICD-10-CM

## 2011-10-03 DIAGNOSIS — H109 Unspecified conjunctivitis: Secondary | ICD-10-CM

## 2011-10-03 MED ORDER — POLYMYXIN B-TRIMETHOPRIM 10000-0.1 UNIT/ML-% OP SOLN: 1.0000 [drp] | OPHTHALMIC | Status: DC

## 2011-10-03 NOTE — ED Notes (Signed)
Pt has painful rt eye that was draining this am.  Some sensitivity to light.

## 2011-10-03 NOTE — ED Provider Notes (Signed)
History     CSN: [id number]  Arrival date & time 10/03/11  1804   None     Chief Complaint  Patient presents with  . Eye Drainage    (Consider location/radiation/quality/duration/timing/severity/associated sxs/prior treatment) The history is provided by the patient.  Patient reports right eye itching and redness since this morning.  No known injury or foreign body.  States has used normal refresh gtts with no improvement.  Denies previous history of same.  Not diabetic, no previous eye surgery/trauma, denies cataracts or glaucoma. No floaters or flashing lights No vision loss + blurred vision No eye pain + eyelid itching + tearing No headache/scalp tenderness Does not wear contacts but wear glasses  Past Medical History  Diagnosis Date  . Cancer     lung  . Hypertension   . Hypercholesterolemia   . Eczema     Past Surgical History  Procedure Date  . Bronchoscopy, left video-assisted thoracoscopy, wedge 05/23/2008  . Left thyroid lobectomy, frozen section. 10/11/2002    History reviewed. No pertinent family history.  History  Substance Use Topics  . Smoking status: Former Smoker    Quit date: 04/15/1993  . Smokeless tobacco: Not on file  . Alcohol Use: No    OB History    Grav Para Term Preterm Abortions TAB SAB Ect Mult Living                  Review of Systems  Constitutional: Negative.   HENT: Negative.   Eyes: Positive for discharge, redness, itching and visual disturbance. Negative for photophobia and pain.  Respiratory: Negative.   Cardiovascular: Negative.     Allergies  Amlodipine and Toprol xl  Home Medications   Current Outpatient Rx  Name Route Sig Dispense Refill  . ALBUTEROL SULFATE HFA 108 (90 BASE) MCG/ACT IN AERS Inhalation Inhale 2 puffs into the lungs every 6 (six) hours as needed.    Marland Kitchen CALCIUM CITRATE-VITAMIN D 250-100 MG-UNIT PO TABS Oral Take 1 tablet by mouth 2 (two) times daily.      . FUROSEMIDE 40 MG PO TABS Oral Take  40 mg by mouth daily.     Marland Kitchen POTASSIUM CHLORIDE 10 MEQ PO TBCR Oral Take 10 mEq by mouth daily.      Marland Kitchen POLYMYXIN B-TRIMETHOPRIM 10000-0.1 UNIT/ML-% OP SOLN Right Eye Place 1 drop into the right eye every 4 (four) hours. 10 mL 0    BP 167/90  Pulse 75  Temp 98.5 F (36.9 C) (Oral)  Resp 16  SpO2 98%  Physical Exam  Nursing note and vitals reviewed. Constitutional: She is oriented to person, place, and time. Vital signs are normal. She appears well-developed and well-nourished. She is active and cooperative.  HENT:  Head: Normocephalic.  Right Ear: External ear normal.  Left Ear: External ear normal.  Nose: Nose normal.  Mouth/Throat: Oropharynx is clear and moist. No oropharyngeal exudate.  Eyes: EOM are normal. Pupils are equal, round, and reactive to light. Right eye exhibits discharge. Right eye exhibits no chemosis, no exudate and no hordeolum. Left eye exhibits no chemosis, no discharge, no exudate and no hordeolum. Right conjunctiva is injected. Right conjunctiva has no hemorrhage. No scleral icterus.  Neck: Trachea normal. Neck supple.  Cardiovascular: Normal rate, regular rhythm and normal heart sounds.   Pulmonary/Chest: Effort normal and breath sounds normal.  Neurological: She is alert and oriented to person, place, and time. No cranial nerve deficit or sensory deficit.  Skin: Skin is warm and dry.  Psychiatric: She has a normal mood and affect. Her speech is normal and behavior is normal. Judgment and thought content normal. Cognition and memory are normal.    ED Course  Procedures (including critical care time)  Labs Reviewed - No data to display No results found.   1. Redness of right eye   2. Right conjunctivitis       MDM  Take eye gtts as prescribed.  Apply warm compresses four times daily. Discontinue the use of eye makeup as well as eye lotions and creams because they may be infected, obtain new. Discard your current contact lenses (if applicable) to  decrease re-infection.  Seek follow-up care with an ophthalmologist within 1-2 weeks if the condition is not resolved completely with prescribed management.        Johnsie Kindred, NP 10/03/11 1923

## 2011-10-04 NOTE — ED Provider Notes (Signed)
Medical screening examination/treatment/procedure(s) were performed by non-physician practitioner and as supervising physician I was immediately available for consultation/collaboration.   [facility]; MD   Sharin Grave, MD 10/04/11 1213

## 2011-10-05 ENCOUNTER — Emergency Department (INDEPENDENT_AMBULATORY_CARE_PROVIDER_SITE_OTHER)
Admission: EM | Admit: 2011-10-05 | Discharge: 2011-10-05 | Disposition: A | Discharge status: Home / Self Care | Payer: No Typology Code available for payment source | Source: Home / Self Care | Attending: Emergency Medicine | Admitting: Emergency Medicine

## 2011-10-05 ENCOUNTER — Encounter (HOSPITAL_COMMUNITY): Payer: Self-pay | Admitting: *Deleted

## 2011-10-05 DIAGNOSIS — H109 Unspecified conjunctivitis: Secondary | ICD-10-CM

## 2011-10-05 MED ORDER — TOBRAMYCIN 0.3 % OP OINT: TOPICAL_OINTMENT | Freq: Every day | OPHTHALMIC | Status: AC

## 2011-10-05 NOTE — ED Notes (Signed)
Pt  Reports  Symptoms      Of      Red  And  Irritated  Eyes  They  Are  Watery  As  Well    She  Reports       She  Was  Seen  ucc  2  Days  Ago     And    rx   ANTI BIOTIC  DROPS            She  Reports  Symptoms      Not  Better

## 2011-10-05 NOTE — ED Provider Notes (Signed)
History     CSN: [id number]  Arrival date & time 10/05/11  1654   First MD Initiated Contact with Patient 10/05/11 1656      Chief Complaint  Patient presents with  . Eye Problem    (Consider location/radiation/quality/duration/timing/severity/associated sxs/prior treatment) HPI Comments: Patient presents urgent care today after having been seen 2 days ago where she had a pink eye on the right eye. She started using prescribe antibiotics and describes it now her infection is also her left eye is now her left eye is red. She feels some degree of discomfort and sandy sensation in her eyes. She feels that when she's in her work environment irritation of her eyes tends to worsen. Patient denies any visual changes, eye pain, or recent trauma or injury. Patient denies any headaches, nausea or vertigo. Patient does report increased tearing  Patient is a 71 y.o. female presenting with eye problem. The history is provided by the patient.  Eye Problem  This is a new problem. The current episode started more than 2 days ago. The problem occurs constantly. The problem has been rapidly worsening. There is pain in both eyes. There was no injury mechanism. Associated symptoms include eye redness. Pertinent negatives include no numbness, no blurred vision, no decreased vision, no foreign body sensation, no photophobia and no itching. Treatments tried: Been using sulfa arrived anatomic antibiotics. The treatment provided no relief.    Past Medical History  Diagnosis Date  . Cancer     lung  . Hypertension   . Hypercholesterolemia   . Eczema     Past Surgical History  Procedure Date  . Bronchoscopy, left video-assisted thoracoscopy, wedge 05/23/2008  . Left thyroid lobectomy, frozen section. 10/11/2002    No family history on file.  History  Substance Use Topics  . Smoking status: Former Smoker    Quit date: 04/15/1993  . Smokeless tobacco: Not on file  . Alcohol Use: No    OB History      Grav Para Term Preterm Abortions TAB SAB Ect Mult Living                  Review of Systems  Constitutional: Negative for chills and appetite change.  Eyes: Positive for redness and itching. Negative for blurred vision, photophobia and visual disturbance.  Skin: Negative for itching.  Neurological: Negative for numbness.    Allergies  Amlodipine and Toprol xl  Home Medications   Current Outpatient Rx  Name Route Sig Dispense Refill  . ALBUTEROL SULFATE HFA 108 (90 BASE) MCG/ACT IN AERS Inhalation Inhale 2 puffs into the lungs every 6 (six) hours as needed.    Marland Kitchen CALCIUM CITRATE-VITAMIN D 250-100 MG-UNIT PO TABS Oral Take 1 tablet by mouth 2 (two) times daily.      . FUROSEMIDE 40 MG PO TABS Oral Take 40 mg by mouth daily.     Marland Kitchen POTASSIUM CHLORIDE 10 MEQ PO TBCR Oral Take 10 mEq by mouth daily.      . TOBRAMYCIN SULFATE 0.3 % OP OINT Both Eyes Place into both eyes at bedtime. X 5 days 3.5 g 0    BP 180/82  Pulse 66  Temp 98.2 F (36.8 C) (Oral)  Resp 18  SpO2 99%  Physical Exam  Nursing note and vitals reviewed. Constitutional: She appears well-developed and well-nourished.  Eyes: EOM and lids are normal. Right eye exhibits no chemosis, no discharge and no exudate. Left eye exhibits no chemosis, no discharge and no exudate. Right  conjunctiva is injected. Right conjunctiva has no hemorrhage. Left conjunctiva is injected. Left conjunctiva has no hemorrhage. No scleral icterus.  Abdominal: Soft.  Musculoskeletal: She exhibits no tenderness.  Neurological: She is alert.  Skin: No rash noted. No erythema.    ED Course  Procedures (including critical care time)  Labs Reviewed - No data to display No results found.   1. Conjunctivitis       MDM  Infectious conjunctivitis versus environmental your chemically induced conjunctivitis. I have discontinued the sulfa-based ophthalmic antibiotic eyedrops substituted with tobramycin ointment to be applied at night. Today  patient conjunctival hyperemia also extended to her left eye were initially started just on her right eye. I have encouraged patient to followup with the ophthalmologist if no improvement in the next few days. She is knowledge treatment plan and followup care with the ophthalmologist if no improvement.        Jimmie Molly, MD 10/05/11 2000

## 2011-11-03 ENCOUNTER — Other Ambulatory Visit: Payer: Self-pay | Admitting: Thoracic Surgery (Cardiothoracic Vascular Surgery)

## 2011-11-03 DIAGNOSIS — C3490 Malignant neoplasm of unspecified part of unspecified bronchus or lung: Secondary | ICD-10-CM

## 2011-12-07 ENCOUNTER — Ambulatory Visit: Payer: PRIVATE HEALTH INSURANCE | Admitting: Thoracic Surgery (Cardiothoracic Vascular Surgery)

## 2011-12-07 ENCOUNTER — Other Ambulatory Visit: Payer: PRIVATE HEALTH INSURANCE

## 2011-12-14 ENCOUNTER — Ambulatory Visit: Payer: PRIVATE HEALTH INSURANCE | Admitting: Thoracic Surgery (Cardiothoracic Vascular Surgery)

## 2011-12-21 ENCOUNTER — Inpatient Hospital Stay: Admission: RE | Admit: 2011-12-21 | Payer: PRIVATE HEALTH INSURANCE | Source: Ambulatory Visit

## 2011-12-21 ENCOUNTER — Ambulatory Visit: Payer: Medicare Other | Admitting: Thoracic Surgery (Cardiothoracic Vascular Surgery)

## 2011-12-25 ENCOUNTER — Emergency Department (INDEPENDENT_AMBULATORY_CARE_PROVIDER_SITE_OTHER)
Admission: EM | Admit: 2011-12-25 | Discharge: 2011-12-25 | Disposition: A | Discharge status: Home / Self Care | Payer: Medicare Other | Source: Home / Self Care

## 2011-12-25 ENCOUNTER — Encounter (HOSPITAL_COMMUNITY): Payer: Self-pay

## 2011-12-25 DIAGNOSIS — J329 Chronic sinusitis, unspecified: Secondary | ICD-10-CM

## 2011-12-25 DIAGNOSIS — R51 Headache: Secondary | ICD-10-CM

## 2011-12-25 HISTORY — DX: Unspecified asthma, uncomplicated: J45.909

## 2011-12-25 MED ORDER — HYDROCODONE-ACETAMINOPHEN 5-325 MG PO TABS: 1.0000 | ORAL_TABLET | ORAL | Status: DC | PRN

## 2011-12-25 MED ORDER — AMOXICILLIN 500 MG PO CAPS: 1000.0000 mg | ORAL_CAPSULE | Freq: Two times a day (BID) | ORAL | Status: DC

## 2011-12-25 NOTE — ED Provider Notes (Signed)
History     CSN: [id number]  Arrival date & time 12/25/11  1703   None     Chief Complaint  Patient presents with  . Headache    sinus pressure, congestion, headache    (Consider location/radiation/quality/duration/timing/severity/associated sxs/prior treatment) HPI Comments: 71 year old female presents with a left him he cranial facial pain. She describes a headache in the left temple and left face for 3 days. It is worse when she opens her mouth and when she holds her head down in a dependent position such as looking down to the floor or lying down on her left side with the left face on the pillow.. He started as upper respiratory congestion and nasal congestion. She has had similar headaches in the past but he does have been quite as bad. She denies photophobia nausea or vomiting. Denies problems with vision, speech, hearing, or swallowing. Denies focal neuro symptoms such as weakness or paresthesias. Denies dental pain   Past Medical History  Diagnosis Date  . Cancer     lung  . Hypertension   . Hypercholesterolemia   . Eczema   . Asthma     Past Surgical History  Procedure Date  . Bronchoscopy, left video-assisted thoracoscopy, wedge 05/23/2008  . Left thyroid lobectomy, frozen section. 10/11/2002    No family history on file.  History  Substance Use Topics  . Smoking status: Former Smoker    Quit date: 04/15/1993  . Smokeless tobacco: Not on file  . Alcohol Use: No    OB History    Grav Para Term Preterm Abortions TAB SAB Ect Mult Living                  Review of Systems  Constitutional: Positive for activity change. Negative for fever.  HENT: Positive for congestion, postnasal drip and sinus pressure. Negative for hearing loss, sore throat, facial swelling, mouth sores, trouble swallowing, neck pain and dental problem.   Eyes: Negative for photophobia, pain, discharge and redness.  Respiratory: Negative.   Cardiovascular: Negative for chest pain and  palpitations.  Gastrointestinal: Negative.   Genitourinary: Negative.   Musculoskeletal: Negative.   Skin: Negative.   Neurological: Positive for headaches. Negative for dizziness, tremors, syncope, facial asymmetry, speech difficulty and numbness.  Psychiatric/Behavioral: Negative.     Allergies  Amlodipine; Maxzide; and Toprol xl  Home Medications   Current Outpatient Rx  Name  Route  Sig  Dispense  Refill  . ALBUTEROL SULFATE HFA 108 (90 BASE) MCG/ACT IN AERS   Inhalation   Inhale 2 puffs into the lungs every 6 (six) hours as needed.         Marland Kitchen CALCIUM CITRATE-VITAMIN D 250-100 MG-UNIT PO TABS   Oral   Take 1 tablet by mouth 2 (two) times daily.           Marland Kitchen FINASTERIDE 5 MG PO TABS   Oral   Take 5 mg by mouth daily.         . FUROSEMIDE 40 MG PO TABS   Oral   Take 40 mg by mouth daily.          Marland Kitchen LISINOPRIL 10 MG PO TABS   Oral   Take 10 mg by mouth daily.         Marland Kitchen POTASSIUM CHLORIDE 10 MEQ PO TBCR   Oral   Take 10 mEq by mouth daily.           . AMOXICILLIN 500 MG PO CAPS   Oral  Take 2 capsules (1,000 mg total) by mouth 2 (two) times daily.   40 capsule   0   . HYDROCODONE-ACETAMINOPHEN 5-325 MG PO TABS   Oral   Take 1 tablet by mouth every 4 (four) hours as needed for pain. Take 1/2 to 1 tablet every 4 hours as needed for headache.   6 tablet   0     BP 150/80  Pulse 62  Temp 98.7 F (37.1 C) (Oral)  Resp 16  SpO2 98%  Physical Exam  Nursing note and vitals reviewed. Constitutional: She is oriented to person, place, and time. She appears well-developed and well-nourished. No distress.  HENT:  Head: Normocephalic and atraumatic.  Right Ear: External ear normal.  Left Ear: External ear normal.  Mouth/Throat: Oropharynx is clear and moist. No oropharyngeal exudate.  Eyes: EOM are normal. Pupils are equal, round, and reactive to light.  Neck: Normal range of motion. Neck supple.  Cardiovascular: Normal rate and normal heart sounds.    Pulmonary/Chest: Effort normal and breath sounds normal. No respiratory distress.  Abdominal: Soft. There is no tenderness.  Musculoskeletal: Normal range of motion.  Neurological: She is alert and oriented to person, place, and time. She has normal strength. No cranial nerve deficit or sensory deficit. She exhibits normal muscle tone. GCS eye subscore is 4. GCS verbal subscore is 5. GCS motor subscore is 6.  Skin: Skin is warm and dry.  Psychiatric: She has a normal mood and affect.    ED Course  Procedures (including critical care time)  Labs Reviewed - No data to display No results found.   1. Sinusitis   2. Headache       MDM  Warm compresses to the face. Amoxicillin as directed for 10 days. Norco one half to one tablet every 4 hours when necessary pain Rest. Any new symptoms problems or worsening such as increasing intensity of headache problems with vision speech hearing swallowing numbness or weakness one side of the body or any other changes go to the emergency department immediately.         Hayden Rasmussen, NP 12/25/11 2036

## 2011-12-25 NOTE — ED Notes (Signed)
Pt states the left side of her face hurts , has had sinus congestion and pressure.

## 2011-12-26 NOTE — ED Provider Notes (Signed)
Medical screening examination/treatment/procedure(s) were performed by resident physician or non-physician practitioner and as supervising physician I was immediately available for consultation/collaboration.   Olivier Frayre DOUGLAS MD.    Tuyen Uncapher D Von Quintanar, MD 12/26/11 0914 

## 2011-12-27 ENCOUNTER — Ambulatory Visit
Admission: RE | Admit: 2011-12-27 | Discharge: 2011-12-27 | Disposition: A | Discharge status: Home / Self Care | Payer: Medicare (Managed Care) | Source: Ambulatory Visit | Attending: Thoracic Surgery (Cardiothoracic Vascular Surgery) | Admitting: Thoracic Surgery (Cardiothoracic Vascular Surgery)

## 2011-12-27 DIAGNOSIS — C3490 Malignant neoplasm of unspecified part of unspecified bronchus or lung: Secondary | ICD-10-CM

## 2011-12-28 ENCOUNTER — Ambulatory Visit (INDEPENDENT_AMBULATORY_CARE_PROVIDER_SITE_OTHER): Payer: No Typology Code available for payment source | Admitting: Thoracic Surgery (Cardiothoracic Vascular Surgery)

## 2011-12-28 ENCOUNTER — Encounter: Payer: Self-pay | Admitting: Thoracic Surgery (Cardiothoracic Vascular Surgery)

## 2011-12-28 VITALS — BP 150/88 | HR 62 | Resp 18 | Ht 66.0 in | Wt 188.0 lb

## 2011-12-28 DIAGNOSIS — C3410 Malignant neoplasm of upper lobe, unspecified bronchus or lung: Secondary | ICD-10-CM

## 2011-12-28 NOTE — Progress Notes (Signed)
HPI:  Susan Ingram returns today for a scheduled followup visit. She had a lingular segmentectomy on 05/23/2008 for a stage IA non-small cell carcinoma. She did not require adjuvant therapy. We've been following her since then, currently seeing her every 6 months.  She states that since her last visit she's been doing well. She did have some problems with an eye infection and had to go to the emergency room 3 times. That has finally cleared up. She says her breathing sometimes will bother her and is particularly bad during the summer. She hasn't had any issues with her breathing recently. She has an occasional cough. She denies hemoptysis. Her weight is been stable. She continues to work.  Past Medical History  Diagnosis Date  . Cancer     lung  . Hypertension   . Hypercholesterolemia   . Eczema   . Asthma       Current Outpatient Prescriptions  Medication Sig Dispense Refill  . acetaminophen (TYLENOL) 500 MG tablet Take 500 mg by mouth every 6 (six) hours as needed.      Marland Kitchen albuterol (PROVENTIL HFA;VENTOLIN HFA) 108 (90 BASE) MCG/ACT inhaler Inhale 2 puffs into the lungs every 6 (six) hours as needed.      . calcium-vitamin D 250-100 MG-UNIT per tablet Take 1 tablet by mouth 2 (two) times daily.        . furosemide (LASIX) 40 MG tablet Take 40 mg by mouth daily.       Marland Kitchen lisinopril (PRINIVIL,ZESTRIL) 10 MG tablet Take 10 mg by mouth daily.      . potassium chloride (KLOR-CON) 10 MEQ CR tablet Take 10 mEq by mouth daily.          Physical Exam BP 150/88  Pulse 62  Resp 18  Ht 5\' 6"  (1.676 m)  Wt 188 lb (85.276 kg)  BMI 30.34 kg/m2  SpO2 8% Gen. 71 year old woman in no acute distress Neurologic alert and oriented x3 with no focal deficits HEENT unremarkable No cervical, supraclavicular, or epitrochlear adenopathy Lungs clear with equal breath sounds bilaterally Cardiac regular rate and rhythm normal S1 and S2  Diagnostic Tests: CT of chest 12/27/2011 CT CHEST WITHOUT  CONTRAST  Technique: Multidetector CT imaging of the chest was performed  following the standard protocol without IV contrast.  Comparison: Chest CT 06/03/2011.  Findings:  Mediastinum: Heart size is normal. There is no significant  pericardial fluid, thickening or pericardial calcification. There  is atherosclerosis of the thoracic aorta, the great vessels of the  mediastinum and the coronary arteries, including calcified  atherosclerotic plaque in the the left anterior descending and  right coronary arteries. No pathologically enlarged mediastinal or  hilar lymph nodes. Please note that accurate exclusion of hilar  adenopathy is limited on noncontrast CT scans. Esophagus is  unremarkable in appearance.  Lungs/Pleura: Status post wedge resection in the left upper lobe.  There is no abnormal soft tissue along the suture line to suggest  local recurrence of disease. Chronic pleural thickening along the  lateral aspect of the left lower lobe is unchanged. No suspicious  appearing pulmonary nodules or masses are identified within the  lungs. No acute consolidative airspace disease. No pleural  effusions.  Upper Abdomen: 4.5 cm low attenuation lesion in the upper pole of  the right kidney is unchanged in size, and was previously  characterized as a simple cyst on study 06/17/2010.  Musculoskeletal: There are no aggressive appearing lytic or blastic  lesions noted in the visualized portions  of the skeleton.  IMPRESSION:  1. Status post left upper lobe wedge resection without evidence to  suggest local recurrence of disease or new metastatic disease in  the thorax on today's examination.  2. Atherosclerosis, including two-vessel coronary artery disease.  3. 4.5 cm cyst in the upper pole of the right kidney again noted.   Impression: 71 year old woman now 3-1/2 years out from a lingular segmentectomy for stage IA non-small cell carcinoma. She has no evidence recurrent  disease.   Plan: Return in 6 months with CT of the chest for her 4 year followup visit.

## 2012-02-18 ENCOUNTER — Ambulatory Visit (INDEPENDENT_AMBULATORY_CARE_PROVIDER_SITE_OTHER): Payer: Medicare HMO | Admitting: Family Medicine

## 2012-02-18 ENCOUNTER — Encounter: Payer: Self-pay | Admitting: Family Medicine

## 2012-02-18 VITALS — BP 158/74 | HR 64 | Temp 98.0°F | Ht 66.0 in | Wt 186.0 lb

## 2012-02-18 DIAGNOSIS — I10 Essential (primary) hypertension: Secondary | ICD-10-CM

## 2012-02-18 DIAGNOSIS — Z8639 Personal history of other endocrine, nutritional and metabolic disease: Secondary | ICD-10-CM | POA: Insufficient documentation

## 2012-02-18 DIAGNOSIS — J45909 Unspecified asthma, uncomplicated: Secondary | ICD-10-CM

## 2012-02-18 DIAGNOSIS — E7800 Pure hypercholesterolemia, unspecified: Secondary | ICD-10-CM

## 2012-02-18 DIAGNOSIS — E559 Vitamin D deficiency, unspecified: Secondary | ICD-10-CM

## 2012-02-18 DIAGNOSIS — J069 Acute upper respiratory infection, unspecified: Secondary | ICD-10-CM | POA: Insufficient documentation

## 2012-02-18 DIAGNOSIS — Z23 Encounter for immunization: Secondary | ICD-10-CM

## 2012-02-18 HISTORY — DX: Personal history of other endocrine, nutritional and metabolic disease: Z86.39

## 2012-02-18 MED ORDER — FUROSEMIDE 40 MG PO TABS: 40.0000 mg | ORAL_TABLET | Freq: Every day | ORAL | Status: DC

## 2012-02-18 MED ORDER — LISINOPRIL 20 MG PO TABS: 20.0000 mg | ORAL_TABLET | Freq: Every day | ORAL | Status: DC

## 2012-02-18 MED ORDER — GUAIFENESIN ER 600 MG PO TB12: 600.0000 mg | ORAL_TABLET | Freq: Two times a day (BID) | ORAL | Status: DC

## 2012-02-18 MED ORDER — POTASSIUM CHLORIDE ER 10 MEQ PO TBCR: 10.0000 meq | EXTENDED_RELEASE_TABLET | Freq: Every day | ORAL | Status: DC

## 2012-02-18 NOTE — Patient Instructions (Addendum)
It was nice meeting you today.  I have refilled your medications and prescribed you something for your cough/congestion (Guaifenesin).  I will see you back in 3 months for lab work and to discuss your blood pressure.

## 2012-02-18 NOTE — Progress Notes (Addendum)
Subjective:     Patient ID: Susan Ingram, female   DOB: [birth year], 72 y.o.   MRN: [id number]  HPI 72 year old female presents to establish care.  Susan Ingram has a history of HTN, HLD, Vit D deficiency, and Non-Small Cell Carcinoma of the lung.  Recently, she has had cough/cold symptoms.  1) Cough, URI symptoms - she reports productive cough and runny nose x 2 weeks.  Sputum clear. - Denies fevers, chills, SOB - No associated ear or throat discomfort - Has taken Coricidin HBP for symptoms with some improvement.  2) HTN - Currently on Lasix and Lisinopril  - No reported side effects from medications - ROS: denies CP, SOB, worsening LE edema  3) HLD - Currently on no medications for this  - Last LDL was 76 on lipid panel in 2011  4) Vit D deficiency  - Currently taking Ca-Vit D 250-100 mg-unit - Last 25-Hydroxy Vitamin D level was 25  - ROS: Denies muscle weakness, fatigue  Social history: Prior smoker. Review of Systems See HPI    Objective:   Physical Exam  Filed Vitals:   02/18/12 1426  BP: 158/74  Pulse: 64  Temp: 98 F (36.7 C)   General: well appearing, NAD. HEENT: no pharyngeal erythema, exudate. Neck: supple, no lymphadenopathy appreciated. Heart: RRR, no murmurs, rubs, or gallops. Lungs: CTAB. No rales, rhonchi, or wheeze. Abdomen: soft, nontender, nondistended. Extremities: no cyanosis, clubbing, or edema. Skin: no rashes. Psych: normal mood and affect. Neuro: no focal deficits.     Assessment:      Plan:

## 2012-02-18 NOTE — Assessment & Plan Note (Signed)
Patient currently not at goal (less than 140/90)  Will continue current treatment and consider further pharmacotherapy at next visit.

## 2012-02-18 NOTE — Assessment & Plan Note (Signed)
Patient needs fasting lipid panel or direct LDL (no lab work since 2011)  Will check at next visit and will consider statin following results.

## 2012-02-18 NOTE — Assessment & Plan Note (Signed)
Will continue current treatment.  Will need Vit D level checked. Patient deferred lab work until next visit.

## 2012-02-18 NOTE — Assessment & Plan Note (Signed)
Patient having some difficulty with cough. Prescribed expectorant (Mucinex).

## 2012-04-03 ENCOUNTER — Ambulatory Visit (INDEPENDENT_AMBULATORY_CARE_PROVIDER_SITE_OTHER): Payer: Medicare HMO | Admitting: Family Medicine

## 2012-04-03 ENCOUNTER — Encounter: Payer: Self-pay | Admitting: Family Medicine

## 2012-04-03 VITALS — BP 170/74 | HR 54 | Temp 98.0°F | Ht 66.0 in | Wt 186.7 lb

## 2012-04-03 DIAGNOSIS — M25569 Pain in unspecified knee: Secondary | ICD-10-CM

## 2012-04-03 DIAGNOSIS — M25561 Pain in right knee: Secondary | ICD-10-CM

## 2012-04-03 NOTE — Progress Notes (Signed)
Patient ID: Susan Ingram, female   DOB: 03/21/1940, 72 y.o.   MRN: [id number] Subjective: The patient is a 72 y.o. year old female who presents today for right knee pain.  1. Right knee pain: Patient reports she's been having problems with pain in the posterior aspect of her right knee for a week or 2. It got significantly worse 4 days ago when she was trying to step into a high truck. She does not report any significant swelling of her leg or calf, and does not report that the pain is particularly made worse by walking, standing, or bending her knee. She denies any shortness of breath or chest pain.  Patient's past medical, social, and family history were reviewed and updated as appropriate. History  Substance Use Topics  . Smoking status: Former Smoker    Quit date: 04/15/1993  . Smokeless tobacco: Not on file  . Alcohol Use: No   Objective:  Filed Vitals:   04/03/12 1034  BP: 170/74  Pulse: 54  Temp: 98 F (36.7 C)   Gen: No acute distress Knee: Right knee is mildly tender palpation in the popliteal fossa. Pulses easily palpable. There is no notable swelling. There is no effusion. There is no pain on palpation of the calf. Brief bedside ultrasound demonstrates easily compressible deep veins in the popliteal fossa with blood flow on Doppler and no evidence of Baker's cyst.  Assessment/Plan: Right knee pain of uncertain etiology. Most likely related to arthritis and overuse. I am doubtful that represents DVT. I will have the patient take Tylenol and try compression. If she has not improved in 2 weeks I would plan x-ray of the knee  Please also see individual problems in problem list for problem-specific plans.

## 2012-04-03 NOTE — Patient Instructions (Signed)
It was good to see you today. Continue taking tylenol for your pain. I am giving you a prescription for a compression sleeve for your knee.  Wear it during the day and take it off at night.  If you can't afford it, get an ACE wrap and wrap it around your knee during the day. If you aren't doing better in 2-3 week, come back to see [place] again.

## 2012-04-13 ENCOUNTER — Other Ambulatory Visit: Payer: Self-pay | Admitting: Family Medicine

## 2012-04-13 DIAGNOSIS — Z1231 Encounter for screening mammogram for malignant neoplasm of breast: Secondary | ICD-10-CM

## 2012-04-19 ENCOUNTER — Ambulatory Visit (HOSPITAL_COMMUNITY)
Admission: RE | Admit: 2012-04-19 | Discharge: 2012-04-19 | Disposition: A | Discharge status: Home / Self Care | Payer: Medicare HMO | Source: Ambulatory Visit | Attending: Family Medicine | Admitting: Family Medicine

## 2012-04-19 DIAGNOSIS — Z1231 Encounter for screening mammogram for malignant neoplasm of breast: Secondary | ICD-10-CM | POA: Insufficient documentation

## 2012-04-20 ENCOUNTER — Encounter: Payer: Self-pay | Admitting: Family Medicine

## 2012-05-26 ENCOUNTER — Ambulatory Visit: Payer: Medicare HMO | Admitting: Family Medicine

## 2012-05-26 ENCOUNTER — Ambulatory Visit (INDEPENDENT_AMBULATORY_CARE_PROVIDER_SITE_OTHER): Payer: Medicare HMO | Admitting: Family Medicine

## 2012-05-26 VITALS — BP 153/83 | HR 93 | Ht 66.0 in | Wt 188.2 lb

## 2012-05-26 DIAGNOSIS — M25569 Pain in unspecified knee: Secondary | ICD-10-CM

## 2012-05-26 DIAGNOSIS — M25561 Pain in right knee: Secondary | ICD-10-CM

## 2012-05-26 MED ORDER — DICLOFENAC SODIUM 1 % TD GEL: 4.0000 g | Freq: Four times a day (QID) | TRANSDERMAL | Status: DC | PRN

## 2012-05-26 NOTE — Patient Instructions (Addendum)
It was good seeing you today.  I have prescribed a topical gel for your knee. Please use it as indicated.  I have also order an xray today.  Please follow up with me in 1 month.  At that time we will discuss your knee pain and your fatigue/tiredness.  I will also do some lab work at that time.

## 2012-05-28 DIAGNOSIS — M25569 Pain in unspecified knee: Secondary | ICD-10-CM | POA: Insufficient documentation

## 2012-05-28 NOTE — Assessment & Plan Note (Signed)
Right knee pain of uncertain etiology. Likely osteoarthritis. Given lack of improvement for >1 month, ordering xray today. Patient instructed to continue using compression sleeve and Tylenol.  Also prescribing topical Voltaren gel today. Will re-evaluate in 1 month.

## 2012-05-28 NOTE — Progress Notes (Signed)
Subjective:     Patient ID: Susan Ingram, female   DOB: 05-24-1940, 72 y.o.   MRN: [id number]  HPI Susan Ingram presents today for follow up regarding knee pain.  1) Knee pain - Patient recently seen by Dr. Louanne Ingram on 04/03/12 with right knee pain.  [place] was done at that time and there was no evidence of DVT.  Patient was instructed to take daily Tylenol and use compression sleeve and return for re-evaluation if pain persisted. - Patient continues to report pain, moderate in severity.  Treatment has had little effect on pain. - Pain located in the popliteal fossa of her right knee. - No recent injury, fall - ROS - no other joints effected, no redness, or swelling.  No fever, chills.  Review of Systems General: patient reports fatigue/tiredness.    Objective:   Physical Exam Filed Vitals:   05/26/12 1630  BP: 153/83  Pulse: 93  General: well appearing female in NAD. MSK: Right knee - normal ROM.  Negative McMurray's.  Negative anterior and posterior drawer.  No fullness of the popliteal fossa - no appreciable baker's cyst. No evidence of laxity with varus or valgus stress. No swelling or effusion. No erythema.    Assessment:        Plan:

## 2012-05-29 ENCOUNTER — Ambulatory Visit
Admission: RE | Admit: 2012-05-29 | Discharge: 2012-05-29 | Disposition: A | Discharge status: Home / Self Care | Payer: Medicare HMO | Source: Ambulatory Visit | Attending: Family Medicine | Admitting: Family Medicine

## 2012-05-29 DIAGNOSIS — M25561 Pain in right knee: Secondary | ICD-10-CM

## 2012-06-26 ENCOUNTER — Other Ambulatory Visit: Payer: Self-pay

## 2012-06-26 DIAGNOSIS — C3490 Malignant neoplasm of unspecified part of unspecified bronchus or lung: Secondary | ICD-10-CM

## 2012-07-18 ENCOUNTER — Emergency Department (INDEPENDENT_AMBULATORY_CARE_PROVIDER_SITE_OTHER)
Admission: EM | Admit: 2012-07-18 | Discharge: 2012-07-18 | Disposition: A | Discharge status: Home / Self Care | Payer: Medicare HMO | Source: Home / Self Care | Attending: Emergency Medicine | Admitting: Emergency Medicine

## 2012-07-18 ENCOUNTER — Encounter (HOSPITAL_COMMUNITY): Payer: Self-pay

## 2012-07-18 DIAGNOSIS — J45901 Unspecified asthma with (acute) exacerbation: Secondary | ICD-10-CM

## 2012-07-18 DIAGNOSIS — J45909 Unspecified asthma, uncomplicated: Secondary | ICD-10-CM

## 2012-07-18 DIAGNOSIS — J069 Acute upper respiratory infection, unspecified: Secondary | ICD-10-CM

## 2012-07-18 MED ORDER — METHYLPREDNISOLONE ACETATE 80 MG/ML IJ SUSP
80.0000 mg | Freq: Once | INTRAMUSCULAR | Status: AC
  Administered 2012-07-18: 80 mg via INTRAMUSCULAR

## 2012-07-18 MED ORDER — METHYLPREDNISOLONE ACETATE 80 MG/ML IJ SUSP
INTRAMUSCULAR | Status: AC
  Filled 2012-07-18: qty 1

## 2012-07-18 MED ORDER — AMOXICILLIN 500 MG PO CAPS: 1000.0000 mg | ORAL_CAPSULE | Freq: Three times a day (TID) | ORAL | Status: DC

## 2012-07-18 MED ORDER — PREDNISONE 20 MG PO TABS: 20.0000 mg | ORAL_TABLET | Freq: Two times a day (BID) | ORAL | Status: DC

## 2012-07-18 MED ORDER — GUAIFENESIN-CODEINE 100-10 MG/5ML PO SYRP: 10.0000 mL | ORAL_SOLUTION | Freq: Four times a day (QID) | ORAL | Status: DC | PRN

## 2012-07-18 MED ORDER — IPRATROPIUM BROMIDE 0.02 % IN SOLN
0.5000 mg | Freq: Once | RESPIRATORY_TRACT | Status: AC
  Administered 2012-07-18: 0.5 mg via RESPIRATORY_TRACT

## 2012-07-18 MED ORDER — ALBUTEROL SULFATE (5 MG/ML) 0.5% IN NEBU
5.0000 mg | INHALATION_SOLUTION | Freq: Once | RESPIRATORY_TRACT | Status: AC
  Administered 2012-07-18: 5 mg via RESPIRATORY_TRACT

## 2012-07-18 MED ORDER — ALBUTEROL SULFATE (5 MG/ML) 0.5% IN NEBU
INHALATION_SOLUTION | RESPIRATORY_TRACT | Status: AC
  Filled 2012-07-18: qty 1

## 2012-07-18 MED ORDER — ALBUTEROL SULFATE HFA 108 (90 BASE) MCG/ACT IN AERS: 2.0000 | INHALATION_SPRAY | RESPIRATORY_TRACT | Status: DC | PRN

## 2012-07-18 NOTE — ED Provider Notes (Signed)
Chief Complaint:   Chief Complaint  Patient presents with  . 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 She has had a many year long history of asthma which is mild and intermittent and she controls with as needed doses of albuterol via MDI inhaler. The past week she's had syrup symptoms with cough productive of small amounts of clear sputum, wheezing, chest tightness, and her chest hurts when she coughs. The coughing keeps her awake at night. She's also had nasal congestion and clear rhinorrhea, headache, and sinus pressure. She denies fever, chills, sore throat, or GI symptoms. She's never been hospitalized for asthma, never been in the emergency room, never been on a ventilator.  Review of Systems:  Other than noted above, the patient denies any of the following symptoms. Systemic:  No fever, chills, sweats, fatigue, myalgias, headache, weight loss or anorexia. ENT:  No earache, ear congestion, nasal congestion, sneezing, rhinorrhea, sinus pressure, sinus pain, post nasal drip, or sore throat. Lungs:  No cough, sputum production, or shortness of breath. No chest pain. Skin:  No rash or itching.  PMFSH:  Past medical history, family history, social history, meds, and allergies were reviewed.  No history of allergic rhinitis.  No use of tobacco. She has high blood pressure and had lung surgery for cancer in 2010 at which time she quit smoking. She takes furosemide, lisinopril, and potassium chloride for her blood pressure.  Physical Exam:   Vital signs:  BP 138/96  Pulse 87  Temp(Src) 98.5 F (36.9 C) (Oral)  Resp 16  SpO2 100% General:  Alert, in no distress. Eye:  No conjunctival injection or drainage. Lids were normal. ENT:  TMs and canals were normal, without erythema or inflammation.  Nasal mucosa was clear and uncongested, without drainage.  Mucous membranes were moist.  Pharynx was clear, without exudate or  drainage.  There were no oral ulcerations or lesions. Neck:  Supple, no adenopathy, tenderness or mass. Lungs:  No retractions or use of accessory muscles.  No respiratory distress.  Initially she has diffuse expiratory wheezes on both sides both anteriorly and posteriorly with good air movement and no rales or rhonchi. Heart:  Regular rhythm, without gallops, murmers or rubs. Skin:  Clear, warm, and dry, without rash or lesions.  Course in Urgent Care Center:   She was given a DuoNeb breathing treatment and Depo-Medrol 80 mg IM. Thereafter she felt better and her lungs sounded better with only a few minimal plantar wheezes, good air movement, and no rales or rhonchi. She felt like she could go home at that point.  Assessment:  The primary encounter diagnosis was Asthma attack. A diagnosis of Viral upper respiratory infection was also pertinent to this visit.  Probable asthma attack brought on by viral upper respiratory infection.  Plan:   1.  The following meds were prescribed:   Discharge Medication List as of 07/18/2012  5:11 PM    START taking these medications   Details  !! albuterol (PROVENTIL HFA;VENTOLIN HFA) 108 (90 BASE) MCG/ACT inhaler Inhale 2 puffs into the lungs every 4 (four) hours as needed for wheezing., Starting 07/18/2012, Until Discontinued, Normal    amoxicillin (AMOXIL) 500 MG capsule Take 2 capsules (1,000 mg total) by mouth 3 (three) times daily., Starting 07/18/2012, Until Discontinued, Normal    guaiFENesin-codeine (GUIATUSS AC) 100-10 MG/5ML syrup Take 10 mLs by mouth 4 (four) times  daily as needed for cough., Starting 07/18/2012, Until Discontinued, Print    predniSONE (DELTASONE) 20 MG tablet Take 1 tablet (20 mg total) by mouth 2 (two) times daily., Starting 07/18/2012, Until Discontinued, Normal     !! - Potential duplicate medications found. Please discuss with provider.     2.  The patient was instructed in symptomatic care and handouts were given. 3.  The patient  was told to return if becoming worse in any way, if no better in 3 or 4 days, and given some red flag symptoms such as worsening respiratory distress or fever that would indicate earlier return. 4.  Follow up here if she gets worse in any way or if not getting better in 3 or 4 days.     Reuben Likes, MD 07/18/12 [phone]

## 2012-07-18 NOTE — ED Notes (Signed)
C/o she has had a cough for past few weeks , no relief w OTC medications; reported history of asthma ( no wheezing noted on ascultation)

## 2012-07-25 ENCOUNTER — Ambulatory Visit
Admission: RE | Admit: 2012-07-25 | Discharge: 2012-07-25 | Disposition: A | Discharge status: Home / Self Care | Payer: Medicare HMO | Source: Ambulatory Visit | Attending: Thoracic Surgery (Cardiothoracic Vascular Surgery) | Admitting: Thoracic Surgery (Cardiothoracic Vascular Surgery)

## 2012-07-25 ENCOUNTER — Encounter: Payer: Self-pay | Admitting: Thoracic Surgery (Cardiothoracic Vascular Surgery)

## 2012-07-25 ENCOUNTER — Ambulatory Visit (INDEPENDENT_AMBULATORY_CARE_PROVIDER_SITE_OTHER): Payer: Medicare HMO | Admitting: Thoracic Surgery (Cardiothoracic Vascular Surgery)

## 2012-07-25 ENCOUNTER — Ambulatory Visit: Payer: Medicare HMO | Admitting: Thoracic Surgery (Cardiothoracic Vascular Surgery)

## 2012-07-25 VITALS — BP 162/70 | HR 49 | Resp 20 | Ht 66.0 in | Wt 188.0 lb

## 2012-07-25 DIAGNOSIS — Z85118 Personal history of other malignant neoplasm of bronchus and lung: Secondary | ICD-10-CM

## 2012-07-25 DIAGNOSIS — C3490 Malignant neoplasm of unspecified part of unspecified bronchus or lung: Secondary | ICD-10-CM

## 2012-07-25 NOTE — Progress Notes (Signed)
HPI:  Susan Ingram returns for a scheduled followup visit. She had a lingular segmentectomy for stage IA non-small cell carcinoma in April 2010. She was last seen in the office about 6 months ago at which time she was doing well with no evidence recurrent disease. She now returns for her 4 year followup visit.  She says that last week she was having problems with cough and wheezing. She went to urgent care was diagnosed with bronchitis and asthma. She was started on some inhalers and amoxicillin. Her symptoms have improved since then. Prior to that she was doing well. She's not had any significant weight loss. She did have cough and mucous production with her bronchitis, but those were not a problem prior to that. She's not had any hemoptysis. She's not had any new or unusual headaches or visual changes.  Past Medical History  Diagnosis Date  . Cancer     lung  . Hypertension   . Hypercholesterolemia   . Eczema   . Asthma     Current Outpatient Prescriptions  Medication Sig Dispense Refill  . acetaminophen (TYLENOL) 500 MG tablet Take 500 mg by mouth every 6 (six) hours as needed.      Marland Kitchen albuterol (PROVENTIL HFA;VENTOLIN HFA) 108 (90 BASE) MCG/ACT inhaler Inhale 2 puffs into the lungs every 6 (six) hours as needed.      Marland Kitchen albuterol (PROVENTIL HFA;VENTOLIN HFA) 108 (90 BASE) MCG/ACT inhaler Inhale 2 puffs into the lungs every 4 (four) hours as needed for wheezing.  1 Inhaler  12  . amoxicillin (AMOXIL) 500 MG capsule Take 2 capsules (1,000 mg total) by mouth 3 (three) times daily.  60 capsule  0  . calcium-vitamin D 250-100 MG-UNIT per tablet Take 1 tablet by mouth 2 (two) times daily.        . diclofenac sodium (VOLTAREN) 1 % GEL Apply 4 g topically 4 (four) times daily as needed. For 1 month.  100 g  0  . furosemide (LASIX) 40 MG tablet Take 1 tablet (40 mg total) by mouth daily.  30 tablet  6  . guaiFENesin (MUCINEX) 600 MG 12 hr tablet Take 1 tablet (600 mg total) by mouth 2 (two)  times daily.  30 tablet  0  . guaiFENesin-codeine (GUIATUSS AC) 100-10 MG/5ML syrup Take 10 mLs by mouth 4 (four) times daily as needed for cough.  240 mL  0  . lisinopril (PRINIVIL,ZESTRIL) 20 MG tablet Take 1 tablet (20 mg total) by mouth daily.  30 tablet  6  . potassium chloride (K-DUR) 10 MEQ tablet Take 1 tablet (10 mEq total) by mouth daily.  30 tablet  6   No current facility-administered medications for this visit.    Physical Exam BP 162/70  Pulse 49  Resp 20  Ht 5\' 6"  (1.676 m)  Wt 188 lb (85.276 kg)  BMI 30.36 kg/m2  SpO46 24% 72 year old woman in no acute distress Well-developed well-nourished  alert and oriented x3 with no focal neurologic deficits No cervical or subclavicular adenopathy Lungs clear bilaterally, no wheezing or rhonchi Cardiac regular rate and rhythm normal S1 and S2, 2/6 systolic murmur  Diagnostic Tests: CT of chest 07/25/2012 *RADIOLOGY REPORT*  Clinical Data: History of lung cancer. Evaluate for recurrence.  CT CHEST WITHOUT CONTRAST  Technique: Multidetector CT imaging of the chest was performed  following the standard protocol without IV contrast.  Comparison: CT chest 12/27/2011, 06/03/2011, 05/18/2010,  05/27/2009, 05/01/2008  Findings:  Mediastinum: Normal heart size. Coronary artery atherosclerotic  calcifications in the left anterior descending and right coronary  arteries noted. No lymphadenopathy identified in the  supraclavicular, mediastinal, hilar, or axillary lymph nodes.  Exclusion of hilar lymphadenopathy is somewhat limited without  intravenous contrast. Negative for pleural or pericardial  effusion. The thoracic aorta is normal in caliber contains some  scattered atherosclerotic calcification.  Lungs/pleura: There are postoperative changes of wedge resection  in the left upper lobe. Negative for lung nodules, mass, or  airspace disease. Negative for pleural or pericardial effusion.  Upper abdomen: Adrenal glands are stable  and unremarkable.  Partially this lies right renal cyst appears similar to prior chest  CT, but incompletely imaged.  Musculoskeletal: Multilevel degenerative changes of the thoracic  spine. No acute or suspicious osseous abnormality.  IMPRESSION:  1. No evidence of recurrent or metastatic disease in the thorax.  Prior left upper lobe wedge resection.  2. Atherosclerosis.  3. Partially visualized right upper pole renal cyst.  Original Report Authenticated By: Britta Mccreedy, M.D.  Impression:  72 year old woman now 4 years from a lingular segmentectomy for a stage IA non-small cell carcinoma. She has no evidence of recurrent disease. We'll plan to see her back in 6 months with a repeat CT of the chest.   Plan: Return in 6 months with CT of chest

## 2012-10-03 ENCOUNTER — Other Ambulatory Visit: Payer: Self-pay | Admitting: Family Medicine

## 2012-10-04 ENCOUNTER — Other Ambulatory Visit: Payer: Self-pay | Admitting: Family Medicine

## 2012-10-04 MED ORDER — FUROSEMIDE 40 MG PO TABS: 40.0000 mg | ORAL_TABLET | Freq: Every day | ORAL | Status: DC

## 2012-10-04 MED ORDER — LISINOPRIL 20 MG PO TABS: 20.0000 mg | ORAL_TABLET | Freq: Every day | ORAL | Status: DC

## 2012-10-04 MED ORDER — POTASSIUM CHLORIDE CRYS ER 10 MEQ PO TBCR: EXTENDED_RELEASE_TABLET | ORAL | Status: DC

## 2012-10-04 NOTE — Telephone Encounter (Signed)
Pt called and would like refills on her medications. [facility] told her that she needed to call us since there were no refills left. Potassium-chloride, lisinopril, furosemide. JW

## 2012-10-04 NOTE — Telephone Encounter (Signed)
Med refill routed to MD Wyatt Haste, RN-BSN

## 2012-10-09 ENCOUNTER — Encounter: Payer: Self-pay | Admitting: Family Medicine

## 2012-10-09 ENCOUNTER — Ambulatory Visit (INDEPENDENT_AMBULATORY_CARE_PROVIDER_SITE_OTHER): Payer: Medicare HMO | Admitting: Family Medicine

## 2012-10-09 VITALS — BP 124/97 | HR 75 | Temp 99.2°F | Ht 66.0 in | Wt 191.0 lb

## 2012-10-09 DIAGNOSIS — Z131 Encounter for screening for diabetes mellitus: Secondary | ICD-10-CM

## 2012-10-09 DIAGNOSIS — R5383 Other fatigue: Secondary | ICD-10-CM

## 2012-10-09 DIAGNOSIS — I10 Essential (primary) hypertension: Secondary | ICD-10-CM

## 2012-10-09 DIAGNOSIS — R5381 Other malaise: Secondary | ICD-10-CM

## 2012-10-09 DIAGNOSIS — M26609 Unspecified temporomandibular joint disorder, unspecified side: Secondary | ICD-10-CM

## 2012-10-09 DIAGNOSIS — Z79899 Other long term (current) drug therapy: Secondary | ICD-10-CM

## 2012-10-09 LAB — COMPLETE METABOLIC PANEL WITH GFR
ALT: 16 U/L (ref 0–35)
AST: 19 U/L (ref 0–37)
Albumin: 4.1 g/dL (ref 3.5–5.2)
Alkaline Phosphatase: 109 U/L (ref 39–117)
BUN: 11 mg/dL (ref 6–23)
CO2: 28 mEq/L (ref 19–32)
Calcium: 9.3 mg/dL (ref 8.4–10.5)
Chloride: 104 mEq/L (ref 96–112)
Creat: 0.75 mg/dL (ref 0.50–1.10)
GFR, Est African American: >89 mL/min
GFR, Est Non African American: 79 mL/min
Glucose, Bld: 81 mg/dL (ref 70–99)
Potassium: 4 mEq/L (ref 3.5–5.3)
Sodium: 138 mEq/L (ref 135–145)
Total Bilirubin: 0.4 mg/dL (ref 0.3–1.2)
Total Protein: 6.9 g/dL (ref 6.0–8.3)

## 2012-10-09 LAB — CBC
HCT: 36.4 % (ref 36.0–46.0)
Hemoglobin: 12 g/dL (ref 12.0–15.0)
MCH: 27.3 pg (ref 26.0–34.0)
MCHC: 33 g/dL (ref 30.0–36.0)
MCV: 82.9 fL (ref 78.0–100.0)
Platelets: 298 10*3/uL (ref 150–400)
RBC: 4.39 MIL/uL (ref 3.87–5.11)
RDW: 15.8 % — ABNORMAL HIGH (ref 11.5–15.5)
WBC: 6.7 10*3/uL (ref 4.0–10.5)

## 2012-10-09 LAB — POCT GLYCOSYLATED HEMOGLOBIN (HGB A1C): Hemoglobin A1C: 6.1

## 2012-10-09 MED ORDER — CYCLOBENZAPRINE HCL 10 MG PO TABS: 10.0000 mg | ORAL_TABLET | Freq: Three times a day (TID) | ORAL | Status: DC | PRN

## 2012-10-09 NOTE — Patient Instructions (Addendum)
It was nice seeing you today.  Below are the things we have talked about today.    1) Sleep - Please practice good sleep hygeine (see below) Insomnia Insomnia is frequent trouble falling and/or staying asleep. Insomnia can be a long term problem or a short term problem. Both are common. Insomnia can be a short term problem when the wakefulness is related to a certain stress or worry. Long term insomnia is often related to ongoing stress during waking hours and/or poor sleeping habits. Overtime, sleep deprivation itself can make the problem worse. Every little thing feels more severe because you are overtired and your ability to cope is decreased. CAUSES   Stress, anxiety, and depression.  Poor sleeping habits.  Distractions such as TV in the bedroom.  Naps close to bedtime.  Engaging in emotionally charged conversations before bed.  Technical reading before sleep.  Alcohol and other sedatives. They may make the problem worse. They can hurt normal sleep patterns and normal dream activity.  Stimulants such as caffeine for several hours prior to bedtime.  Pain syndromes and shortness of breath can cause insomnia.  Exercise late at night.  Changing time zones may cause sleeping problems (jet lag). It is sometimes helpful to have someone observe your sleeping patterns. They should look for periods of not breathing during the night (sleep apnea). They should also look to see how long those periods last. If you live alone or observers are uncertain, you can also be observed at a sleep clinic where your sleep patterns will be professionally monitored. Sleep apnea requires a checkup and treatment. Give your caregivers your medical history. Give your caregivers observations your family has made about your sleep.  SYMPTOMS   Not feeling rested in the morning.  Anxiety and restlessness at bedtime.  Difficulty falling and staying asleep. TREATMENT   Your caregiver may prescribe treatment  for an underlying medical disorders. Your caregiver can give advice or help if you are using alcohol or other drugs for self-medication. Treatment of underlying problems will usually eliminate insomnia problems.  Medications can be prescribed for short time use. They are generally not recommended for lengthy use.  Over-the-counter sleep medicines are not recommended for lengthy use. They can be habit forming.  You can promote easier sleeping by making lifestyle changes such as:  Using relaxation techniques that help with breathing and reduce muscle tension.  Exercising earlier in the day.  Changing your diet and the time of your last meal. No night time snacks.  Establish a regular time to go to bed.  Counseling can help with stressful problems and worry.  Soothing music and white noise may be helpful if there are background noises you cannot remove.  Stop tedious detailed work at least one hour before bedtime. HOME CARE INSTRUCTIONS   Keep a diary. Inform your caregiver about your progress. This includes any medication side effects. See your caregiver regularly. Take note of:  Times when you are asleep.  Times when you are awake during the night.  The quality of your sleep.  How you feel the next day. This information will help your caregiver care for you.  Get out of bed if you are still awake after 15 minutes. Read or do some quiet activity. Keep the lights down. Wait until you feel sleepy and go back to bed.  Keep regular sleeping and waking hours. Avoid naps.  Exercise regularly.  Avoid distractions at bedtime. Distractions include watching television or engaging in any intense or  detailed activity like attempting to balance the household checkbook.  Develop a bedtime ritual. Keep a familiar routine of bathing, brushing your teeth, climbing into bed at the same time each night, listening to soothing music. Routines increase the success of falling to sleep faster.  Use  relaxation techniques. This can be using breathing and muscle tension release routines. It can also include visualizing peaceful scenes. You can also help control troubling or intruding thoughts by keeping your mind occupied with boring or repetitive thoughts like the old concept of counting sheep. You can make it more creative like imagining planting one beautiful flower after another in your backyard garden.  During your day, work to eliminate stress. When this is not possible use some of the previous suggestions to help reduce the anxiety that accompanies stressful situations. MAKE SURE YOU:   Understand these instructions.  Will watch your condition.  Will get help right away if you are not doing well or get worse. Document Released: 01/30/2000 Document Revised: 04/26/2011 Document Reviewed: 03/01/2007 [facility] Patient Information 2014 [place], [place].  - I do not feel that medications are beneficial at this time.   2) Jaw pain - I have prescribed Flexeril for your discomfort; use it as needed. - You can also use Ibuprofen or Aleve as needed. - Try and avoid the triggers  Temporomandibular Problems  Temporomandibular joint (TMJ) dysfunction means there are problems with the joint between your jaw and your skull. This is a joint lined by cartilage like other joints in your body but also has a small disc in the joint which keeps the bones from rubbing on each other. These joints are like other joints and can get inflamed (sore) from arthritis and other problems. When this joint gets sore, it can cause headaches and pain in the jaw and the face. CAUSES  Usually the arthritic types of problems are caused by soreness in the joint. Soreness in the joint can also be caused by overuse. This may come from grinding your teeth. It may also come from mis-alignment in the joint. DIAGNOSIS Diagnosis of this condition can often be made by history and exam. Sometimes your caregiver may need X-rays or an  MRI scan to determine the exact cause. It may be necessary to see your dentist to determine if your teeth and jaws are lined up correctly. TREATMENT  Most of the time this problem is not serious; however, sometimes it can persist (become chronic). When this happens medications that will cut down on inflammation (soreness) help. Sometimes a shot of cortisone into the joint will be helpful. If your teeth are not aligned it may help for your dentist to make a splint for your mouth that can help this problem. If no physical problems can be found, the problem may come from tension. If tension is found to be the cause, biofeedback or relaxation techniques may be helpful. HOME CARE INSTRUCTIONS   Later in the day, applications of ice packs may be helpful. Ice can be used in a plastic bag with a towel around it to prevent frostbite to skin. This may be used about every 2 hours for 20 to 30 minutes, as needed while awake, or as directed by your caregiver.  Only take over-the-counter or prescription medicines for pain, discomfort, or fever as directed by your caregiver.  If physical therapy was prescribed, follow your caregiver's directions.  Wear mouth appliances as directed if they were given. Document Released: 10/27/2000 Document Revised: 04/26/2011 Document Reviewed: 02/04/2008 [employer] Patient  Information 2014 [place], [place].  3) Diabetes  - Your A1C was 6.1.  This put you at risk for diabetes.  However you DO NOT have diabetes at this time.

## 2012-10-09 NOTE — Progress Notes (Signed)
Subjective:     Patient ID: Susan Ingram, female   DOB: 1940/12/14, 72 y.o.   MRN: [id number]  HPI 72 year old female presents to the clinic today with multiple complaints/concerns.  1) Insomnia - Patient reports that she has been having difficulty sleeping for months - When asked what she thinks keeps her from sleeping well, she reports that she is worried and has racing thoughts at night. - She has trouble initiating and remaining asleep. - In regards to sleep hygeine, patient admits that she watches TV in bed.  2) Jaw pain - Left. - Patient reports 2 month history of jaw pain - Pain located on left side and occurs with motion (particularly stretching with yawning). - Pain 8/10 in severity and sharp.  Resolves spontaneuosly in approximately 10 mins. - No reported popping or clicking   3) Concern for diabetes & Fatigue - Patient reports a family history of diabetes and she is concerned that she may have it.  She desires screening today. - Additionally, she reports that she has been experiencing increase fatigue for several months.  She reports that she "just don't feel good."  Patient has continued to work and do her normal activities despite fatigue.  Review of Systems Per HPI    Objective:   Physical Exam Filed Vitals:   10/09/12 1450  BP: 124/97  Pulse: 75  Temp: 99.2 F (37.3 C)  Exam: General: well appearing, NAD. HEENT: Jaw - tenderness noted at left TMJ with opening/closing of mouth. Cardiovascular: RRR. No murmurs, rubs, or gallops. Respiratory: CTAB. No rales, rhonchi, or wheeze. Abdomen: soft, nontender, nondistended. Extremities:  No LE edema. Skin: Warm, dry, intact.     Assessment:     See Problem list     Plan:

## 2012-10-10 DIAGNOSIS — Z131 Encounter for screening for diabetes mellitus: Secondary | ICD-10-CM | POA: Insufficient documentation

## 2012-10-10 DIAGNOSIS — M26621 Arthralgia of right temporomandibular joint: Secondary | ICD-10-CM | POA: Insufficient documentation

## 2012-10-10 DIAGNOSIS — R5383 Other fatigue: Secondary | ICD-10-CM | POA: Insufficient documentation

## 2012-10-10 HISTORY — DX: Arthralgia of right temporomandibular joint: M26.621

## 2012-10-10 LAB — VITAMIN B12: Vitamin B-12: 977 pg/mL — ABNORMAL HIGH (ref 211–911)

## 2012-10-10 LAB — TSH: TSH: 0.677 u[IU]/mL (ref 0.350–4.500)

## 2012-10-10 NOTE — Assessment & Plan Note (Signed)
Unclear etiology at this time.  CBC, CMP, B12, TSH obtained today.  Results were unremarkable. Will have patient follow up in 1-3 months.

## 2012-10-10 NOTE — Assessment & Plan Note (Addendum)
Patient clinically has TMJ.   Advised avoiding triggers. Patient also advised to use NSAIDS as needed.  Also gave patient Rx for Flexeril. Handout regarding TMJ given.

## 2012-10-10 NOTE — Assessment & Plan Note (Signed)
A1C obtained today and was 6.1.  Patient informed that she is in the "prediabetic range" but that she does not currently meet criteria for diagnosis of diabetes. Advised healthy well balanced diet and regular exercise.

## 2012-10-27 ENCOUNTER — Other Ambulatory Visit: Payer: Self-pay | Admitting: *Deleted

## 2012-10-27 NOTE — Telephone Encounter (Signed)
Patient is requesting 90-day refill of lisinopril, furosemide, and potassium.  Gaylene Brooks, RN

## 2012-10-28 MED ORDER — POTASSIUM CHLORIDE CRYS ER 10 MEQ PO TBCR: EXTENDED_RELEASE_TABLET | ORAL | Status: DC

## 2012-10-28 MED ORDER — LISINOPRIL 20 MG PO TABS: 20.0000 mg | ORAL_TABLET | Freq: Every day | ORAL | Status: DC

## 2012-10-28 MED ORDER — FUROSEMIDE 40 MG PO TABS: 40.0000 mg | ORAL_TABLET | Freq: Every day | ORAL | Status: DC

## 2012-11-02 ENCOUNTER — Telehealth: Payer: Self-pay | Admitting: Family Medicine

## 2012-11-02 NOTE — Telephone Encounter (Signed)
Pharmacy is called.RX are all ready for pt. No answer at pt home, no ans machine.

## 2012-11-02 NOTE — Telephone Encounter (Signed)
Pt called because [facility] is saying that lisinopril, potassium, and lasix is not refillable. It shows that Dr. Adriana Simas sent all three of these in on 9/12. Can we call or re-send this so that she can pick up her medication. JW

## 2012-12-22 ENCOUNTER — Ambulatory Visit: Payer: Medicare HMO

## 2012-12-27 ENCOUNTER — Encounter: Payer: Self-pay | Admitting: Family Medicine

## 2012-12-27 ENCOUNTER — Ambulatory Visit (INDEPENDENT_AMBULATORY_CARE_PROVIDER_SITE_OTHER): Payer: Medicare HMO | Admitting: Family Medicine

## 2012-12-27 VITALS — BP 148/82 | HR 71 | Temp 98.2°F | Ht 66.0 in | Wt 191.3 lb

## 2012-12-27 DIAGNOSIS — M25561 Pain in right knee: Secondary | ICD-10-CM

## 2012-12-27 DIAGNOSIS — Z23 Encounter for immunization: Secondary | ICD-10-CM

## 2012-12-27 DIAGNOSIS — I10 Essential (primary) hypertension: Secondary | ICD-10-CM

## 2012-12-27 DIAGNOSIS — G4762 Sleep related leg cramps: Secondary | ICD-10-CM

## 2012-12-27 DIAGNOSIS — M25569 Pain in unspecified knee: Secondary | ICD-10-CM

## 2012-12-27 LAB — BASIC METABOLIC PANEL
BUN: 13 mg/dL (ref 6–23)
CO2: 27 mEq/L (ref 19–32)
Calcium: 9.5 mg/dL (ref 8.4–10.5)
Chloride: 102 mEq/L (ref 96–112)
Creat: 0.74 mg/dL (ref 0.50–1.10)
Glucose, Bld: 86 mg/dL (ref 70–99)
Potassium: 4.1 mEq/L (ref 3.5–5.3)
Sodium: 136 mEq/L (ref 135–145)

## 2012-12-27 MED ORDER — METHYLPREDNISOLONE ACETATE 40 MG/ML INJ SUSP (RADIOLOG: 40.0000 mg | Freq: Once | INTRAMUSCULAR | Status: DC

## 2012-12-27 NOTE — Patient Instructions (Signed)
It was nice seeing you.  Your pain should improve with the injection.  Follow up in 1-3 months.  Continue to take the mustard prior to bed.  Be sure to stretch those calves.

## 2012-12-28 ENCOUNTER — Encounter: Payer: Self-pay | Admitting: Family Medicine

## 2012-12-28 DIAGNOSIS — G4762 Sleep related leg cramps: Secondary | ICD-10-CM | POA: Insufficient documentation

## 2012-12-28 NOTE — Assessment & Plan Note (Signed)
BMP obtained and was WNL. Advised use of mustard before bed as well as stretching.

## 2012-12-28 NOTE — Progress Notes (Signed)
Procedure: Steroid injection of Right knee Verbal consent obtained. Medication:  1 cc Solumedrol, 4 cc 1% Lidocaine Preparation: area cleansed with betadine  Injection: Landmarks identified 5 cc of medication injected into joint space using a antero-medial approach.  Dr. Jennette Kettle present for injection. Patient tolerated well without bleeding or paresthesias  Patient had good range of motion of joint after injection.  Everlene Other DO Family Medicine PGY-2

## 2012-12-28 NOTE — Progress Notes (Signed)
Subjective:     Patient ID: Susan Ingram, female   DOB: 05-04-1940, 72 y.o.   MRN: [id number]  HPI 72 year old female presents for evaluation of knee pain and cramping.  1) Knee Pain - Right - Located anterior and medial knee pain - Has been present for ~ 1-2 years - Pain worsens at the end of the day.  She often finds it difficult to rise from sitting to standing. - Patient has had x-rays which showed osteoarthritis. - She currently takes Tylenol arthritis for the pain which does help.  She is also using a compression brace. - Recently, she says the pain has been getting worse and has been unrelieved by Tylenol. - Pain sometimes radiates from her knee to the upper lateral thigh. - No associated muscle weakness, back pain, loss of bladder or bladder or bowel.  No recent fevers, chills. No reported redness of her knee.  Patient does note occasional swelling of her knee.  2) Muscle cramping - Patient reports she often has trouble with nocturnal leg cramps. - They're quite severe and extremely painful.   Review of Systems Per HPI    Objective:   Physical Exam Exam: General: well appearing female in NAD. MSK: Right knee - crepitus noted with extension of knee.  No joint line tenderness or effusion noted.  No appreciable baker's cyst or popliteal fossa fullness.  Good ROM.     Assessment:     A total of 30 minutes were spent face-to-face with the patient during this encounter and over half of that time was spent on counseling and coordination of care.  See Problem List     Plan:

## 2012-12-28 NOTE — Assessment & Plan Note (Signed)
Steroid injection done today. Advised continued Tylenol.

## 2013-01-05 ENCOUNTER — Other Ambulatory Visit: Payer: Self-pay | Admitting: *Deleted

## 2013-01-05 DIAGNOSIS — C3490 Malignant neoplasm of unspecified part of unspecified bronchus or lung: Secondary | ICD-10-CM

## 2013-01-26 ENCOUNTER — Other Ambulatory Visit: Payer: Self-pay | Admitting: *Deleted

## 2013-01-30 ENCOUNTER — Other Ambulatory Visit: Payer: Medicare HMO

## 2013-01-30 ENCOUNTER — Ambulatory Visit: Payer: Commercial Managed Care - HMO | Admitting: Thoracic Surgery (Cardiothoracic Vascular Surgery)

## 2013-02-10 ENCOUNTER — Emergency Department (INDEPENDENT_AMBULATORY_CARE_PROVIDER_SITE_OTHER)
Admission: EM | Admit: 2013-02-10 | Discharge: 2013-02-10 | Disposition: A | Discharge status: Home / Self Care | Payer: Medicare HMO | Source: Home / Self Care

## 2013-02-10 ENCOUNTER — Encounter (HOSPITAL_COMMUNITY): Payer: Self-pay | Admitting: Emergency Medicine

## 2013-02-10 DIAGNOSIS — J329 Chronic sinusitis, unspecified: Secondary | ICD-10-CM

## 2013-02-10 DIAGNOSIS — R0982 Postnasal drip: Secondary | ICD-10-CM

## 2013-02-10 DIAGNOSIS — J069 Acute upper respiratory infection, unspecified: Secondary | ICD-10-CM

## 2013-02-10 MED ORDER — PHENYLEPHRINE-CHLORPHEN-DM 10-4-12.5 MG/5ML PO LIQD: 5.0000 mL | ORAL | Status: DC | PRN

## 2013-02-10 MED ORDER — PREDNISONE 20 MG PO TABS: ORAL_TABLET | ORAL | Status: DC

## 2013-02-10 NOTE — ED Provider Notes (Signed)
Medical screening examination/treatment/procedure(s) were performed by non-physician practitioner and as supervising physician I was immediately available for consultation/collaboration.  Royer Cristobal, M.D.   Kourtland Coopman C Shemica Meath, MD 02/10/13 2257 

## 2013-02-10 NOTE — ED Provider Notes (Signed)
CSN: [id number]     Arrival date & time 02/10/13  1243 History   First MD Initiated Contact with Patient 02/10/13 1608     Chief Complaint  Patient presents with  . URI   (Consider location/radiation/quality/duration/timing/severity/associated sxs/prior Treatment) HPI Comments: 72 year old female complaining of cough, runny nose, PND, headache right and left facial soreness. She is not taken medications for any of the symptoms. She does have a history of asthma and has developed intermittent exertional dyspnea in the past couple of days. She uses her albuterol HFA in that helps. Denies fever, sore throat or earache. Denies chest pain or resting shortness of breath.   Past Medical History  Diagnosis Date  . Cancer     lung  . Hypertension   . Hypercholesterolemia   . Eczema   . Asthma    Past Surgical History  Procedure Laterality Date  . Bronchoscopy, left video-assisted thoracoscopy, wedge  05/23/2008  . Left thyroid lobectomy, frozen section.  10/11/2002  . Vaginal hysterectomy     Family History  Problem Relation Age of Onset  . Alzheimer's disease Father   . Early death Mother   . Hypertension Mother   . Stroke Mother    History  Substance Use Topics  . Smoking status: Former Smoker    Quit date: 04/15/1993  . Smokeless tobacco: Not on file  . Alcohol Use: No   OB History   Grav Para Term Preterm Abortions TAB SAB Ect Mult Living                 Review of Systems  Constitutional: Positive for activity change. Negative for fever, chills, appetite change and fatigue.  HENT: Positive for congestion, postnasal drip and rhinorrhea. Negative for ear pain, facial swelling and sore throat.   Eyes: Negative.   Respiratory: Positive for cough. Negative for chest tightness and shortness of breath.   Cardiovascular: Negative.   Genitourinary: Negative.   Musculoskeletal: Negative for neck pain and neck stiffness.  Skin: Negative for pallor and rash.  Neurological:  Negative.     Allergies  Amlodipine; Maxzide; and Toprol xl  Home Medications   Current Outpatient Rx  Name  Route  Sig  Dispense  Refill  . acetaminophen (TYLENOL) 500 MG tablet   Oral   Take 500 mg by mouth every 6 (six) hours as needed.         Marland Kitchen albuterol (PROVENTIL HFA;VENTOLIN HFA) 108 (90 BASE) MCG/ACT inhaler   Inhalation   Inhale 2 puffs into the lungs every 6 (six) hours as needed.         Marland Kitchen albuterol (PROVENTIL HFA;VENTOLIN HFA) 108 (90 BASE) MCG/ACT inhaler   Inhalation   Inhale 2 puffs into the lungs every 4 (four) hours as needed for wheezing.   1 Inhaler   12   . amoxicillin (AMOXIL) 500 MG capsule   Oral   Take 2 capsules (1,000 mg total) by mouth 3 (three) times daily.   60 capsule   0     Dispense as written.   . calcium-vitamin D 250-100 MG-UNIT per tablet   Oral   Take 1 tablet by mouth 2 (two) times daily.           . cyclobenzaprine (FLEXERIL) 10 MG tablet   Oral   Take 1 tablet (10 mg total) by mouth 3 (three) times daily as needed for muscle spasms.   30 tablet   0   . diclofenac sodium (VOLTAREN) 1 % GEL  Topical   Apply 4 g topically 4 (four) times daily as needed. For 1 month.   100 g   0   . furosemide (LASIX) 40 MG tablet   Oral   Take 1 tablet (40 mg total) by mouth daily.   30 tablet   6   . guaiFENesin (MUCINEX) 600 MG 12 hr tablet   Oral   Take 1 tablet (600 mg total) by mouth 2 (two) times daily.   30 tablet   0   . guaiFENesin-codeine (GUIATUSS AC) 100-10 MG/5ML syrup   Oral   Take 10 mLs by mouth 4 (four) times daily as needed for cough.   240 mL   0   . lisinopril (PRINIVIL,ZESTRIL) 20 MG tablet   Oral   Take 1 tablet (20 mg total) by mouth daily.   30 tablet   6   . Phenylephrine-Chlorphen-DM 11-19-10.5 MG/5ML LIQD   Oral   Take 5 mLs by mouth every 4 (four) hours as needed.   120 mL   0   . potassium chloride (KLOR-CON M10) 10 MEQ tablet      TAKE ONE TABLET BY MOUTH EVERY DAY   30 tablet    6   . predniSONE (DELTASONE) 20 MG tablet      2 tabs po once daily x 3 days, then 1 tab daily for 3 days. Take with food.   9 tablet   0    BP 150/80  Pulse 71  Temp(Src) 99.4 F (37.4 C) (Oral)  Resp 16  SpO2 99% Physical Exam  Nursing note and vitals reviewed. Constitutional: She is oriented to person, place, and time. She appears well-developed and well-nourished. No distress.  HENT:  Bilateral TMs are normal Oropharynx with clear PND minor erythema and cobblestoning. No exudates. There is minor subcutaneous swelling in the paranasal tissues. The overlying dermis in these areas are tender. Unable to press on the maxillary sinuses to 2 skin pain with minor tactile palpation.  Eyes: EOM are normal.  Neck: Normal range of motion. Neck supple.  Cardiovascular: Normal rate, regular rhythm and normal heart sounds.   Pulmonary/Chest: Effort normal and breath sounds normal. No respiratory distress. She has no wheezes. She has no rales.  Abdominal: Soft. There is no tenderness.  Musculoskeletal: Normal range of motion. She exhibits no edema.  Lymphadenopathy:    She has no cervical adenopathy.  Neurological: She is alert and oriented to person, place, and time.  Skin: Skin is warm and dry. No rash noted.  Psychiatric: She has a normal mood and affect.    ED Course  Procedures (including critical care time) Labs Review Labs Reviewed - No data to display Imaging Review No results found.    MDM   1. Rhinosinusitis   2. URI (upper respiratory infection)   3. PND (post-nasal drip)      Continue to use her albuterol HFA Lab prednisone as directed Drink plenty of fluids and stay well-hydrated Norco CS as directed For development of fevers, shortness of breath or worsening may return or followup with your PCP.  Hayden Rasmussen, NP 02/10/13 [phone]

## 2013-02-10 NOTE — ED Notes (Signed)
Pt   C/o   Sinus  Infection  With  headche     And  Sinus  Pressure         X  1  Week     She  Is  Sitting  Upright on  Exam table  Speaking in  Complete  sentances    In no  Acute  Distress

## 2013-02-13 NOTE — ED Notes (Signed)
Spoke w pharmacy [facility], who have apparent issues w cough syrup Rx and have bromphed/sudaphed/dextromethphorpan formulation , asking to substitute, approval by Clementeen Graham

## 2013-02-27 ENCOUNTER — Encounter: Payer: Self-pay | Admitting: *Deleted

## 2013-02-27 ENCOUNTER — Encounter: Payer: Self-pay | Admitting: Thoracic Surgery (Cardiothoracic Vascular Surgery)

## 2013-02-27 ENCOUNTER — Ambulatory Visit
Admission: RE | Admit: 2013-02-27 | Discharge: 2013-02-27 | Disposition: A | Discharge status: Home / Self Care | Payer: Commercial Managed Care - HMO | Source: Ambulatory Visit | Attending: Thoracic Surgery (Cardiothoracic Vascular Surgery) | Admitting: Thoracic Surgery (Cardiothoracic Vascular Surgery)

## 2013-02-27 ENCOUNTER — Ambulatory Visit (INDEPENDENT_AMBULATORY_CARE_PROVIDER_SITE_OTHER): Payer: Commercial Managed Care - HMO | Admitting: Thoracic Surgery (Cardiothoracic Vascular Surgery)

## 2013-02-27 VITALS — BP 130/80 | HR 72 | Resp 16 | Ht 66.0 in | Wt 200.0 lb

## 2013-02-27 DIAGNOSIS — Z85118 Personal history of other malignant neoplasm of bronchus and lung: Secondary | ICD-10-CM

## 2013-02-27 DIAGNOSIS — Z09 Encounter for follow-up examination after completed treatment for conditions other than malignant neoplasm: Secondary | ICD-10-CM

## 2013-02-27 DIAGNOSIS — C3490 Malignant neoplasm of unspecified part of unspecified bronchus or lung: Secondary | ICD-10-CM

## 2013-02-27 NOTE — Progress Notes (Signed)
HPI:  [name] returns for a scheduled followup visit. She had a lingular segmentectomy for stage IA non-small cell carcinoma in April 2010. She was last seen in the office in June of 2014 at which time she was doing well with no evidence recurrent disease.   She says that she's continued to do well. She did have some bronchitis and was seen in urgent care recently. Other than she's not had any problems with her breathing. She has not lost weight. She's not had any unusual headaches or visual changes. She does have a cough from time to time but nothing out of the ordinary. She has not had any hemoptysis. She continues to work full time.  Past Medical History  Diagnosis Date  . Cancer     lung  . Hypertension   . Hypercholesterolemia   . Eczema   . Asthma     Past Surgical History  Procedure Laterality Date  . Bronchoscopy, left video-assisted thoracoscopy, wedge  05/23/2008  . Left thyroid lobectomy, frozen section.  10/11/2002  . Vaginal hysterectomy        Current Outpatient Prescriptions  Medication Sig Dispense Refill  . acetaminophen (TYLENOL) 500 MG tablet Take 500 mg by mouth every 6 (six) hours as needed.      Marland Kitchen albuterol (PROVENTIL HFA;VENTOLIN HFA) 108 (90 BASE) MCG/ACT inhaler Inhale 2 puffs into the lungs every 6 (six) hours as needed.      Marland Kitchen albuterol (PROVENTIL HFA;VENTOLIN HFA) 108 (90 BASE) MCG/ACT inhaler Inhale 2 puffs into the lungs every 4 (four) hours as needed for wheezing.  1 Inhaler  12  . calcium-vitamin D 250-100 MG-UNIT per tablet Take 1 tablet by mouth 2 (two) times daily.        . cyclobenzaprine (FLEXERIL) 10 MG tablet Take 1 tablet (10 mg total) by mouth 3 (three) times daily as needed for muscle spasms.  30 tablet  0  . diclofenac sodium (VOLTAREN) 1 % GEL Apply 4 g topically 4 (four) times daily as needed. For 1 month.  100 g  0  . furosemide (LASIX) 40 MG tablet Take 1 tablet (40 mg total) by mouth daily.  30 tablet  6  . lisinopril  (PRINIVIL,ZESTRIL) 20 MG tablet Take 1 tablet (20 mg total) by mouth daily.  30 tablet  6  . potassium chloride (KLOR-CON M10) 10 MEQ tablet TAKE ONE TABLET BY MOUTH EVERY DAY  30 tablet  6  . [DISCONTINUED] potassium chloride (K-DUR) 10 MEQ tablet Take 1 tablet (10 mEq total) by mouth daily.  30 tablet  6   No current facility-administered medications for this visit.    Physical Exam BP 130/80  Pulse 72  Resp 16  Ht 5\' 6"  (1.676 m)  Wt 200 lb (90.719 kg)  BMI 32.30 kg/m2  SpO56 82% 73 year old woman in no acute distress Well-developed well-nourished Neurologic alert and oriented x3 with no focal deficits No cervical or supraclavicular or epitrochlear adenopathy Lungs clear with equal breath sounds bilaterally Cardiac regular rate and rhythm normal S1 and S2  Diagnostic Tests: CT chest 02/27/2013 CT CHEST WITHOUT CONTRAST  TECHNIQUE:  Multidetector CT imaging of the chest was performed following the  standard protocol without IV contrast.  COMPARISON: 07/25/2012  FINDINGS:  Prior left thyroidectomy. Stable postoperative findings in the left  lung. No pathologic thoracic adenopathy  Right kidney upper pole fluid density, likely a cyst, 4.3 cm in  diameter. Adrenal glands unremarkable.  No recurrent malignancy identified. No new nodule.  Thoracic spondylosis noted. No thoracic malalignment.  IMPRESSION:  1. No findings of recurrent malignancy. Prior wedge resection, left  upper lobe.  2. Right kidney upper pole cyst, fluid density on today's  noncontrast exam.  Electronically Signed  By: Sherryl Barters M.D.  On: 02/27/2013 15:39   Impression: 73 year old woman who now is for a half years post segmentectomy for stage IA non-small cell carcinoma. She has no evidence recurrent disease. She continues to do well. She's working full time and remains active.  Plan: Return in 6 months with CT for final followup visit.

## 2013-03-01 ENCOUNTER — Emergency Department (HOSPITAL_COMMUNITY): Payer: Medicare HMO

## 2013-03-01 ENCOUNTER — Encounter (HOSPITAL_COMMUNITY): Payer: Self-pay | Admitting: Emergency Medicine

## 2013-03-01 ENCOUNTER — Emergency Department (HOSPITAL_COMMUNITY)
Admission: EM | Admit: 2013-03-01 | Discharge: 2013-03-01 | Disposition: A | Discharge status: Home / Self Care | Payer: Medicare HMO | Attending: Emergency Medicine | Admitting: Emergency Medicine

## 2013-03-01 DIAGNOSIS — R200 Anesthesia of skin: Secondary | ICD-10-CM

## 2013-03-01 DIAGNOSIS — J45909 Unspecified asthma, uncomplicated: Secondary | ICD-10-CM | POA: Insufficient documentation

## 2013-03-01 DIAGNOSIS — Z87891 Personal history of nicotine dependence: Secondary | ICD-10-CM | POA: Insufficient documentation

## 2013-03-01 DIAGNOSIS — R0602 Shortness of breath: Secondary | ICD-10-CM | POA: Insufficient documentation

## 2013-03-01 DIAGNOSIS — Z888 Allergy status to other drugs, medicaments and biological substances status: Secondary | ICD-10-CM | POA: Insufficient documentation

## 2013-03-01 DIAGNOSIS — R079 Chest pain, unspecified: Secondary | ICD-10-CM | POA: Insufficient documentation

## 2013-03-01 DIAGNOSIS — R209 Unspecified disturbances of skin sensation: Secondary | ICD-10-CM | POA: Insufficient documentation

## 2013-03-01 DIAGNOSIS — I10 Essential (primary) hypertension: Secondary | ICD-10-CM | POA: Insufficient documentation

## 2013-03-01 DIAGNOSIS — Z859 Personal history of malignant neoplasm, unspecified: Secondary | ICD-10-CM | POA: Insufficient documentation

## 2013-03-01 DIAGNOSIS — J069 Acute upper respiratory infection, unspecified: Secondary | ICD-10-CM | POA: Insufficient documentation

## 2013-03-01 DIAGNOSIS — L259 Unspecified contact dermatitis, unspecified cause: Secondary | ICD-10-CM | POA: Insufficient documentation

## 2013-03-01 DIAGNOSIS — E7800 Pure hypercholesterolemia, unspecified: Secondary | ICD-10-CM | POA: Insufficient documentation

## 2013-03-01 DIAGNOSIS — Z79899 Other long term (current) drug therapy: Secondary | ICD-10-CM | POA: Insufficient documentation

## 2013-03-01 LAB — CBC WITH DIFFERENTIAL/PLATELET
Basophils Absolute: 0 10*3/uL (ref 0.0–0.1)
Basophils Relative: 0 % (ref 0–1)
Eosinophils Absolute: 0.3 10*3/uL (ref 0.0–0.7)
Eosinophils Relative: 4 % (ref 0–5)
HCT: 39.6 % (ref 36.0–46.0)
Hemoglobin: 13.2 g/dL (ref 12.0–15.0)
Lymphocytes Relative: 32 % (ref 12–46)
Lymphs Abs: 2.3 10*3/uL (ref 0.7–4.0)
MCH: 28.1 pg (ref 26.0–34.0)
MCHC: 33.3 g/dL (ref 30.0–36.0)
MCV: 84.3 fL (ref 78.0–100.0)
Monocytes Absolute: 1 10*3/uL (ref 0.1–1.0)
Monocytes Relative: 14 % — ABNORMAL HIGH (ref 3–12)
Neutro Abs: 3.5 10*3/uL (ref 1.7–7.7)
Neutrophils Relative %: 50 % (ref 43–77)
Platelets: 298 10*3/uL (ref 150–400)
RBC: 4.7 MIL/uL (ref 3.87–5.11)
RDW: 15 % (ref 11.5–15.5)
WBC: 7.1 10*3/uL (ref 4.0–10.5)

## 2013-03-01 LAB — POCT I-STAT, CHEM 8
BUN: 11 mg/dL (ref 6–23)
Calcium, Ion: 1.24 mmol/L (ref 1.13–1.30)
Chloride: 102 mEq/L (ref 96–112)
Creatinine, Ser: 0.8 mg/dL (ref 0.50–1.10)
Glucose, Bld: 98 mg/dL (ref 70–99)
HCT: 42 % (ref 36.0–46.0)
Hemoglobin: 14.3 g/dL (ref 12.0–15.0)
Potassium: 4.3 mEq/L (ref 3.7–5.3)
Sodium: 140 mEq/L (ref 137–147)
TCO2: 30 mmol/L (ref 0–100)

## 2013-03-01 LAB — POCT I-STAT TROPONIN I: Troponin i, poc: 0.01 ng/mL (ref 0.00–0.08)

## 2013-03-01 MED ORDER — AZITHROMYCIN 250 MG PO TABS: 250.0000 mg | ORAL_TABLET | Freq: Every day | ORAL | Status: DC

## 2013-03-01 MED ORDER — IPRATROPIUM BROMIDE 0.02 % IN SOLN
0.5000 mg | Freq: Once | RESPIRATORY_TRACT | Status: AC
  Administered 2013-03-01: 0.5 mg via RESPIRATORY_TRACT
  Filled 2013-03-01: qty 2.5

## 2013-03-01 MED ORDER — ALBUTEROL SULFATE (2.5 MG/3ML) 0.083% IN NEBU
5.0000 mg | INHALATION_SOLUTION | Freq: Once | RESPIRATORY_TRACT | Status: AC
  Administered 2013-03-01: 5 mg via RESPIRATORY_TRACT
  Filled 2013-03-01: qty 6

## 2013-03-01 NOTE — ED Provider Notes (Signed)
CSN: [id number]     Arrival date & time 03/01/13  0744 History   First MD Initiated Contact with Patient 03/01/13 0756     Chief Complaint  Patient presents with  . Chest Pain  . Shortness of Breath   (Consider location/radiation/quality/duration/timing/severity/associated sxs/prior Treatment) Patient is a 73 y.o. female presenting with chest pain and shortness of breath. The history is provided by the patient.  Chest Pain Pain location:  Substernal area Associated symptoms: numbness and shortness of breath   Associated symptoms: no abdominal pain, no back pain, no headache, no nausea, not vomiting and no weakness   Shortness of Breath Associated symptoms: chest pain   Associated symptoms: no abdominal pain, no headaches, no rash and no vomiting    patient has had a cough with some shortness of breath for the last 2 weeks. She is seen her cardiothoracic surgeon doing during that time.she has had clear sputum for the whole time. She has no active cancer at this time. She has a dull chest pain. Is worse with coughing. She states that she also has had some mild abdominal pain from coughing. No fevers. She also states that she's had episodes where her left hand has felt numb. She states she had one yesterday it lasted around 10 minutes. She's had similar episodes in the past and is not seen anyone for it.. She states that there is no weakness now. No nausea vomiting. No diaphoresis.   Past Medical History  Diagnosis Date  . Cancer     lung  . Hypertension   . Hypercholesterolemia   . Eczema   . Asthma    Past Surgical History  Procedure Laterality Date  . Bronchoscopy, left video-assisted thoracoscopy, wedge  05/23/2008  . Left thyroid lobectomy, frozen section.  10/11/2002  . Vaginal hysterectomy     Family History  Problem Relation Age of Onset  . Alzheimer's disease Father   . Early death Mother   . Hypertension Mother   . Stroke Mother    History  Substance Use Topics  .  Smoking status: Former Smoker    Quit date: 04/15/1993  . Smokeless tobacco: Not on file  . Alcohol Use: No   OB History   Grav Para Term Preterm Abortions TAB SAB Ect Mult Living                 Review of Systems  Constitutional: Negative for activity change and appetite change.  Eyes: Negative for pain.  Respiratory: Positive for shortness of breath. Negative for chest tightness.   Cardiovascular: Positive for chest pain. Negative for leg swelling.  Gastrointestinal: Negative for nausea, vomiting, abdominal pain and diarrhea.  Genitourinary: Negative for flank pain.  Musculoskeletal: Negative for back pain and neck stiffness.  Skin: Negative for rash.  Neurological: Positive for numbness. Negative for weakness and headaches.  Psychiatric/Behavioral: Negative for behavioral problems.    Allergies  Amlodipine; Maxzide; and Toprol xl  Home Medications   Current Outpatient Rx  Name  Route  Sig  Dispense  Refill  . acetaminophen (TYLENOL) 500 MG tablet   Oral   Take 1,000 mg by mouth every 6 (six) hours as needed for mild pain.          Marland Kitchen albuterol (PROVENTIL HFA;VENTOLIN HFA) 108 (90 BASE) MCG/ACT inhaler   Inhalation   Inhale 2 puffs into the lungs every 6 (six) hours as needed for wheezing or shortness of breath.          Marland Kitchen  brompheniramine-pseudoephedrine-dextromethorphan (DIMETAPP DM) 15-1-5 MG/5ML ELIX   Oral   Take 5 mLs by mouth every 4 (four) hours as needed (for cough).         . calcium-vitamin D 250-100 MG-UNIT per tablet   Oral   Take 1 tablet by mouth 2 (two) times daily.           . furosemide (LASIX) 40 MG tablet   Oral   Take 1 tablet (40 mg total) by mouth daily.   30 tablet   6   . lisinopril (PRINIVIL,ZESTRIL) 20 MG tablet   Oral   Take 1 tablet (20 mg total) by mouth daily.   30 tablet   6   . potassium chloride (K-DUR,KLOR-CON) 10 MEQ tablet   Oral   Take 10 mEq by mouth daily.         Marland Kitchen azithromycin (ZITHROMAX) 250 MG tablet    Oral   Take 1 tablet (250 mg total) by mouth daily. Take first 2 tablets together, then 1 every day until finished.   6 tablet   0    BP 128/88  Pulse 78  Temp(Src) 98 F (36.7 C) (Oral)  Resp 18  SpO2 100% Physical Exam  Nursing note and vitals reviewed. Constitutional: She is oriented to person, place, and time. She appears well-developed and well-nourished.  HENT:  Head: Normocephalic and atraumatic.  Eyes: EOM are normal. Pupils are equal, round, and reactive to light.  Neck: Normal range of motion. Neck supple.  Cardiovascular: Normal rate, regular rhythm and normal heart sounds.   No murmur heard. Pulmonary/Chest: Effort normal. No respiratory distress. She has wheezes. She has no rales.  Mild diffuse wheezes.  Abdominal: Soft. Bowel sounds are normal. She exhibits no distension. There is no tenderness. There is no rebound and no guarding.  Musculoskeletal: Normal range of motion.  Neurological: She is alert and oriented to person, place, and time. No cranial nerve deficit.  Normal function and strength in bilateral upper extremities.  Skin: Skin is warm and dry.  Psychiatric: She has a normal mood and affect. Her speech is normal.    ED Course  Procedures (including critical care time) Labs Review Labs Reviewed  CBC WITH DIFFERENTIAL - Abnormal; Notable for the following:    Monocytes Relative 14 (*)    All other components within normal limits  POCT I-STAT, CHEM 8  POCT I-STAT TROPONIN I   Imaging Review Dg Chest 2 View  03/01/2013   CLINICAL DATA:  Mid chest pain, shortness of breath, pain at left scapula, cough, symptoms for 2 days, history lung cancer with prior left lung surgery, hypertension, asthma, former smoker  EXAM: CHEST  2 VIEW  COMPARISON:  11/24/2010  Correlation:  CT chest 02/27/2013  FINDINGS: Upper normal heart size.  Tortuous aorta.  Mediastinal contours and pulmonary vascularity normal.  Chronic bronchitic changes without infiltrate, pleural  effusion or pneumothorax.  Postsurgical changes left upper lobe.  Scattered endplate spur formation thoracic spine.  Additional surgical clips in the left cervical region question prior left thyroid lobectomy.  IMPRESSION: Chronic bronchitic changes.  Postsurgical changes left upper lobe.  No acute abnormalities.   Electronically Signed   By: Lavonia Dana M.D.   On: 03/01/2013 09:53   Mr Brain Wo Contrast  03/01/2013   CLINICAL DATA:  Lung cancer.  Left hand numbness.  EXAM: MRI HEAD WITHOUT CONTRAST  TECHNIQUE: Multiplanar, multiecho pulse sequences of the brain and surrounding structures were obtained without intravenous contrast.  COMPARISON:  None.  FINDINGS: Ventricle size is normal. Tiny white matter hyperintensities bilaterally most likely chronic microvascular ischemia. Negative for acute infarct.  Negative for hemorrhage or mass lesion.  No edema or midline shift.  Sinusitis with mucosal edema throughout the maxillary sinuses with air-fluid level on the right.  Craniocervical junction is normal.  Pituitary is normal in size.  IMPRESSION: Minimal chronic white matter changes.  No acute infarct or mass.  Sinusitis with air-fluid level.   Electronically Signed   By: Franchot Gallo M.D.   On: 03/01/2013 13:16    EKG Interpretation    Date/Time:  Thursday March 01 2013 07:45:51 EST Ventricular Rate:  63 PR Interval:  156 QRS Duration: 82 QT Interval:  374 QTC Calculation: 382 R Axis:   57 Text Interpretation:  Normal sinus rhythm Normal ECG Confirmed by Donterrius Santucci  MD, Jahmal Dunavant ([id number]) on 03/01/2013 2:57:31 PM            MDM   1. Chest pain   2. Numbness   3. URI (upper respiratory infection)     patient with cough and chest pain. EKG reassuring. Enzymes negative. Likely related to URI. Patient also episode of left hand numbness. No deficits now. Discussed with neurology. MRI ordered. He was negative for stroke. He did show sinusitis. Patient be treated with antibiotics to cover for 2  weeks of cough and will followup with family practice Center.     Jasper Riling. Alvino Chapel, MD 03/01/13 1529

## 2013-03-01 NOTE — ED Notes (Signed)
Patient transported to MRI 

## 2013-03-01 NOTE — ED Notes (Signed)
Pt reports body aches, cp, sob, productive cough x 2 weeks.

## 2013-03-01 NOTE — ED Notes (Signed)
Discharge instructions reviewed with pt. Pt verbalized understanding.   

## 2013-03-01 NOTE — Discharge Instructions (Signed)
Follow with Family practice for the numbness in the left hand.  Upper Respiratory Infection, Adult An upper respiratory infection (URI) is also sometimes known as the common cold. The upper respiratory tract includes the nose, sinuses, throat, trachea, and bronchi. Bronchi are the airways leading to the lungs. Most people improve within 1 week, but symptoms can last up to 2 weeks. A residual cough may last even longer.  CAUSES Many different viruses can infect the tissues lining the upper respiratory tract. The tissues become irritated and inflamed and often become very moist. Mucus production is also common. A cold is contagious. You can easily spread the virus to others by oral contact. This includes kissing, sharing a glass, coughing, or sneezing. Touching your mouth or nose and then touching a surface, which is then touched by another person, can also spread the virus. SYMPTOMS  Symptoms typically develop 1 to 3 days after you come in contact with a cold virus. Symptoms vary from person to person. They may include:  Runny nose.  Sneezing.  Nasal congestion.  Sinus irritation.  Sore throat.  Loss of voice (laryngitis).  Cough.  Fatigue.  Muscle aches.  Loss of appetite.  Headache.  Low-grade fever. DIAGNOSIS  You might diagnose your own cold based on familiar symptoms, since most people get a cold 2 to 3 times a year. Your caregiver can confirm this based on your exam. Most importantly, your caregiver can check that your symptoms are not due to another disease such as strep throat, sinusitis, pneumonia, asthma, or epiglottitis. Blood tests, throat tests, and X-rays are not necessary to diagnose a common cold, but they may sometimes be helpful in excluding other more serious diseases. Your caregiver will decide if any further tests are required. RISKS AND COMPLICATIONS  You may be at risk for a more severe case of the common cold if you smoke cigarettes, have chronic heart  disease (such as heart failure) or lung disease (such as asthma), or if you have a weakened immune system. The very young and very old are also at risk for more serious infections. Bacterial sinusitis, middle ear infections, and bacterial pneumonia can complicate the common cold. The common cold can worsen asthma and chronic obstructive pulmonary disease (COPD). Sometimes, these complications can require emergency medical care and may be life-threatening. PREVENTION  The best way to protect against getting a cold is to practice good hygiene. Avoid oral or hand contact with people with cold symptoms. Wash your hands often if contact occurs. There is no clear evidence that vitamin C, vitamin E, echinacea, or exercise reduces the chance of developing a cold. However, it is always recommended to get plenty of rest and practice good nutrition. TREATMENT  Treatment is directed at relieving symptoms. There is no cure. Antibiotics are not effective, because the infection is caused by a virus, not by bacteria. Treatment may include:  Increased fluid intake. Sports drinks offer valuable electrolytes, sugars, and fluids.  Breathing heated mist or steam (vaporizer or shower).  Eating chicken soup or other clear broths, and maintaining good nutrition.  Getting plenty of rest.  Using gargles or lozenges for comfort.  Controlling fevers with ibuprofen or acetaminophen as directed by your caregiver.  Increasing usage of your inhaler if you have asthma. Zinc gel and zinc lozenges, taken in the first 24 hours of the common cold, can shorten the duration and lessen the severity of symptoms. Pain medicines may help with fever, muscle aches, and throat pain. A variety of  non-prescription medicines are available to treat congestion and runny nose. Your caregiver can make recommendations and may suggest nasal or lung inhalers for other symptoms.  HOME CARE INSTRUCTIONS   Only take over-the-counter or prescription  medicines for pain, discomfort, or fever as directed by your caregiver.  Use a warm mist humidifier or inhale steam from a shower to increase air moisture. This may keep secretions moist and make it easier to breathe.  Drink enough water and fluids to keep your urine clear or pale yellow.  Rest as needed.  Return to work when your temperature has returned to normal or as your caregiver advises. You may need to stay home longer to avoid infecting others. You can also use a face mask and careful hand washing to prevent spread of the virus. SEEK MEDICAL CARE IF:   After the first few days, you feel you are getting worse rather than better.  You need your caregiver's advice about medicines to control symptoms.  You develop chills, worsening shortness of breath, or brown or red sputum. These may be signs of pneumonia.  You develop yellow or brown nasal discharge or pain in the face, especially when you bend forward. These may be signs of sinusitis.  You develop a fever, swollen neck glands, pain with swallowing, or white areas in the back of your throat. These may be signs of strep throat. SEEK IMMEDIATE MEDICAL CARE IF:   You have a fever.  You develop severe or persistent headache, ear pain, sinus pain, or chest pain.  You develop wheezing, a prolonged cough, cough up blood, or have a change in your usual mucus (if you have chronic lung disease).  You develop sore muscles or a stiff neck. Document Released: 07/28/2000 Document Revised: 04/26/2011 Document Reviewed: 06/05/2010 [facility] Patient Information 2014 [place], [place].

## 2013-03-22 ENCOUNTER — Ambulatory Visit (INDEPENDENT_AMBULATORY_CARE_PROVIDER_SITE_OTHER): Payer: Medicare HMO | Admitting: Family Medicine

## 2013-03-22 ENCOUNTER — Encounter: Payer: Self-pay | Admitting: Family Medicine

## 2013-03-22 VITALS — BP 156/97 | HR 80 | Temp 98.7°F | Ht 66.0 in | Wt 190.0 lb

## 2013-03-22 DIAGNOSIS — N644 Mastodynia: Secondary | ICD-10-CM

## 2013-03-22 DIAGNOSIS — M25569 Pain in unspecified knee: Secondary | ICD-10-CM

## 2013-03-22 MED ORDER — LEVOCETIRIZINE DIHYDROCHLORIDE 5 MG PO TABS: 5.0000 mg | ORAL_TABLET | Freq: Every evening | ORAL | Status: DC
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
## 2013-03-22 NOTE — Patient Instructions (Signed)
It was nice to see you today.   Your breast exam was normal but be sure to get your mammogram.   I have called in a medication for your sinuses.  Follow up with me at least annually.

## 2013-03-23 DIAGNOSIS — N644 Mastodynia: Secondary | ICD-10-CM | POA: Insufficient documentation

## 2013-03-23 DIAGNOSIS — M25569 Pain in unspecified knee: Secondary | ICD-10-CM

## 2013-03-23 MED ORDER — METHYLPREDNISOLONE ACETATE 40 MG/ML IJ SUSP
40.0000 mg | Freq: Once | INTRAMUSCULAR | Status: AC
  Administered 2013-03-23: 40 mg via INTRA_ARTICULAR

## 2013-03-23 NOTE — Assessment & Plan Note (Signed)
Normal breast exam today.  No palpable masses appreciated. She was reassured today.  Advised patient to obtain mammogram in the near future (given complaints; patient also due for mammogram).

## 2013-03-23 NOTE — Assessment & Plan Note (Signed)
Steroid injection performed today. Patient to continue knee sleeve and PRN Tylenol. Advised cautious use of NSAIDs if needed for pain control.

## 2013-03-29 ENCOUNTER — Other Ambulatory Visit: Payer: Self-pay | Admitting: Family Medicine

## 2013-03-29 DIAGNOSIS — Z1231 Encounter for screening mammogram for malignant neoplasm of breast: Secondary | ICD-10-CM

## 2013-04-20 ENCOUNTER — Ambulatory Visit (HOSPITAL_COMMUNITY): Admission: RE | Admit: 2013-04-20 | Payer: Medicare HMO | Source: Ambulatory Visit

## 2013-04-26 ENCOUNTER — Ambulatory Visit (HOSPITAL_COMMUNITY): Payer: Medicare HMO | Attending: Family Medicine

## 2013-05-09 ENCOUNTER — Ambulatory Visit (HOSPITAL_COMMUNITY)
Admission: RE | Admit: 2013-05-09 | Discharge: 2013-05-09 | Disposition: A | Discharge status: Home / Self Care | Payer: Medicare HMO | Source: Ambulatory Visit | Attending: Family Medicine | Admitting: Family Medicine

## 2013-05-09 DIAGNOSIS — Z1231 Encounter for screening mammogram for malignant neoplasm of breast: Secondary | ICD-10-CM | POA: Insufficient documentation

## 2013-05-14 ENCOUNTER — Encounter: Payer: Self-pay | Admitting: Family Medicine

## 2013-06-07 ENCOUNTER — Telehealth: Payer: Self-pay | Admitting: Family Medicine

## 2013-06-07 MED ORDER — LISINOPRIL 20 MG PO TABS: 20.0000 mg | ORAL_TABLET | Freq: Every day | ORAL | Status: DC

## 2013-06-07 MED ORDER — POTASSIUM CHLORIDE CRYS ER 10 MEQ PO TBCR: 10.0000 meq | EXTENDED_RELEASE_TABLET | Freq: Every day | ORAL | Status: DC

## 2013-06-07 MED ORDER — ALBUTEROL SULFATE HFA 108 (90 BASE) MCG/ACT IN AERS: 2.0000 | INHALATION_SPRAY | Freq: Four times a day (QID) | RESPIRATORY_TRACT | Status: DC | PRN

## 2013-06-07 NOTE — Telephone Encounter (Signed)
Please refill meds for [place], albuterol and Lisinopril to [facility] on [address].  Only have 2 pills left.

## 2013-06-07 NOTE — Telephone Encounter (Signed)
Rx's refilled. 

## 2013-08-10 ENCOUNTER — Encounter: Payer: Self-pay | Admitting: Family Medicine

## 2013-08-10 ENCOUNTER — Ambulatory Visit (INDEPENDENT_AMBULATORY_CARE_PROVIDER_SITE_OTHER): Payer: Commercial Managed Care - HMO | Admitting: Family Medicine

## 2013-08-10 VITALS — BP 141/60 | HR 82 | Temp 98.8°F | Ht 66.0 in | Wt 188.0 lb

## 2013-08-10 DIAGNOSIS — R42 Dizziness and giddiness: Secondary | ICD-10-CM

## 2013-08-10 DIAGNOSIS — R5381 Other malaise: Secondary | ICD-10-CM

## 2013-08-10 DIAGNOSIS — R5383 Other fatigue: Secondary | ICD-10-CM

## 2013-08-10 MED ORDER — FLUTICASONE PROPIONATE 50 MCG/ACT NA SUSP: 2.0000 | Freq: Every day | NASAL | Status: DC

## 2013-08-10 MED ORDER — MECLIZINE HCL 50 MG PO TABS: 50.0000 mg | ORAL_TABLET | Freq: Three times a day (TID) | ORAL | Status: DC | PRN

## 2013-08-10 MED ORDER — LEVOCETIRIZINE DIHYDROCHLORIDE 5 MG PO TABS
5.0000 mg | ORAL_TABLET | Freq: Every evening | ORAL | Status: DC
Start: 2013-08-10 — End: 2013-09-13

## 2013-08-10 NOTE — Patient Instructions (Signed)
It was nice to see you.  I have prescribed two medications for your allergies.  I have also given you Meclizine for your dizziness.  Follow up in ~ 1-3 months if no improvement.

## 2013-08-10 NOTE — Progress Notes (Signed)
   Subjective:    Patient ID: Susan Ingram, female    DOB: [birth year], 73 y.o.   MRN: [id number]  HPI 73 year old female presents for a same day appointment with [facility] D[facility].  1) Dizziness - Has been intermittent x 1 month.  - Happens more often in the evenings. - She describes it as "if I'm going to pass out" - Denies vertigo, hearing loss, tinnitus.  2) Fatigue - In addition to dizziness, Ms. Sprankle reports generalized fatigue that she has had for months. She has had previous work up (CBC, CMP, TSH, B12) which was unremarkable. - She reports that she just feels "tired". - She states that she is very tired/fatigued after leaving work.  She reports that she would like to retire but can't afford to do so. - No associated symptoms: weight loss/gain, hematochezia/melena, SOB, chest pain.   - Upon ROS, she did report that she does not sleep well at night.  She has trouble falling asleep and stays up late at night.  Review of Systems Per HPI     Objective:   Physical Exam Filed Vitals:   08/10/13 1411  BP: 141/60  Pulse: 82  Temp: 98.8 F (37.1 C)   Exam: General: well appearing, NAD. Cardiovascular: RRR. No murmurs, rubs, or gallops. Respiratory: CTAB. No rales, rhonchi, or wheeze. Abdomen: soft, nontender, nondistended. Extremities: Trace - 1+ LE edema. Neuro: No focal deficits.    Assessment & Plan:  See Problem list

## 2013-08-11 DIAGNOSIS — R42 Dizziness and giddiness: Secondary | ICD-10-CM | POA: Insufficient documentation

## 2013-08-11 HISTORY — DX: Dizziness and giddiness: R42

## 2013-08-11 NOTE — Assessment & Plan Note (Signed)
Prior workup unremarkable. Likely secondary to poor sleep/sleep hygiene.  Discuss proper sleep hygiene with patient today.  Offered Rx for insomnia and patient declined. Will continue to follow closely.

## 2013-08-11 NOTE — Assessment & Plan Note (Signed)
Unclear etiology. Will try Meclizine with close follow up.

## 2013-08-13 ENCOUNTER — Telehealth: Payer: Self-pay | Admitting: Family Medicine

## 2013-08-13 MED ORDER — BENZONATATE 100 MG PO CAPS: 100.0000 mg | ORAL_CAPSULE | Freq: Three times a day (TID) | ORAL | Status: DC | PRN

## 2013-08-13 NOTE — Telephone Encounter (Signed)
Rx sent 

## 2013-08-13 NOTE — Telephone Encounter (Signed)
Patient seen in office on 6/26. Patient states her cough has worsen and would like a cough syrup called in. Please advise.

## 2013-08-31 ENCOUNTER — Encounter (HOSPITAL_COMMUNITY): Payer: Self-pay | Admitting: Emergency Medicine

## 2013-08-31 ENCOUNTER — Emergency Department (INDEPENDENT_AMBULATORY_CARE_PROVIDER_SITE_OTHER)
Admission: EM | Admit: 2013-08-31 | Discharge: 2013-08-31 | Disposition: A | Discharge status: Home / Self Care | Payer: Commercial Managed Care - HMO | Source: Home / Self Care | Attending: Family Medicine | Admitting: Family Medicine

## 2013-08-31 ENCOUNTER — Emergency Department (INDEPENDENT_AMBULATORY_CARE_PROVIDER_SITE_OTHER): Payer: Commercial Managed Care - HMO

## 2013-08-31 DIAGNOSIS — J40 Bronchitis, not specified as acute or chronic: Secondary | ICD-10-CM

## 2013-08-31 MED ORDER — IPRATROPIUM-ALBUTEROL 0.5-2.5 (3) MG/3ML IN SOLN
RESPIRATORY_TRACT | Status: AC
  Filled 2013-08-31: qty 3

## 2013-08-31 MED ORDER — TRAMADOL HCL 50 MG PO TABS: 50.0000 mg | ORAL_TABLET | Freq: Every evening | ORAL | Status: DC | PRN

## 2013-08-31 MED ORDER — IPRATROPIUM-ALBUTEROL 0.5-2.5 (3) MG/3ML IN SOLN
3.0000 mL | Freq: Once | RESPIRATORY_TRACT | Status: AC
  Administered 2013-08-31: 3 mL via RESPIRATORY_TRACT

## 2013-08-31 MED ORDER — PREDNISONE 10 MG PO TABS: 30.0000 mg | ORAL_TABLET | Freq: Every day | ORAL | Status: DC

## 2013-08-31 NOTE — ED Provider Notes (Signed)
Susan Ingram is a 73 y.o. female who presents to Urgent Care today for cough. Patient has a three-week history of cough with clear sputum. She notes mild wheezing and shortness of breath. She does have pain in the left side of her neck. She denies any fevers or chills nausea vomiting or diarrhea. She has tried ibuprofen and albuterol which helps some. She feels well otherwise.   Past Medical History  Diagnosis Date  . Cancer     lung  . Hypertension   . Hypercholesterolemia   . Eczema   . Asthma    History  Substance Use Topics  . Smoking status: Former Smoker    Quit date: 04/15/1993  . Smokeless tobacco: Not on file  . Alcohol Use: No   ROS as above Medications: No current facility-administered medications for this encounter.   Current Outpatient Prescriptions  Medication Sig Dispense Refill  . acetaminophen (TYLENOL) 500 MG tablet Take 1,000 mg by mouth every 6 (six) hours as needed for mild pain.       Marland Kitchen albuterol (PROVENTIL HFA;VENTOLIN HFA) 108 (90 BASE) MCG/ACT inhaler Inhale 2 puffs into the lungs every 6 (six) hours as needed for wheezing or shortness of breath.  1 Inhaler  3  . benzonatate (TESSALON) 100 MG capsule Take 1 capsule (100 mg total) by mouth 3 (three) times daily as needed for cough.  90 capsule  0  . brompheniramine-pseudoephedrine-dextromethorphan (DIMETAPP DM) 15-1-5 MG/5ML ELIX Take 5 mLs by mouth every 4 (four) hours as needed (for cough).      . calcium-vitamin D 250-100 MG-UNIT per tablet Take 1 tablet by mouth 2 (two) times daily.        . fluticasone (FLONASE) 50 MCG/ACT nasal spray Place 2 sprays into both nostrils daily.  16 g  6  . furosemide (LASIX) 40 MG tablet Take 1 tablet (40 mg total) by mouth daily.  30 tablet  6  . levocetirizine (XYZAL) 5 MG tablet Take 1 tablet (5 mg total) by mouth every evening.  30 tablet  3  . levocetirizine (XYZAL) 5 MG tablet Take 1 tablet (5 mg total) by mouth every evening.  90 tablet  3  . lisinopril  (PRINIVIL,ZESTRIL) 20 MG tablet Take 1 tablet (20 mg total) by mouth daily.  90 tablet  3  . meclizine (ANTIVERT) 50 MG tablet Take 1 tablet (50 mg total) by mouth 3 (three) times daily as needed.  30 tablet  0  . potassium chloride (K-DUR,KLOR-CON) 10 MEQ tablet Take 1 tablet (10 mEq total) by mouth daily.  90 tablet  3  . predniSONE (DELTASONE) 10 MG tablet Take 3 tablets (30 mg total) by mouth daily.  15 tablet  0  . traMADol (ULTRAM) 50 MG tablet Take 1 tablet (50 mg total) by mouth at bedtime as needed (cough).  10 tablet  0  . [DISCONTINUED] potassium chloride (K-DUR) 10 MEQ tablet Take 1 tablet (10 mEq total) by mouth daily.  30 tablet  6    Exam:  BP 164/78  Pulse 80  Temp(Src) 98.3 F (36.8 C) (Oral)  Resp 16  SpO2 100% Gen: Well NAD HEENT: EOMI,  MMM Lungs: Normal work of breathing. Wheezing and coarse breath sounds present bilaterally Heart: RRR no MRG Abd: NABS, Soft. Nontender, Nondistended Exts: Brisk capillary refill, warm and well perfused.  Neck: Tender to palpation left trapezius. Full neck range of motion negative Spurling's test  Patient was given a DuoNeb nebulizer treatment and felt much better  No results found for this or any previous visit (from the past 24 hour(s)). Dg Chest 2 View  08/31/2013   CLINICAL DATA:  Cough for 3 weeks. Not getting better. Losing her voice. Neck pain. High blood pressure. History of asthma. Surgery in 2010 to remove heart of the lung.  EXAM: CHEST  2 VIEW  COMPARISON:  Chest x-ray on 03/01/2013  FINDINGS: The heart size is normal. Surgical clips are identified in the left hilar and upper lobe regions. Surgical clips are also identified in the superior mediastinum. There are no focal consolidations or pleural effusions. No pulmonary edema. Thoracic spondylosis noted.  IMPRESSION: 1. Postoperative changes. 2.  No evidence for acute  abnormality.   Electronically Signed   By: Shon Hale M.D.   On: 08/31/2013 17:07    Assessment and  Plan: 73 y.o. female with bronchitis versus asthma/COPD exacerbation.  Plan to treat with prednisone albuterol and tramadol for cough suppression.  Discussed warning signs or symptoms. Please see discharge instructions. Patient expresses understanding.   This note was created using Systems analyst. Any transcription errors are unintended.    Gregor Hams, MD 08/31/13 [phone]

## 2013-08-31 NOTE — Discharge Instructions (Signed)
Thank you for coming in today. Take prednisone daily for 5 days.  Use tramadol for cough as needed.  Use your albuterol as needed.  Call or go to the emergency room if you get worse, have trouble breathing, have chest pains, or palpitations.    Bronchitis Bronchitis is inflammation of the airways that extend from the windpipe into the lungs (bronchi). The inflammation often causes mucus to develop, which leads to a cough. If the inflammation becomes severe, it may cause shortness of breath. CAUSES  Bronchitis may be caused by:   Viral infections.   Bacteria.   Cigarette smoke.   Allergens, pollutants, and other irritants.  SIGNS AND SYMPTOMS  The most common symptom of bronchitis is a frequent cough that produces mucus. Other symptoms include:  Fever.   Body aches.   Chest congestion.   Chills.   Shortness of breath.   Sore throat.  DIAGNOSIS  Bronchitis is usually diagnosed through a medical history and physical exam. Tests, such as chest X-rays, are sometimes done to rule out other conditions.  TREATMENT  You may need to avoid contact with whatever caused the problem (smoking, for example). Medicines are sometimes needed. These may include:  Antibiotics. These may be prescribed if the condition is caused by bacteria.  Cough suppressants. These may be prescribed for relief of cough symptoms.   Inhaled medicines. These may be prescribed to help open your airways and make it easier for you to breathe.   Steroid medicines. These may be prescribed for those with recurrent (chronic) bronchitis. HOME CARE INSTRUCTIONS  Get plenty of rest.   Drink enough fluids to keep your urine clear or pale yellow (unless you have a medical condition that requires fluid restriction). Increasing fluids may help thin your secretions and will prevent dehydration.   Only take over-the-counter or prescription medicines as directed by your health care provider.  Only take  antibiotics as directed. Make sure you finish them even if you start to feel better.  Avoid secondhand smoke, irritating chemicals, and strong fumes. These will make bronchitis worse. If you are a smoker, quit smoking. Consider using nicotine gum or skin patches to help control withdrawal symptoms. Quitting smoking will help your lungs heal faster.   Put a cool-mist humidifier in your bedroom at night to moisten the air. This may help loosen mucus. Change the water in the humidifier daily. You can also run the hot water in your shower and sit in the bathroom with the door closed for 5-10 minutes.   Follow up with your health care provider as directed.   Wash your hands frequently to avoid catching bronchitis again or spreading an infection to others.  SEEK MEDICAL CARE IF: Your symptoms do not improve after 1 week of treatment.  SEEK IMMEDIATE MEDICAL CARE IF:  Your fever increases.  You have chills.   You have chest pain.   You have worsening shortness of breath.   You have bloody sputum.  You faint.  You have lightheadedness.  You have a severe headache.   You vomit repeatedly. MAKE SURE YOU:   Understand these instructions.  Will watch your condition.  Will get help right away if you are not doing well or get worse. Document Released: 02/01/2005 Document Revised: 11/22/2012 Document Reviewed: 09/26/2012 [facility] Patient Information 2015 [place], [place]. This information is not intended to replace advice given to you by your health care provider. Make sure you discuss any questions you have with your health care provider.

## 2013-08-31 NOTE — ED Notes (Signed)
Reports a cough for 3 weeks, initially cough was productive-clear.  Now cough is non-productive.   Patient also has left neck pain , pain radiates into left upper chest,  unable to turn head to the left.  Neck issue for 3 days

## 2013-08-31 NOTE — ED Notes (Signed)
Patient transported to X-ray 

## 2013-09-03 ENCOUNTER — Other Ambulatory Visit: Payer: Self-pay

## 2013-09-03 DIAGNOSIS — C3490 Malignant neoplasm of unspecified part of unspecified bronchus or lung: Secondary | ICD-10-CM

## 2013-09-06 ENCOUNTER — Emergency Department (INDEPENDENT_AMBULATORY_CARE_PROVIDER_SITE_OTHER)
Admission: EM | Admit: 2013-09-06 | Discharge: 2013-09-06 | Disposition: A | Discharge status: Home / Self Care | Payer: Commercial Managed Care - HMO | Source: Home / Self Care | Attending: Family Medicine | Admitting: Family Medicine

## 2013-09-06 ENCOUNTER — Encounter (HOSPITAL_COMMUNITY): Payer: Self-pay | Admitting: Emergency Medicine

## 2013-09-06 DIAGNOSIS — S161XXA Strain of muscle, fascia and tendon at neck level, initial encounter: Secondary | ICD-10-CM

## 2013-09-06 DIAGNOSIS — S139XXA Sprain of joints and ligaments of unspecified parts of neck, initial encounter: Secondary | ICD-10-CM

## 2013-09-06 DIAGNOSIS — X58XXXA Exposure to other specified factors, initial encounter: Secondary | ICD-10-CM

## 2013-09-06 MED ORDER — DICLOFENAC POTASSIUM 50 MG PO TABS: 50.0000 mg | ORAL_TABLET | Freq: Two times a day (BID) | ORAL | Status: DC

## 2013-09-06 MED ORDER — IBUPROFEN 800 MG PO TABS
ORAL_TABLET | ORAL | Status: AC
  Filled 2013-09-06: qty 1

## 2013-09-06 MED ORDER — IBUPROFEN 800 MG PO TABS
800.0000 mg | ORAL_TABLET | Freq: Once | ORAL | Status: AC
  Administered 2013-09-06: 800 mg via ORAL

## 2013-09-06 NOTE — Discharge Instructions (Signed)
Please use medication as prescribed for your pain and if symptoms do not improve, please follow up with your doctor at the [facility].   Cervical Sprain A cervical sprain is an injury in the neck in which the strong, fibrous tissues (ligaments) that connect your neck bones stretch or tear. Cervical sprains can range from mild to severe. Severe cervical sprains can cause the neck vertebrae to be unstable. This can lead to damage of the spinal cord and can result in serious nervous system problems. The amount of time it takes for a cervical sprain to get better depends on the cause and extent of the injury. Most cervical sprains heal in 1 to 3 weeks. CAUSES  Severe cervical sprains may be caused by:   Contact sport injuries (such as from football, rugby, wrestling, hockey, auto racing, gymnastics, diving, martial arts, or boxing).   Motor vehicle collisions.   Whiplash injuries. This is an injury from a sudden forward and backward whipping movement of the head and neck.  Falls.  Mild cervical sprains may be caused by:   Being in an awkward position, such as while cradling a telephone between your ear and shoulder.   Sitting in a chair that does not offer proper support.   Working at a poorly Landscape [employer] station.   Looking up or down for long periods of time.  SYMPTOMS   Pain, soreness, stiffness, or a burning sensation in the front, back, or sides of the neck. This discomfort may develop immediately after the injury or slowly, 24 hours or more after the injury.   Pain or tenderness directly in the middle of the back of the neck.   Shoulder or upper back pain.   Limited ability to move the neck.   Headache.   Dizziness.   Weakness, numbness, or tingling in the hands or arms.   Muscle spasms.   Difficulty swallowing or chewing.   Tenderness and swelling of the neck.  DIAGNOSIS  Most of the time your health care provider can diagnose a  cervical sprain by taking your history and doing a physical exam. Your health care provider will ask about previous neck injuries and any known neck problems, such as arthritis in the neck. X-rays may be taken to find out if there are any other problems, such as with the bones of the neck. Other tests, such as a CT scan or MRI, may also be needed.  TREATMENT  Treatment depends on the severity of the cervical sprain. Mild sprains can be treated with rest, keeping the neck in place (immobilization), and pain medicines. Severe cervical sprains are immediately immobilized. Further treatment is done to help with pain, muscle spasms, and other symptoms and may include:  Medicines, such as pain relievers, numbing medicines, or muscle relaxants.   Physical therapy. This may involve stretching exercises, strengthening exercises, and posture training. Exercises and improved posture can help stabilize the neck, strengthen muscles, and help stop symptoms from returning.  HOME CARE INSTRUCTIONS   Put ice on the injured area.   Put ice in a plastic bag.   Place a towel between your skin and the bag.   Leave the ice on for 15-20 minutes, 3-4 times a day.   If your injury was severe, you may have been given a cervical collar to wear. A cervical collar is a two-piece collar designed to keep your neck from moving while it heals.  Do not remove the collar unless instructed by your health care  provider.  If you have long hair, keep it outside of the collar.  Ask your health care provider before making any adjustments to your collar. Minor adjustments may be required over time to improve comfort and reduce pressure on your chin or on the back of your head.  Ifyou are allowed to remove the collar for cleaning or bathing, follow your health care provider's instructions on how to do so safely.  Keep your collar clean by wiping it with mild soap and water and drying it completely. If the collar you have  been given includes removable pads, remove them every 1-2 days and hand wash them with soap and water. Allow them to air dry. They should be completely dry before you wear them in the collar.  If you are allowed to remove the collar for cleaning and bathing, wash and dry the skin of your neck. Check your skin for irritation or sores. If you see any, tell your health care provider.  Do not drive while wearing the collar.   Only take over-the-counter or prescription medicines for pain, discomfort, or fever as directed by your health care provider.   Keep all follow-up appointments as directed by your health care provider.   Keep all physical therapy appointments as directed by your health care provider.   Make any needed adjustments to your workstation to promote good posture.   Avoid positions and activities that make your symptoms worse.   Warm up and stretch before being active to help prevent problems.  SEEK MEDICAL CARE IF:   Your pain is not controlled with medicine.   You are unable to decrease your pain medicine over time as planned.   Your activity level is not improving as expected.  SEEK IMMEDIATE MEDICAL CARE IF:   You develop any bleeding.  You develop stomach upset.  You have signs of an allergic reaction to your medicine.   Your symptoms get worse.   You develop new, unexplained symptoms.   You have numbness, tingling, weakness, or paralysis in any part of your body.  MAKE SURE YOU:   Understand these instructions.  Will watch your condition.  Will get help right away if you are not doing well or get worse. Document Released: 11/29/2006 Document Revised: 02/06/2013 Document Reviewed: 08/09/2012 [facility] Patient Information 2015 [place], [place]. This information is not intended to replace advice given to you by your health care provider. Make sure you discuss any questions you have with your health care provider.  Muscle Strain A muscle strain  is an injury that occurs when a muscle is stretched beyond its normal length. Usually a small number of muscle fibers are torn when this happens. Muscle strain is rated in degrees. First-degree strains have the least amount of muscle fiber tearing and pain. Second-degree and third-degree strains have increasingly more tearing and pain.  Usually, recovery from muscle strain takes 1-2 weeks. Complete healing takes 5-6 weeks.  CAUSES  Muscle strain happens when a sudden, violent force placed on a muscle stretches it too far. This may occur with lifting, sports, or a fall.  RISK FACTORS Muscle strain is especially common in athletes.  SIGNS AND SYMPTOMS At the site of the muscle strain, there may be:  Pain.  Bruising.  Swelling.  Difficulty using the muscle due to pain or lack of normal function. DIAGNOSIS  Your health care provider will perform a physical exam and ask about your medical history. TREATMENT  Often, the best treatment for a muscle strain  is resting, icing, and applying cold compresses to the injured area.  HOME CARE INSTRUCTIONS   Use the PRICE method of treatment to promote muscle healing during the first 2-3 days after your injury. The PRICE method involves:  Protecting the muscle from being injured again.  Restricting your activity and resting the injured body part.  Icing your injury. To do this, put ice in a plastic bag. Place a towel between your skin and the bag. Then, apply the ice and leave it on from 15-20 minutes each hour. After the third day, switch to moist heat packs.  Apply compression to the injured area with a splint or elastic bandage. Be careful not to wrap it too tightly. This may interfere with blood circulation or increase swelling.  Elevate the injured body part above the level of your heart as often as you can.  Only take over-the-counter or prescription medicines for pain, discomfort, or fever as directed by your health care provider.  Warming  up prior to exercise helps to prevent future muscle strains. SEEK MEDICAL CARE IF:   You have increasing pain or swelling in the injured area.  You have numbness, tingling, or a significant loss of strength in the injured area. MAKE SURE YOU:   Understand these instructions.  Will watch your condition.  Will get help right away if you are not doing well or get worse. Document Released: 02/01/2005 Document Revised: 11/22/2012 Document Reviewed: 08/31/2012 [facility] Patient Information 2015 [place], [place]. This information is not intended to replace advice given to you by your health care provider. Make sure you discuss any questions you have with your health care provider.

## 2013-09-06 NOTE — ED Provider Notes (Signed)
Medical screening examination/treatment/procedure(s) were performed by a resident physician or non-physician practitioner and as the supervising physician I was immediately available for consultation/collaboration.  Linna Darner, MD Family Medicine   Waldemar Dickens, MD 09/06/13 2129

## 2013-09-06 NOTE — ED Notes (Signed)
C/o left neck pain x 3 days States pain is sharp Tylenol was used as tx

## 2013-09-06 NOTE — ED Provider Notes (Signed)
CSN: [id number]     Arrival date & time 09/06/13  1607 History   First MD Initiated Contact with Patient 09/06/13 1616     Chief Complaint  Patient presents with  . Neck Pain   (Consider location/radiation/quality/duration/timing/severity/associated sxs/prior Treatment) HPI Comments: Patient presents with a 3 day history of intermittent, sharp, shooting left lateral neck pain. States pain occurs when she turns her head to the left or when she raises her left arm above her head. Denies fever, chills, headache. No known injury. Denies changes in strength of sensation of left upper extremity. No chest pain or dyspnea.  Reports recent URI with associated cough. Works in [employer] facility. PCP: [facility]   The history is provided by the patient.    Past Medical History  Diagnosis Date  . Cancer     lung  . Hypertension   . Hypercholesterolemia   . Eczema   . Asthma    Past Surgical History  Procedure Laterality Date  . Bronchoscopy, left video-assisted thoracoscopy, wedge  05/23/2008  . Left thyroid lobectomy, frozen section.  10/11/2002  . Vaginal hysterectomy     Family History  Problem Relation Age of Onset  . Alzheimer's disease Father   . Early death Mother   . Hypertension Mother   . Stroke Mother    History  Substance Use Topics  . Smoking status: Former Smoker    Quit date: 04/15/1993  . Smokeless tobacco: Not on file  . Alcohol Use: No   OB History   Grav Para Term Preterm Abortions TAB SAB Ect Mult Living                 Review of Systems  All other systems reviewed and are negative.   Allergies  Amlodipine; Maxzide; and Toprol xl  Home Medications   Prior to Admission medications   Medication Sig Start Date End Date Taking? Authorizing Provider  acetaminophen (TYLENOL) 500 MG tablet Take 1,000 mg by mouth every 6 (six) hours as needed for mild pain.     Historical Provider, MD  albuterol (PROVENTIL HFA;VENTOLIN HFA) 108 (90 BASE) MCG/ACT inhaler  Inhale 2 puffs into the lungs every 6 (six) hours as needed for wheezing or shortness of breath. 06/07/13   Coral Spikes, DO  benzonatate (TESSALON) 100 MG capsule Take 1 capsule (100 mg total) by mouth 3 (three) times daily as needed for cough. 08/13/13   Coral Spikes, DO  brompheniramine-pseudoephedrine-dextromethorphan (DIMETAPP DM) 15-1-5 MG/5ML ELIX Take 5 mLs by mouth every 4 (four) hours as needed (for cough).    Historical Provider, MD  calcium-vitamin D 250-100 MG-UNIT per tablet Take 1 tablet by mouth 2 (two) times daily.      Historical Provider, MD  diclofenac (CATAFLAM) 50 MG tablet Take 1 tablet (50 mg total) by mouth 2 (two) times daily. As needed for pain 09/06/13   Annett Gula Sadiel Mota, PA  fluticasone [facility]) 50 MCG/ACT nasal spray Place 2 sprays into both nostrils daily. 08/10/13   Coral Spikes, DO  furosemide (LASIX) 40 MG tablet Take 1 tablet (40 mg total) by mouth daily. 10/27/12   Coral Spikes, DO  levocetirizine (XYZAL) 5 MG tablet Take 1 tablet (5 mg total) by mouth every evening. 03/22/13   Coral Spikes, DO  levocetirizine (XYZAL) 5 MG tablet Take 1 tablet (5 mg total) by mouth every evening. 08/10/13   Coral Spikes, DO  lisinopril (PRINIVIL,ZESTRIL) 20 MG tablet Take 1 tablet (20 mg total)  by mouth daily. 06/07/13   Coral Spikes, DO  meclizine (ANTIVERT) 50 MG tablet Take 1 tablet (50 mg total) by mouth 3 (three) times daily as needed. 08/10/13   Coral Spikes, DO  potassium chloride (K-DUR,KLOR-CON) 10 MEQ tablet Take 1 tablet (10 mEq total) by mouth daily. 06/07/13   Coral Spikes, DO  predniSONE (DELTASONE) 10 MG tablet Take 3 tablets (30 mg total) by mouth daily. 08/31/13   Gregor Hams, MD  traMADol (ULTRAM) 50 MG tablet Take 1 tablet (50 mg total) by mouth at bedtime as needed (cough). 08/31/13   Gregor Hams, MD   BP 110/74  Pulse 63  Temp(Src) 99.1 F (37.3 C) (Oral)  Resp 16  SpO2 98% Physical Exam  Nursing note and vitals reviewed. Constitutional: She is oriented to  person, place, and time. She appears well-developed and well-nourished.  HENT:  Head: Normocephalic and atraumatic.  Eyes: Conjunctivae are normal. No scleral icterus.  Neck: Trachea normal, normal range of motion and phonation normal. Neck supple. Muscular tenderness present. No spinous process tenderness present. No rigidity. No edema and no erythema present. No mass and no thyromegaly present.    CSM exam of bilateral upper extremities normal  Cardiovascular: Normal rate, regular rhythm and normal heart sounds.   Pulmonary/Chest: Effort normal and breath sounds normal. No respiratory distress. She has no wheezes.  Musculoskeletal: Normal range of motion.  Neurological: She is alert and oriented to person, place, and time.  Skin: Skin is warm and dry. No rash noted. No erythema.  Psychiatric: She has a normal mood and affect. Her behavior is normal.    ED Course  Procedures (including critical care time) Labs Review Labs Reviewed - No data to display  Imaging Review No results found.   MDM   1. Cervical strain, initial encounter   Exam unremarkable for focal neurological deficit and I do not feel this discomfort is referred pain from cardiovascular issue. Appears to be a simple myofascial strain of left SCM muscle. Will advise Diclofenac as prescribed and PCP follow up if no improvement.     [place], [place] 09/06/13 [phone]

## 2013-09-13 ENCOUNTER — Ambulatory Visit (INDEPENDENT_AMBULATORY_CARE_PROVIDER_SITE_OTHER): Payer: Commercial Managed Care - HMO | Admitting: Family Medicine

## 2013-09-13 ENCOUNTER — Encounter: Payer: Self-pay | Admitting: Family Medicine

## 2013-09-13 VITALS — BP 161/72 | HR 79 | Ht 66.0 in | Wt 190.1 lb

## 2013-09-13 DIAGNOSIS — Z131 Encounter for screening for diabetes mellitus: Secondary | ICD-10-CM

## 2013-09-13 DIAGNOSIS — R059 Cough, unspecified: Secondary | ICD-10-CM

## 2013-09-13 DIAGNOSIS — R05 Cough: Secondary | ICD-10-CM

## 2013-09-13 DIAGNOSIS — M542 Cervicalgia: Secondary | ICD-10-CM

## 2013-09-13 LAB — POCT GLYCOSYLATED HEMOGLOBIN (HGB A1C): Hemoglobin A1C: 6.4

## 2013-09-13 MED ORDER — FLUTICASONE PROPIONATE 50 MCG/ACT NA SUSP: 2.0000 | Freq: Every day | NASAL | Status: DC

## 2013-09-13 MED ORDER — CETIRIZINE HCL 10 MG PO TABS: 10.0000 mg | ORAL_TABLET | Freq: Every day | ORAL | Status: DC

## 2013-09-13 MED ORDER — TIZANIDINE HCL 2 MG PO TABS: 2.0000 mg | ORAL_TABLET | Freq: Three times a day (TID) | ORAL | Status: DC | PRN

## 2013-09-13 NOTE — Patient Instructions (Signed)
It was nice to see you today.  Use the Zanaflex nightly (you may use up to 3 times daily) for spasm.  Use the flonase and Zyrtec for your cough.  Regarding your diabetes, continue diet and exercise.  Your A1c was 6.4 (pre-diabetic).

## 2013-09-14 NOTE — Progress Notes (Signed)
   Subjective:    Patient ID: Susan Ingram, female    DOB: 18-Dec-1940, 73 y.o.   MRN: [id number]  HPI 73 year old female with history of asthma, hyperlipidemia, hypertension, and prior history of non-small cell lung cancer presents for followup.  Patient has several issues she would like to discuss today.  1) Neck pain - Developed suddenly 2 weeks ago. No trauma, fall, injury. - She was seen at Urgent Care for this on 7/23 and was diagnosed with a myofascial strain.  She was given Diclofenac and told to follow up closely. - Today, she presents with continued pain.  She reports that she is unable to rotate her head to the left without pain.  She did not tolerate the diclofenac secondary to palpitations. - Pain is located in the anterior left neck at the [facility]. - No associated spinous process tenderness.  No associated numbness/tingling.  No recent fever, chills.   2) Screening for Diabetes - Patient very concerned about developing diabetes given family history. - She was screened last year and had an A1C of 6.1. - She desires screening today.  3) Cough - Patient reports cough that has been present for ~ 2 weeks - Cough is nonproductive. - No fever, chills, sinus tenderness, congestion, SOB, otalgia. - Patient reports that she is often trouble by allergies.  No reports of GERD/heartburn.     Review of Systems Per HPI    Objective:   Physical Exam Filed Vitals:   09/13/13 1558  BP: 161/72  Pulse: 79   Exam: General: well appearing elderly female in NAD. HEENT: NCAT.  Oropharynx clear. Neck:  Restricted Left rotation. Normal flexion/extension.  No spinous process tenderness.  Trapezius musculature tight/tense.   Cardiovascular: RRR. No murmurs, rubs, or gallops. Respiratory: CTAB. No rales, rhonchi, or wheeze.    Assessment & Plan:  See Problem List

## 2013-09-15 DIAGNOSIS — R0989 Other specified symptoms and signs involving the circulatory and respiratory systems: Secondary | ICD-10-CM

## 2013-09-15 DIAGNOSIS — R6889 Other general symptoms and signs: Secondary | ICD-10-CM

## 2013-09-15 DIAGNOSIS — M542 Cervicalgia: Secondary | ICD-10-CM | POA: Insufficient documentation

## 2013-09-15 HISTORY — DX: Other specified symptoms and signs involving the circulatory and respiratory systems: R09.89

## 2013-09-15 HISTORY — DX: Other general symptoms and signs: R68.89

## 2013-09-15 NOTE — Assessment & Plan Note (Addendum)
Isolated cough. Likely secondary to allergic symptoms. Will treat with Zyrtec and flonase.

## 2013-09-15 NOTE — Assessment & Plan Note (Signed)
Done today. A1C 6.4 (increased from 6.1). Advised well balanced diet and daily exercise.

## 2013-09-15 NOTE — Assessment & Plan Note (Signed)
Secondary to muscle strain.  Will treat with Flexeril. Advised heat, massage as well.

## 2013-10-02 ENCOUNTER — Ambulatory Visit (INDEPENDENT_AMBULATORY_CARE_PROVIDER_SITE_OTHER): Payer: Commercial Managed Care - HMO | Admitting: Thoracic Surgery (Cardiothoracic Vascular Surgery)

## 2013-10-02 ENCOUNTER — Encounter: Payer: Self-pay | Admitting: Thoracic Surgery (Cardiothoracic Vascular Surgery)

## 2013-10-02 ENCOUNTER — Ambulatory Visit
Admission: RE | Admit: 2013-10-02 | Discharge: 2013-10-02 | Disposition: A | Discharge status: Home / Self Care | Payer: Commercial Managed Care - HMO | Source: Ambulatory Visit | Attending: Thoracic Surgery (Cardiothoracic Vascular Surgery) | Admitting: Thoracic Surgery (Cardiothoracic Vascular Surgery)

## 2013-10-02 VITALS — BP 169/85 | HR 70 | Resp 20 | Ht 66.0 in | Wt 190.0 lb

## 2013-10-02 DIAGNOSIS — Z85118 Personal history of other malignant neoplasm of bronchus and lung: Secondary | ICD-10-CM

## 2013-10-02 DIAGNOSIS — C3490 Malignant neoplasm of unspecified part of unspecified bronchus or lung: Secondary | ICD-10-CM

## 2013-10-02 NOTE — Progress Notes (Signed)
HPI:  Susan Ingram returns today for a scheduled followup visit.  She is a 73 woman who had a lingular segmentectomy in April 2010 for a stage IA non-small cell carcinoma. Her last office visit was in January of this year which time she was doing well with no evidence of disease.  She says that she is doing well overall. She has been having some problems with muscle spasms in her neck. She continues to work. She's not had any difficulty with her breathing. She's had no more than an occasional cough. Her weight has been stable. She's not having unusual headaches or visual changes.  Past Medical History  Diagnosis Date  . Cancer     lung  . Hypertension   . Hypercholesterolemia   . Eczema   . Asthma      Current Outpatient Prescriptions  Medication Sig Dispense Refill  . acetaminophen (TYLENOL) 500 MG tablet Take 1,000 mg by mouth every 6 (six) hours as needed for mild pain.       Marland Kitchen albuterol (PROVENTIL HFA;VENTOLIN HFA) 108 (90 BASE) MCG/ACT inhaler Inhale 2 puffs into the lungs every 6 (six) hours as needed for wheezing or shortness of breath.  1 Inhaler  3  . benzonatate (TESSALON) 100 MG capsule Take 1 capsule (100 mg total) by mouth 3 (three) times daily as needed for cough.  90 capsule  0  . calcium-vitamin D 250-100 MG-UNIT per tablet Take 1 tablet by mouth 2 (two) times daily.        . cetirizine (ZYRTEC) 10 MG tablet Take 1 tablet (10 mg total) by mouth daily.  30 tablet  3  . diclofenac (CATAFLAM) 50 MG tablet Take 1 tablet (50 mg total) by mouth 2 (two) times daily. As needed for pain  20 tablet  0  . fluticasone (FLONASE) 50 MCG/ACT nasal spray Place 2 sprays into both nostrils daily.  16 g  6  . furosemide (LASIX) 40 MG tablet Take 1 tablet (40 mg total) by mouth daily.  30 tablet  6  . lisinopril (PRINIVIL,ZESTRIL) 20 MG tablet Take 1 tablet (20 mg total) by mouth daily.  90 tablet  3  . potassium chloride (K-DUR,KLOR-CON) 10 MEQ tablet Take 1 tablet (10 mEq total) by  mouth daily.  90 tablet  3  . predniSONE (DELTASONE) 10 MG tablet Take 3 tablets (30 mg total) by mouth daily.  15 tablet  0  . tiZANidine (ZANAFLEX) 2 MG tablet Take 1 tablet (2 mg total) by mouth every 8 (eight) hours as needed for muscle spasms.  60 tablet  0  . traMADol (ULTRAM) 50 MG tablet Take 1 tablet (50 mg total) by mouth at bedtime as needed (cough).  10 tablet  0  . [DISCONTINUED] potassium chloride (K-DUR) 10 MEQ tablet Take 1 tablet (10 mEq total) by mouth daily.  30 tablet  6   No current facility-administered medications for this visit.    Physical Exam BP 169/85  Pulse 70  Resp 20  Ht 5\' 6"  (1.676 m)  Wt 190 lb (86.183 kg)  BMI 30.68 kg/m2  SpO61 31% 73 year old woman in no acute distress Alert and oriented x3 with no focal neurologic deficits Lungs clear with equal breath sounds bilaterally No cervical or subclavicular or epitrochlear adenopathy  Diagnostic Tests: CT chest 10/02/2013 COMPARISON: Chest CTs dated 02/27/2013 and 07/25/2012.  FINDINGS:  Mediastinum: As evaluated in the noncontrast state, the mediastinum  has a stable appearance without evidence of mediastinal or hilar  lymphadenopathy. The right thyroid lobe appears stable. The left  thyroid lobe has been previously resected. The trachea and esophagus  appear stable. The heart size is normal. Minimal atherosclerosis  appears stable.  Lungs/Pleura: There is no pleural or pericardial effusion.There are  stable postsurgical changes status post wedge resection in the left  upper lobe. There is no evidence of pulmonary nodule or  endobronchial lesion.  Upper abdomen: The adrenal glands appear stable without suspicious  findings. There has been further slight enlargement of a cyst  involving the upper pole of the right kidney, now measuring 5.0 cm.  The liver demonstrates low density consistent with steatosis.  Musculoskeletal/Chest wall: No chest wall lesion or suspicious  osseous findings.   IMPRESSION:  Stable chest CT. No evidence of local recurrence or metastatic  disease.  Electronically Signed  By: Camie Patience M.D.  On: 10/02/2013 09:48  Impression: 73 year old woman who is now a little over 5 years out from a lingular segmentectomy for stage IA non-small cell carcinoma. She has no evidence recurrent disease. This point we consider her cured.  She will be 73 and a year and aged out of the criteria for low dose CT scan. No further followup is recommended unless she has symptoms to suggest recurrence.  Plan: I will be happy to see her back any time the future if I can be of any further assistance with her care.

## 2013-10-09 ENCOUNTER — Ambulatory Visit: Payer: Commercial Managed Care - HMO | Admitting: Thoracic Surgery (Cardiothoracic Vascular Surgery)

## 2013-10-09 ENCOUNTER — Other Ambulatory Visit: Payer: Commercial Managed Care - HMO

## 2013-11-22 ENCOUNTER — Other Ambulatory Visit: Payer: Self-pay | Admitting: Family Medicine

## 2013-11-22 MED ORDER — POTASSIUM CHLORIDE CRYS ER 10 MEQ PO TBCR: 10.0000 meq | EXTENDED_RELEASE_TABLET | Freq: Every day | ORAL | Status: DC

## 2013-11-22 MED ORDER — FUROSEMIDE 40 MG PO TABS: 40.0000 mg | ORAL_TABLET | Freq: Every day | ORAL | Status: DC

## 2013-11-22 MED ORDER — LISINOPRIL 20 MG PO TABS: 20.0000 mg | ORAL_TABLET | Freq: Every day | ORAL | Status: DC

## 2013-11-22 NOTE — Telephone Encounter (Signed)
Needs refills on klor-con, lisinopril, florsemide Has 2 days left

## 2013-11-23 NOTE — Telephone Encounter (Signed)
Attempted to call patient, no answer 

## 2013-12-12 ENCOUNTER — Ambulatory Visit (INDEPENDENT_AMBULATORY_CARE_PROVIDER_SITE_OTHER): Payer: Commercial Managed Care - HMO | Admitting: *Deleted

## 2013-12-12 DIAGNOSIS — Z23 Encounter for immunization: Secondary | ICD-10-CM

## 2013-12-19 ENCOUNTER — Emergency Department (HOSPITAL_COMMUNITY): Payer: Medicare HMO

## 2013-12-19 ENCOUNTER — Encounter (HOSPITAL_COMMUNITY): Payer: Self-pay

## 2013-12-19 ENCOUNTER — Emergency Department (HOSPITAL_COMMUNITY)
Admission: EM | Admit: 2013-12-19 | Discharge: 2013-12-19 | Disposition: A | Discharge status: Home / Self Care | Payer: Medicare HMO | Attending: Emergency Medicine | Admitting: Emergency Medicine

## 2013-12-19 DIAGNOSIS — Z85118 Personal history of other malignant neoplasm of bronchus and lung: Secondary | ICD-10-CM | POA: Diagnosis not present

## 2013-12-19 DIAGNOSIS — Z79899 Other long term (current) drug therapy: Secondary | ICD-10-CM | POA: Insufficient documentation

## 2013-12-19 DIAGNOSIS — Z872 Personal history of diseases of the skin and subcutaneous tissue: Secondary | ICD-10-CM | POA: Insufficient documentation

## 2013-12-19 DIAGNOSIS — Z7952 Long term (current) use of systemic steroids: Secondary | ICD-10-CM | POA: Insufficient documentation

## 2013-12-19 DIAGNOSIS — R0789 Other chest pain: Secondary | ICD-10-CM | POA: Insufficient documentation

## 2013-12-19 DIAGNOSIS — I10 Essential (primary) hypertension: Secondary | ICD-10-CM | POA: Insufficient documentation

## 2013-12-19 DIAGNOSIS — R079 Chest pain, unspecified: Secondary | ICD-10-CM

## 2013-12-19 DIAGNOSIS — J45909 Unspecified asthma, uncomplicated: Secondary | ICD-10-CM | POA: Diagnosis not present

## 2013-12-19 DIAGNOSIS — Z87891 Personal history of nicotine dependence: Secondary | ICD-10-CM | POA: Diagnosis not present

## 2013-12-19 DIAGNOSIS — Z8639 Personal history of other endocrine, nutritional and metabolic disease: Secondary | ICD-10-CM | POA: Diagnosis not present

## 2013-12-19 LAB — CBC
HCT: 39.1 % (ref 36.0–46.0)
Hemoglobin: 13 g/dL (ref 12.0–15.0)
MCH: 28.1 pg (ref 26.0–34.0)
MCHC: 33.2 g/dL (ref 30.0–36.0)
MCV: 84.6 fL (ref 78.0–100.0)
Platelets: 275 10*3/uL (ref 150–400)
RBC: 4.62 MIL/uL (ref 3.87–5.11)
RDW: 15 % (ref 11.5–15.5)
WBC: 5.4 10*3/uL (ref 4.0–10.5)

## 2013-12-19 LAB — BASIC METABOLIC PANEL
Anion gap: 11 (ref 5–15)
BUN: 9 mg/dL (ref 6–23)
CO2: 27 mEq/L (ref 19–32)
Calcium: 9.5 mg/dL (ref 8.4–10.5)
Chloride: 103 mEq/L (ref 96–112)
Creatinine, Ser: 0.74 mg/dL (ref 0.50–1.10)
GFR calc Af Amer: >90 mL/min (ref >90)
GFR calc non Af Amer: 82 mL/min — ABNORMAL LOW (ref >90)
Glucose, Bld: 90 mg/dL (ref 70–99)
Potassium: 4.2 mEq/L (ref 3.7–5.3)
Sodium: 141 mEq/L (ref 137–147)

## 2013-12-19 LAB — PRO B NATRIURETIC PEPTIDE: Pro B Natriuretic peptide (BNP): 63.2 pg/mL (ref 0–125)

## 2013-12-19 LAB — I-STAT TROPONIN, ED: Troponin i, poc: 0 ng/mL (ref 0.00–0.08)

## 2013-12-19 MED ORDER — METHOCARBAMOL 500 MG PO TABS: 500.0000 mg | ORAL_TABLET | Freq: Two times a day (BID) | ORAL | Status: DC

## 2013-12-19 NOTE — ED Provider Notes (Signed)
CSN: [id number]     Arrival date & time 12/19/13  1622 History   First MD Initiated Contact with Patient 12/19/13 [place]     Chief Complaint  Patient presents with  . Chest Pain     (Consider location/radiation/quality/duration/timing/severity/associated sxs/prior Treatment) HPI Comments: Patient here complaining of constant midsternal chest discomfort that started this morning. 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 No reported trauma or falls. No associated diaphoresis or dyspnea. Symptoms are persistent and better with remaining still. No fever or cough or congestion noted. No leg pain or swelling. No treatment used prior to arrival.  Patient is a 73 y.o. female presenting with chest pain. The history is provided by the patient.  Chest Pain   Past Medical History  Diagnosis Date  . Cancer     lung  . Hypertension   . Hypercholesterolemia   . Eczema   . Asthma    Past Surgical History  Procedure Laterality Date  . Bronchoscopy, left video-assisted thoracoscopy, wedge  05/23/2008  . Left thyroid lobectomy, frozen section.  10/11/2002  . Vaginal hysterectomy     Family History  Problem Relation Age of Onset  . Alzheimer's disease Father   . Early death Mother   . Hypertension Mother   . Stroke Mother    History  Substance Use Topics  . Smoking status: Former Smoker    Quit date: 04/15/1993  . Smokeless tobacco: Not on file  . Alcohol Use: No   OB History    No data available     Review of Systems  Cardiovascular: Positive for chest pain.  All other systems reviewed and are negative.     Allergies  Amlodipine; Maxzide; and Toprol xl  Home Medications   Prior to Admission medications   Medication Sig Start Date End Date Taking? Authorizing Provider  acetaminophen (TYLENOL) 500 MG tablet Take 1,000 mg by mouth every 6 (six) hours as needed for mild pain.     Historical Provider, MD    albuterol (PROVENTIL HFA;VENTOLIN HFA) 108 (90 BASE) MCG/ACT inhaler Inhale 2 puffs into the lungs every 6 (six) hours as needed for wheezing or shortness of breath. 06/07/13   Coral Spikes, DO  benzonatate (TESSALON) 100 MG capsule Take 1 capsule (100 mg total) by mouth 3 (three) times daily as needed for cough. 08/13/13   Coral Spikes, DO  calcium-vitamin D 250-100 MG-UNIT per tablet Take 1 tablet by mouth 2 (two) times daily.      Historical Provider, MD  cetirizine (ZYRTEC) 10 MG tablet Take 1 tablet (10 mg total) by mouth daily. 09/13/13   Coral Spikes, DO  diclofenac (CATAFLAM) 50 MG tablet Take 1 tablet (50 mg total) by mouth 2 (two) times daily. As needed for pain 09/06/13   Audelia Hives Presson, PA  fluticasone (FLONASE) 50 MCG/ACT nasal spray Place 2 sprays into both nostrils daily. 09/13/13   Coral Spikes, DO  furosemide (LASIX) 40 MG tablet Take 1 tablet (40 mg total) by mouth daily. 11/22/13   Coral Spikes, DO  lisinopril (PRINIVIL,ZESTRIL) 20 MG tablet Take 1 tablet (20 mg total) by mouth daily. 11/22/13   Coral Spikes, DO  potassium chloride (K-DUR,KLOR-CON) 10 MEQ tablet Take 1 tablet (10 mEq total) by mouth daily. 11/22/13   Coral Spikes, DO  predniSONE (DELTASONE) 10 MG tablet Take 3 tablets (30 mg total) by mouth daily. 08/31/13  Gregor Hams, MD  tiZANidine (ZANAFLEX) 2 MG tablet Take 1 tablet (2 mg total) by mouth every 8 (eight) hours as needed for muscle spasms. 09/13/13   Coral Spikes, DO  traMADol (ULTRAM) 50 MG tablet Take 1 tablet (50 mg total) by mouth at bedtime as needed (cough). 08/31/13   Gregor Hams, MD   BP 178/71 mmHg  Pulse 66  Temp(Src) 97.7 F (36.5 C) (Oral)  Resp 16  SpO2 99% Physical Exam  Constitutional: She is oriented to person, place, and time. She appears well-developed and well-nourished.  Non-toxic appearance. No distress.  HENT:  Head: Normocephalic and atraumatic.  Eyes: Conjunctivae, EOM and lids are normal. Pupils are equal, round, and reactive to  light.  Neck: Normal range of motion. Neck supple. No tracheal deviation present. No thyroid mass present.  Cardiovascular: Normal rate, regular rhythm and normal heart sounds.  Exam reveals no gallop.   No murmur heard. Pulmonary/Chest: Effort normal and breath sounds normal. No stridor. No respiratory distress. She has no decreased breath sounds. She has no wheezes. She has no rhonchi. She has no rales. She exhibits bony tenderness. She exhibits no crepitus.    Abdominal: Soft. Normal appearance and bowel sounds are normal. She exhibits no distension. There is no tenderness. There is no rebound and no CVA tenderness.  Musculoskeletal: Normal range of motion. She exhibits no edema or tenderness.  Neurological: She is alert and oriented to person, place, and time. She has normal strength. No cranial nerve deficit or sensory deficit. GCS eye subscore is 4. GCS verbal subscore is 5. GCS motor subscore is 6.  Skin: Skin is warm and dry. No abrasion and no rash noted.  Psychiatric: She has a normal mood and affect. Her speech is normal and behavior is normal.  Nursing note and vitals reviewed.   ED Course  Procedures (including critical care time) Labs Review Labs Reviewed  BASIC METABOLIC PANEL - Abnormal; Notable for the following:    GFR calc non Af Amer 82 (*)    All other components within normal limits  CBC  PRO B NATRIURETIC PEPTIDE  I-STAT TROPOININ, ED    Imaging Review Dg Chest 2 View  12/19/2013   CLINICAL DATA:  Midsternal chest pain and bilateral arm tingling that started today. History of lung cancer.  EXAM: CHEST  2 VIEW  COMPARISON:  CT chest 10/02/2013 and chest radiograph 08/31/2013.  FINDINGS: Trachea is midline. Surgical clips from left thyroidectomy. Heart size normal. Postoperative changes in the left hemi thorax, stable. Lungs are otherwise clear. No pleural fluid. Degenerative changes are seen in the spine.  IMPRESSION: No acute findings.   Electronically Signed   By:  Lorin Picket M.D.   On: 12/19/2013 17:54     EKG Interpretation   Date/Time:  Wednesday December 19 2013 16:31:53 EST Ventricular Rate:  66 PR Interval:  166 QRS Duration: 78 QT Interval:  394 QTC Calculation: 413 R Axis:   63 Text Interpretation:  Normal sinus rhythm Normal ECG No significant change  since last tracing Confirmed by Becky Colan  MD, Elanda Garmany ([id number]) on 12/19/2013  6:38:58 PM      MDM   Final diagnoses:  Chest pain    Patient's labs and x-rays reviewed. Symptoms consistent with chest wall pain. Do not think that this represents ACS or pulmonary embolism. Will place patient on muscle relaxants and discharged home    Leota Jacobsen, MD 12/19/13 1851

## 2013-12-19 NOTE — Discharge Instructions (Signed)

## 2013-12-19 NOTE — ED Notes (Signed)
Pt on phone trying to contact ride to come pick her up.

## 2013-12-19 NOTE — ED Notes (Signed)
Pt reports central non-radiaitng chest pressure since AM that is worse with movement. States "I feel a little lightheaded and I feel like I need to burp." Pt also reports progressively worsening SOB with exertion x 2 weeks. PT in NAD. Speaks in complete sentences.

## 2014-01-02 ENCOUNTER — Ambulatory Visit (INDEPENDENT_AMBULATORY_CARE_PROVIDER_SITE_OTHER): Payer: Commercial Managed Care - HMO | Admitting: Family Medicine

## 2014-01-02 ENCOUNTER — Encounter: Payer: Self-pay | Admitting: Family Medicine

## 2014-01-02 VITALS — BP 168/76 | HR 68 | Temp 97.9°F | Ht 66.0 in | Wt 188.4 lb

## 2014-01-02 DIAGNOSIS — Z85118 Personal history of other malignant neoplasm of bronchus and lung: Secondary | ICD-10-CM

## 2014-01-02 DIAGNOSIS — L989 Disorder of the skin and subcutaneous tissue, unspecified: Secondary | ICD-10-CM

## 2014-01-02 DIAGNOSIS — E785 Hyperlipidemia, unspecified: Secondary | ICD-10-CM

## 2014-01-02 DIAGNOSIS — Z13820 Encounter for screening for osteoporosis: Secondary | ICD-10-CM

## 2014-01-02 DIAGNOSIS — E1169 Type 2 diabetes mellitus with other specified complication: Secondary | ICD-10-CM | POA: Insufficient documentation

## 2014-01-02 DIAGNOSIS — I10 Essential (primary) hypertension: Secondary | ICD-10-CM

## 2014-01-02 DIAGNOSIS — Z23 Encounter for immunization: Secondary | ICD-10-CM

## 2014-01-02 DIAGNOSIS — E782 Mixed hyperlipidemia: Secondary | ICD-10-CM

## 2014-01-02 DIAGNOSIS — Z780 Asymptomatic menopausal state: Secondary | ICD-10-CM

## 2014-01-02 DIAGNOSIS — Z0000 Encounter for general adult medical examination without abnormal findings: Secondary | ICD-10-CM

## 2014-01-02 MED ORDER — FLUTICASONE PROPIONATE 50 MCG/ACT NA SUSP: 2.0000 | Freq: Every day | NASAL | Status: DC

## 2014-01-02 MED ORDER — ZOSTER VACCINE LIVE 19400 UNT/0.65ML ~~LOC~~ SOLR: 0.6500 mL | Freq: Once | SUBCUTANEOUS | Status: DC

## 2014-01-02 MED ORDER — HYDROCHLOROTHIAZIDE 25 MG PO TABS: 25.0000 mg | ORAL_TABLET | Freq: Every day | ORAL | Status: DC

## 2014-01-02 MED ORDER — CETIRIZINE HCL 10 MG PO TABS: 10.0000 mg | ORAL_TABLET | Freq: Every day | ORAL | Status: DC

## 2014-01-02 NOTE — Progress Notes (Signed)
   Subjective:    Patient ID: Susan Ingram, female    DOB: 1940-09-17, 73 y.o.   MRN: [id number]  HPI 73 year old female with hx of non-small cell lung cancer (now cured per recent notes), HTN, HLD, and Asthma presents for follow up.  Current concerns:  1) Skin lesion  Patient reports that she has an area of concern on her right lower leg.  She states that this "spot" has been there for ~ 2 months.   She feels that it has been enlarging and changing color.  No itching. No drainage.  No associated pain.  2) HTN  Home BP Monitoring - No  Medications - Lasix, Lisinopril.  Compliance -  Yes, but has been out of Lasix for a few days.  ROS: Denies chest pain, SOB, lightheadedness/dizziness   3) Preventative heath care Mammogram: 04/2013 (normal) Lipid screening: Last panel was in 2011. Lipid Panel     Component Value Date/Time   CHOL 141 04/09/2009 2032   TRIG 74 04/09/2009 2032   HDL 50 04/09/2009 2032   CHOLHDL 2.8 Ratio 04/09/2009 2032   VLDL 15 04/09/2009 2032   LDLCALC 76 04/09/2009 2032   In need of Dexa scan, Zostavax, Pneumovax. She states she believes she has had a tetanus booster at work.  She will let me know about this.  Regarding colonoscopy, she states that she has had only one since age 89.  41) Non-small cell carcinoma of lung  S/P Bronchoscopy, left VATS, wedge resection,lingular segmentectomy, lymph node dissection 05/23/2008.  Recently saw Dr. Roxan Hockey and had negative CT chest on 10/02/2013.  Per Dr. Roxan Hockey, she is now cured and needs no further followup w/ him.  No recent fever, chills, SOB, chest pain, weight loss.  Soc Hx:  History   Social History  . Marital Status: Divorced    Spouse Name: N/A    Number of Children: N/A  . Years of Education: N/A   Social History Main Topics  . Smoking status: Former Smoker    Quit date: 04/15/1993  . Smokeless tobacco: None  . Alcohol Use: No  . Drug Use: No  . Sexual Activity: None     Other Topics Concern  . None   Social History Narrative   Review of Systems Per HPI    Objective:   Physical Exam Filed Vitals:   01/02/14 1450  BP: 168/76  Pulse: 68  Temp: 97.9 F (36.6 C)   Exam: General: well appearing female in NAD.  Cardiovascular: RRR. No murmurs, rubs, or gallops. Respiratory: CTAB. No rales, rhonchi, or wheeze. Abdomen: soft, nontender, nondistended. Extremities: Trace LE edema. Skin: Warm, dry, intact. Small circular hypopigmented macule noted on the RLE.  Small amount of peeling noted.    Assessment & Plan:  See Problem List

## 2014-01-02 NOTE — Patient Instructions (Signed)
It was nice to see you today.  Don't worry about that area.  If it persists let me know.  Regarding your preventative care - we are setting up for a bone density scan.  I've given you a prescription for Zostavax to cover for shingles. You are getting a pneumonia shot today.  I am also getting 1 lab today to check your cholesterol   I have started HCTZ for your high blood pressure.  Do not take the lasix or potassium.  Follow up BP check in 2 weeks (nurse visit).  Follow up in 6 months to 1 year.

## 2014-01-03 DIAGNOSIS — B078 Other viral warts: Secondary | ICD-10-CM | POA: Insufficient documentation

## 2014-01-03 DIAGNOSIS — L989 Disorder of the skin and subcutaneous tissue, unspecified: Secondary | ICD-10-CM | POA: Insufficient documentation

## 2014-01-03 DIAGNOSIS — Z0000 Encounter for general adult medical examination without abnormal findings: Secondary | ICD-10-CM | POA: Insufficient documentation

## 2014-01-03 LAB — LIPID PANEL
Cholesterol: 142 mg/dL (ref 0–200)
HDL: 38 mg/dL — ABNORMAL LOW (ref >39)
LDL Cholesterol: 85 mg/dL (ref 0–99)
Total CHOL/HDL Ratio: 3.7 Ratio
Triglycerides: 96 mg/dL (ref <150)
VLDL: 19 mg/dL (ref 0–40)

## 2014-01-03 NOTE — Assessment & Plan Note (Signed)
Appears benign and looks post-traumatic. Patient reassured.  Patient to return if it worsens or fails to resolve.

## 2014-01-03 NOTE — Assessment & Plan Note (Signed)
Patient is doing well. Will continue to monitor for signs of recurrence.

## 2014-01-03 NOTE — Assessment & Plan Note (Signed)
Not at goal. Patient has been out of lasix for a few days. Will continue ACEI.  Discontinuing Lasix/Potassium; Starting HCTZ 25 mg daily. Will follow up at next visit.

## 2014-01-03 NOTE — Assessment & Plan Note (Signed)
Rx for Zostavax given today.  Pneumonia vaccine given today (PCV 13). Patient up to date on influenza vaccine.  Screening Dexa scan ordered. We discussed colon cancer screening/guidelines given her age.  We will proceed with FOBT testing and will not screen w/ colonoscopy at 74 per USPSTF guidelines. Patient to inform me of status of Tetanus.

## 2014-01-03 NOTE — Assessment & Plan Note (Signed)
Documented previously. Patient not currently on medication for this. Obtaining lipid panel today (last one was in 2011).

## 2014-01-08 ENCOUNTER — Telehealth: Payer: Self-pay | Admitting: Family Medicine

## 2014-01-08 NOTE — Telephone Encounter (Signed)
Pt called and has some question about which medications she is not to take and the ones that she is suppose to take. jw

## 2014-01-09 NOTE — Telephone Encounter (Signed)
Returned call to patient; no answer.  Will try to call patient back later today or await call back from patient.  Burna Forts, BSN, RN-BC

## 2014-04-03 ENCOUNTER — Ambulatory Visit: Payer: Commercial Managed Care - HMO | Admitting: Family Medicine

## 2014-04-09 ENCOUNTER — Ambulatory Visit (INDEPENDENT_AMBULATORY_CARE_PROVIDER_SITE_OTHER): Payer: Commercial Managed Care - HMO | Admitting: Family Medicine

## 2014-04-09 ENCOUNTER — Encounter: Payer: Self-pay | Admitting: Family Medicine

## 2014-04-09 VITALS — BP 137/69 | HR 72 | Temp 97.6°F | Ht 66.0 in | Wt 188.0 lb

## 2014-04-09 DIAGNOSIS — I10 Essential (primary) hypertension: Secondary | ICD-10-CM

## 2014-04-09 DIAGNOSIS — E785 Hyperlipidemia, unspecified: Secondary | ICD-10-CM

## 2014-04-09 DIAGNOSIS — M25561 Pain in right knee: Secondary | ICD-10-CM | POA: Insufficient documentation

## 2014-04-09 DIAGNOSIS — Z0000 Encounter for general adult medical examination without abnormal findings: Secondary | ICD-10-CM

## 2014-04-09 DIAGNOSIS — Z13820 Encounter for screening for osteoporosis: Secondary | ICD-10-CM

## 2014-04-09 MED ORDER — TRAMADOL HCL 50 MG PO TABS: 50.0000 mg | ORAL_TABLET | Freq: Three times a day (TID) | ORAL | Status: DC | PRN

## 2014-04-09 MED ORDER — ATORVASTATIN CALCIUM 40 MG PO TABS: 40.0000 mg | ORAL_TABLET | Freq: Every day | ORAL | Status: DC

## 2014-04-09 NOTE — Assessment & Plan Note (Signed)
Offered steroid injection the patient declined today. Will treat with Aspercreme, continued use of tylenol, and PRN Tramadol.

## 2014-04-09 NOTE — Assessment & Plan Note (Signed)
Last lipid panel: Lipid Panel     Component Value Date/Time   CHOL 142 01/02/2014 1537   TRIG 96 01/02/2014 1537   HDL 38* 01/02/2014 1537   CHOLHDL 3.7 01/02/2014 1537   VLDL 19 01/02/2014 1537   LDLCALC 85 01/02/2014 1537    Patient and I discussed the risks and benefits of statin therapy. After an in-depth discussion she elected to start a cholesterol medication today. Will start Lipitor 40. Patient encouraged to call if she has any side effects or concerns.

## 2014-04-09 NOTE — Assessment & Plan Note (Signed)
DEXA scan reordered today.

## 2014-04-09 NOTE — Patient Instructions (Signed)
It was nice to see you today.  Please follow-up in 2 weeks to a month for blood pressure check.  Take medications as prescribed. He can discontinue the lisinopril as of this point time, pending the recheck your blood pressure.  I prescribed a cholesterol medication for you. Please take daily.  I have prescribed something for your pain. Use as needed. You can also use the aspercreme.   We will be in contact regarding your DEXA scan.  If you have any questions please don't hesitate to call.

## 2014-04-09 NOTE — Progress Notes (Signed)
   Subjective:    Patient ID: Susan Ingram, female    DOB: 12/31/1940, 74 y.o.   MRN: [id number]  HPI 74 year old female with hx of non-small cell lung cancer, HTN, HLD, and Asthma presents for follow up.   Current issues:  1) HTN  Home BP Monitoring - No.  Medications - currently taking only HCTZ. Was previous he taken HCTZ and lisinopril but somehow mistakenly stopped the lisinopril.  Compliance -  W/ HCTZ.  ROS: Denies chest pain, SOB.   2) Right knee pain  Patient has known OA of the right knee.  Her pain is been worsening for the past month or so.  She states it is worse with physical activity and at night. Improved with Tylenol.  No recent fall, trauma, injury.  3) Preventative Health care  Patient's DEXA scan was not done (unclear why).  PMH reviewed.   Soc Hx - Former Smoker. Review of Systems Per HPI with the following additions. Patient endorses occasional dizziness.    Objective:   Physical Exam Filed Vitals:   04/09/14 1500  BP: 137/69  Pulse: 72  Temp: 97.6 F (36.4 C)   Exam: General: well appearing female in no acute distress. Cardiovascular: RRR. No murmurs, rubs, or gallops. Respiratory: CTAB. No rales, rhonchi, or wheeze. Abdomen: soft, nontender, nondistended. Extremities: No LE edema.  Knee: Right Normal to inspection with no erythema or effusion or obvious bony abnormalities. Palpation normal with no warmth, joint line tenderness, patellar tenderness, or condyle tenderness. No crepitus noted today.  Assessment & Plan:  See Problem List

## 2014-04-09 NOTE — Assessment & Plan Note (Signed)
Well-controlled at this time. Will continue HCTZ. Patient to follow-up in 2 weeks to a month for repeat blood pressure check.

## 2014-04-15 ENCOUNTER — Other Ambulatory Visit: Payer: Self-pay | Admitting: Family Medicine

## 2014-04-15 DIAGNOSIS — Z1231 Encounter for screening mammogram for malignant neoplasm of breast: Secondary | ICD-10-CM

## 2014-05-13 ENCOUNTER — Ambulatory Visit (HOSPITAL_COMMUNITY)
Admission: RE | Admit: 2014-05-13 | Discharge: 2014-05-13 | Disposition: A | Discharge status: Home / Self Care | Payer: Commercial Managed Care - HMO | Source: Ambulatory Visit | Attending: Family Medicine | Admitting: Family Medicine

## 2014-05-13 DIAGNOSIS — Z1231 Encounter for screening mammogram for malignant neoplasm of breast: Secondary | ICD-10-CM

## 2014-05-15 ENCOUNTER — Encounter: Payer: Self-pay | Admitting: Family Medicine

## 2014-06-17 ENCOUNTER — Encounter: Payer: Self-pay | Admitting: Family Medicine

## 2014-06-17 ENCOUNTER — Ambulatory Visit (INDEPENDENT_AMBULATORY_CARE_PROVIDER_SITE_OTHER): Payer: Commercial Managed Care - HMO | Admitting: Family Medicine

## 2014-06-17 VITALS — BP 134/70 | HR 75 | Temp 98.3°F | Ht 66.0 in | Wt 184.8 lb

## 2014-06-17 DIAGNOSIS — Z13820 Encounter for screening for osteoporosis: Secondary | ICD-10-CM | POA: Diagnosis not present

## 2014-06-17 DIAGNOSIS — E2839 Other primary ovarian failure: Secondary | ICD-10-CM | POA: Diagnosis not present

## 2014-06-17 DIAGNOSIS — Z1211 Encounter for screening for malignant neoplasm of colon: Secondary | ICD-10-CM | POA: Diagnosis not present

## 2014-06-17 DIAGNOSIS — M25561 Pain in right knee: Secondary | ICD-10-CM | POA: Diagnosis not present

## 2014-06-17 DIAGNOSIS — I10 Essential (primary) hypertension: Secondary | ICD-10-CM

## 2014-06-17 DIAGNOSIS — Z0000 Encounter for general adult medical examination without abnormal findings: Secondary | ICD-10-CM

## 2014-06-17 DIAGNOSIS — E785 Hyperlipidemia, unspecified: Secondary | ICD-10-CM

## 2014-06-17 DIAGNOSIS — R12 Heartburn: Secondary | ICD-10-CM

## 2014-06-17 MED ORDER — TRAMADOL HCL 50 MG PO TABS: 50.0000 mg | ORAL_TABLET | Freq: Three times a day (TID) | ORAL | Status: DC | PRN

## 2014-06-17 NOTE — Patient Instructions (Signed)
It was nice to see you today.  Take the tramadol as needed for pain.  Return the stool cards as indicated.  Follow up in 3-6 months.   Take care  Dr. Lacinda Axon

## 2014-06-18 DIAGNOSIS — R12 Heartburn: Secondary | ICD-10-CM

## 2014-06-18 HISTORY — DX: Heartburn: R12

## 2014-06-18 NOTE — Assessment & Plan Note (Signed)
Stable; continue current treatment with lisinopril and HCTZ.

## 2014-06-18 NOTE — Progress Notes (Signed)
   Subjective:    Patient ID: Susan Ingram, female    DOB: [birth year], 74 y.o.   MRN: [id number]  HPI 74 year old female with a PMH of Lung cancer (in remission), HTN & HLD presents to the clinic today with complaints of knee pain and indigestion.  1) Knee pain, right  This is a chronic issue for the patient.  She reports intermittent right knee pain.  Pain is located diffusely.  Pain is worse with physical activity.  She takes intermittent Tylenol with some relief in her pain.  Most recently she's been expressing more significant pain. She's also been experiencing associated right thigh/hip pain.  Patient has a known history of OA of the knee.   2) Indigestion  She reports recent indigestion/heartburn which began on Friday.  No known inciting factors.  She reports that she drank some Coke and belched with relief.  Patient does not have a history of GERD that she knows of.  No associated dysphagia or abdominal pain.  No recent weight loss, fevers, chills.  3) HTN  Stable, well controlled.   Home BP Monitoring - No.   Medications - HCTZ, Lisinopril.   Compliance -  Yes.   ROS: Denies chest pain, SOB, lightheadedness/dizziness   4) Preventative care  Patient is due for colonoscopy; she has never had one and declines. Will discuss today.  Patient is due for Tdap but reports that she has had one in the recent past at her job. She states that she will let me know of the date so I can update it in electronic medical record.   Patient in need of DEXA scan. This has been ordered but she never got it.  Social Hx - Former Smoker.   Review of Systems  Constitutional: Negative for fever, chills and unexpected weight change.  Gastrointestinal: Negative for nausea, vomiting and abdominal pain.  Musculoskeletal: Positive for arthralgias.      Objective:   Physical Exam Filed Vitals:   06/17/14 1441  BP: 134/70  Pulse: 75  Temp: 98.3 F (36.8 C)  Vital  signs reviewed.  Exam: General: well appearing, NAD. Cardiovascular: RRR. No murmurs, rubs, or gallops. Respiratory: CTAB. No rales, rhonchi, or wheeze. Knee: Right Normal to inspection with no erythema or effusion or obvious bony abnormalities. Palpation normal with no warmth, joint line tenderness.  Ligaments with solid consistent endpoints including ACL, PCL, LCL, MCL. Patellar glide without crepitus. Patellar and quadriceps tendons unremarkable. Abdomen: soft, nontender, nondistended.     Assessment & Plan:  See Problem List

## 2014-06-18 NOTE — Assessment & Plan Note (Signed)
DEXA scan reordered today.  Stool cards given in lieu of colonoscopy.  Patient to call regarding updated tetanus vaccine.

## 2014-06-18 NOTE — Assessment & Plan Note (Signed)
Patient with continued pain secondary to OA. Will treat with tramadol.

## 2014-06-18 NOTE — Assessment & Plan Note (Signed)
No red flags. Advised PRN Tums. Patient declined treatment with PPI/H2 blocker.

## 2014-06-18 NOTE — Assessment & Plan Note (Signed)
Stable on Lipitor.

## 2014-06-24 ENCOUNTER — Ambulatory Visit
Admission: RE | Admit: 2014-06-24 | Discharge: 2014-06-24 | Disposition: A | Discharge status: Home / Self Care | Payer: Commercial Managed Care - HMO | Source: Ambulatory Visit | Attending: Family Medicine | Admitting: Family Medicine

## 2014-06-24 ENCOUNTER — Encounter: Payer: Self-pay | Admitting: Family Medicine

## 2014-06-24 DIAGNOSIS — E2839 Other primary ovarian failure: Secondary | ICD-10-CM

## 2014-07-02 ENCOUNTER — Encounter (HOSPITAL_COMMUNITY): Payer: Self-pay | Admitting: Emergency Medicine

## 2014-07-02 ENCOUNTER — Emergency Department (INDEPENDENT_AMBULATORY_CARE_PROVIDER_SITE_OTHER)
Admission: EM | Admit: 2014-07-02 | Discharge: 2014-07-02 | Disposition: A | Discharge status: Home / Self Care | Payer: Commercial Managed Care - HMO | Source: Home / Self Care | Attending: Family Medicine | Admitting: Family Medicine

## 2014-07-02 DIAGNOSIS — R040 Epistaxis: Secondary | ICD-10-CM

## 2014-07-02 DIAGNOSIS — J3489 Other specified disorders of nose and nasal sinuses: Secondary | ICD-10-CM

## 2014-07-02 DIAGNOSIS — J340 Abscess, furuncle and carbuncle of nose: Secondary | ICD-10-CM

## 2014-07-02 LAB — POCT I-STAT, CHEM 8
BUN: 18 mg/dL (ref 6–20)
Calcium, Ion: 1.2 mmol/L (ref 1.13–1.30)
Chloride: 98 mmol/L — ABNORMAL LOW (ref 101–111)
Creatinine, Ser: 0.8 mg/dL (ref 0.44–1.00)
Glucose, Bld: 109 mg/dL — ABNORMAL HIGH (ref 65–99)
HCT: 43 % (ref 36.0–46.0)
Hemoglobin: 14.6 g/dL (ref 12.0–15.0)
Potassium: 3.7 mmol/L (ref 3.5–5.1)
Sodium: 139 mmol/L (ref 135–145)
TCO2: 26 mmol/L (ref 0–100)

## 2014-07-02 MED ORDER — MUPIROCIN 2 % EX OINT: 1.0000 "application " | TOPICAL_OINTMENT | Freq: Two times a day (BID) | CUTANEOUS | Status: DC

## 2014-07-02 MED ORDER — OXYMETAZOLINE HCL 0.05 % NA SOLN: 1.0000 | Freq: Two times a day (BID) | NASAL | Status: DC

## 2014-07-02 NOTE — ED Provider Notes (Signed)
CSN: [id number]     Arrival date & time 07/02/14  1544 History   First MD Initiated Contact with Patient 07/02/14 1659     Chief Complaint  Patient presents with  . Epistaxis   (Consider location/radiation/quality/duration/timing/severity/associated sxs/prior Treatment) HPI Comments: Endorses both recent cough/cold symptoms as well as chronic mild environmental allergy symptoms. Uses Flonase and Zyrtec. Does not take prescription anticoagulants, but does take daily low dose aspirin.   Patient is a 74 y.o. female presenting with nosebleeds. The history is provided by the patient.  Epistaxis Location:  R nare Severity:  Mild Timing:  Intermittent Progression since onset: reports she has had a couple of episodes of epistaxis from right nare since 06/26/2014. Relieved by:  Applying pressure Associated symptoms: congestion     Past Medical History  Diagnosis Date  . Cancer     lung  . Hypertension   . Hypercholesterolemia   . Eczema   . Asthma    Past Surgical History  Procedure Laterality Date  . Bronchoscopy, left video-assisted thoracoscopy, wedge  05/23/2008  . Left thyroid lobectomy, frozen section.  10/11/2002  . Vaginal hysterectomy     Family History  Problem Relation Age of Onset  . Alzheimer's disease Father   . Early death Mother   . Hypertension Mother   . Stroke Mother    History  Substance Use Topics  . Smoking status: Former Smoker    Quit date: 04/15/1993  . Smokeless tobacco: Not on file  . Alcohol Use: No   OB History    No data available     Review of Systems  Constitutional: Negative.   HENT: Positive for congestion, nosebleeds and rhinorrhea.   Respiratory: Negative.   Cardiovascular: Negative.     Allergies  Amlodipine; Maxzide; and Toprol xl  Home Medications   Prior to Admission medications   Medication Sig Start Date End Date Taking? Authorizing Provider  atorvastatin (LIPITOR) 40 MG tablet Take 1 tablet (40 mg total) by mouth  daily. 04/09/14  Yes Coral Spikes, DO  hydrochlorothiazide (HYDRODIURIL) 25 MG tablet Take 1 tablet (25 mg total) by mouth daily. 01/02/14  Yes Coral Spikes, DO  lisinopril (PRINIVIL,ZESTRIL) 20 MG tablet Take 1 tablet (20 mg total) by mouth daily. 11/22/13  Yes Coral Spikes, DO  acetaminophen (TYLENOL) 500 MG tablet Take 1,000 mg by mouth every 6 (six) hours as needed for mild pain.     Historical Provider, MD  albuterol (PROVENTIL HFA;VENTOLIN HFA) 108 (90 BASE) MCG/ACT inhaler Inhale 2 puffs into the lungs every 6 (six) hours as needed for wheezing or shortness of breath. 06/07/13   Coral Spikes, DO  calcium-vitamin D 250-100 MG-UNIT per tablet Take 1 tablet by mouth 2 (two) times daily.      Historical Provider, MD  cetirizine (ZYRTEC) 10 MG tablet Take 1 tablet (10 mg total) by mouth daily. 01/02/14   Coral Spikes, DO  fluticasone (FLONASE) 50 MCG/ACT nasal spray Place 2 sprays into both nostrils daily. 01/02/14   Coral Spikes, DO  mupirocin ointment (BACTROBAN) 2 % Place 1 application into the nose 2 (two) times daily. X 7 days 07/02/14   Lutricia Feil, PA  oxymetazoline (AFRIN NASAL SPRAY) 0.05 % nasal spray Place 1 spray into right nostril 2 (two) times daily. X 3 days only 07/02/14   Audelia Hives Sheridyn Canino, PA  traMADol (ULTRAM) 50 MG tablet Take 1 tablet (50 mg total) by mouth every 8 (eight) hours  as needed. 06/17/14   Jayce G Cook, DO   BP 145/84 mmHg  Pulse 62  Temp(Src) 97.8 F (36.6 C) (Oral)  Resp 18  SpO2 98% Physical Exam  Constitutional: She is oriented to person, place, and time. She appears well-developed and well-nourished.  HENT:  Head: Normocephalic and atraumatic.  Right Ear: Hearing and external ear normal.  Left Ear: Hearing and external ear normal.  Nose: No epistaxis.  Mouth/Throat: Uvula is midline, oropharynx is clear and moist and mucous membranes are normal.  Small shallow ulcer with visible cluster of capillaries at anterior nasal septum. No active  bleeding.   Eyes: Conjunctivae are normal.  Neck: Normal range of motion. Neck supple.  Cardiovascular: Normal rate.   Pulmonary/Chest: Effort normal.  Musculoskeletal: Normal range of motion.  Neurological: She is alert and oriented to person, place, and time.  Skin: Skin is warm and dry.  Psychiatric: She has a normal mood and affect. Her behavior is normal.  Nursing note and vitals reviewed.   ED Course  Procedures (including critical care time) Labs Review Labs Reviewed  POCT I-STAT, CHEM 8 - Abnormal; Notable for the following:    Chloride 98 (*)    Glucose, Bld 109 (*)    All other components within normal limits    Imaging Review No results found.   MDM   1. Anterior epistaxis   2. Nasal septal ulcer   H/H normal Afrin x 3 days Bactroban BID x 5 days Hold aspirin x 3 days Follow up with ENT Benjamine Mola) or PCP if episodes continue.     Lutricia Feil, [place] 07/02/14 [phone]

## 2014-07-02 NOTE — Discharge Instructions (Signed)
Please hold (do not take) you aspirin for 3 days. Then you may resume your usual dosing schedule.  Nosebleed Nosebleeds can be caused by many conditions, including trauma, infections, polyps, foreign bodies, dry mucous membranes or climate, medicines, and air conditioning. Most nosebleeds occur in the front of the nose. Because of this location, most nosebleeds can be controlled by pinching the nostrils gently and continuously for at least 10 to 20 minutes. The long, continuous pressure allows enough time for the blood to clot. If pressure is released during that 10 to 20 minute time period, the process may have to be started again. The nosebleed may stop by itself or quit with pressure, or it may need concentrated heating (cautery) or pressure from packing. HOME CARE INSTRUCTIONS   If your nose was packed, try to maintain the pack inside until your health care provider removes it. If a gauze pack was used and it starts to fall out, gently replace it or cut the end off. Do not cut if a balloon catheter was used to pack the nose. Otherwise, do not remove unless instructed.  Avoid blowing your nose for 12 hours after treatment. This could dislodge the pack or clot and start the bleeding again.  If the bleeding starts again, sit up and bend forward, gently pinching the front half of your nose continuously for 20 minutes.  If bleeding was caused by dry mucous membranes, use over-the-counter saline nasal spray or gel. This will keep the mucous membranes moist and allow them to heal. If you must use a lubricant, choose the water-soluble variety. Use it only sparingly and not within several hours of lying down.  Do not use petroleum jelly or mineral oil, as these may drip into the lungs and cause serious problems.  Maintain humidity in your home by using less air conditioning or by using a humidifier.  Do not use aspirin or medicines which make bleeding more likely. Your health care provider can give you  recommendations on this.  Resume normal activities as you are able, but try to avoid straining, lifting, or bending at the waist for several days.  If the nosebleeds become recurrent and the cause is unknown, your health care provider may suggest laboratory tests. SEEK MEDICAL CARE IF: You have a fever. SEEK IMMEDIATE MEDICAL CARE IF:   Bleeding recurs and cannot be controlled.  There is unusual bleeding from or bruising on other parts of the body.  Nosebleeds continue.  There is any worsening of the condition which originally brought you in.  You become light-headed, feel faint, become sweaty, or vomit blood. MAKE SURE YOU:   Understand these instructions.  Will watch your condition.  Will get help right away if you are not doing well or get worse. Document Released: 11/11/2004 Document Revised: 06/18/2013 Document Reviewed: 01/02/2009 [facility] Patient Information 2015 [place], [place]. This information is not intended to replace advice given to you by your health care provider. Make sure you discuss any questions you have with your health care provider.

## 2014-07-02 NOTE — ED Notes (Signed)
C/o intermittent nose bleeds onset 5/11; will last for 10 minutes Has had x2 nose bleeds today; sx also include feeling dizzy Denies fevers, chills, n/v, HA Alert, no signs of acute distress.

## 2014-07-04 LAB — POC HEMOCCULT BLD/STL (HOME/3-CARD/SCREEN)
Card #2 Fecal Occult Blod, POC: POSITIVE
Card #3 Fecal Occult Blood, POC: NEGATIVE
Fecal Occult Blood, POC: NEGATIVE

## 2014-07-04 NOTE — Addendum Note (Signed)
Addended by: [place] Pink on: 07/04/2014 08:50 AM   Modules accepted: Orders

## 2014-07-08 ENCOUNTER — Telehealth: Payer: Self-pay | Admitting: *Deleted

## 2014-07-08 NOTE — Telephone Encounter (Signed)
Tried to call patient but there was no answer or VM.  Please make an appt for her to pcp to discuss labs. Jazmin Hartsell,CMA

## 2014-07-08 NOTE — Telephone Encounter (Signed)
-----   Message from Coral Spikes, DO sent at 07/08/2014  1:21 PM EDT ----- Please have patient follow up with me to discuss stool card results.   [place] PGY-3 Pager #: [phone]

## 2014-07-09 ENCOUNTER — Encounter: Payer: Self-pay | Admitting: Family Medicine

## 2014-07-09 ENCOUNTER — Telehealth: Payer: Self-pay | Admitting: Family Medicine

## 2014-07-09 NOTE — Telephone Encounter (Signed)
Called patient no answer, no voicemail. Patient needs to schedule an appt with me.

## 2014-07-18 ENCOUNTER — Ambulatory Visit: Payer: Commercial Managed Care - HMO | Admitting: Family Medicine

## 2014-07-23 ENCOUNTER — Encounter: Payer: Self-pay | Admitting: Family Medicine

## 2014-07-23 ENCOUNTER — Ambulatory Visit (INDEPENDENT_AMBULATORY_CARE_PROVIDER_SITE_OTHER): Payer: Commercial Managed Care - HMO | Admitting: Family Medicine

## 2014-07-23 VITALS — BP 130/69 | HR 64 | Temp 97.6°F | Ht 66.0 in | Wt 187.7 lb

## 2014-07-23 DIAGNOSIS — R195 Other fecal abnormalities: Secondary | ICD-10-CM | POA: Diagnosis not present

## 2014-07-23 NOTE — Patient Instructions (Signed)
It was nice to see you today.  Be sure to get your colonoscopy.  Take care  Dr. Lacinda Axon  Follow up in ~ 6 months.

## 2014-07-24 DIAGNOSIS — R195 Other fecal abnormalities: Secondary | ICD-10-CM | POA: Insufficient documentation

## 2014-07-24 NOTE — Progress Notes (Signed)
   Subjective:    Patient ID: Susan Ingram, female    DOB: 18-Oct-1940, 74 y.o.   MRN: [id number]  HPI 74 year old female with a history of lung cancer, hypertension, and hyperlipidemia presents for follow-up regarding recent Hemoccult-positive stool (from stool cards as she declined colonoscopy).  Patient presents today after being informed of Hemoccult-positive stool.  Patient states that she is doing well otherwise. She is concerned about this finding and is anxious about this. No recent nausea vomiting. No reported abdominal pain. No blood noted in the stool. No dark stool. No chest pain or shortness of breath.  Review of Systems Per HPI    Objective:   Physical Exam  Filed Vitals:   07/23/14 1557  BP: 130/69  Pulse: 64  Temp: 97.6 F (36.4 C)   Vital signs reviewed.  Exam: General: well appearing elderly female in no acute distress. HEENT: NCAT. No scleral icterus. Cardiovascular: RRR. No murmurs, rubs, or gallops. Respiratory: CTAB. No rales, rhonchi, or wheeze. Abdomen: soft, nontender, nondistended. No masses. No palpable organomegaly. No rebound or guarding. Rectal: Patient declined.     Assessment & Plan:  See Problem List

## 2014-07-24 NOTE — Assessment & Plan Note (Signed)
I had previously discussed need for repeat screening colonoscopy and patient declined. Patient agreed to stool cards and one of the 3 came back positive. Patient and I discussed obtaining colonoscopy and patient is in agreement. Referral to [facility] placed and patient has an appointment later in June. Patient declined rectal exam today so cannot determine whether there is a component of internal/external hemorrhoids.  She denies history of both of these.

## 2014-08-02 ENCOUNTER — Encounter: Payer: Self-pay | Admitting: Family Medicine

## 2014-08-05 ENCOUNTER — Telehealth: Payer: Self-pay | Admitting: *Deleted

## 2014-08-05 NOTE — Telephone Encounter (Signed)
-----   Message from Coral Spikes, DO sent at 08/05/2014  8:29 AM EDT ----- Please inform of normal dexa.

## 2014-08-05 NOTE — Telephone Encounter (Signed)
attempted to call.  No answer and no machine.  Will mail letter with normal results. Jode Lippe, Salome Spotted

## 2014-08-07 ENCOUNTER — Encounter: Payer: Self-pay | Admitting: Gastroenterology

## 2014-08-14 ENCOUNTER — Ambulatory Visit (INDEPENDENT_AMBULATORY_CARE_PROVIDER_SITE_OTHER): Payer: Commercial Managed Care - HMO | Admitting: Gastroenterology

## 2014-08-14 ENCOUNTER — Encounter: Payer: Self-pay | Admitting: Gastroenterology

## 2014-08-14 VITALS — BP 132/82 | HR 78 | Ht 66.0 in | Wt 182.6 lb

## 2014-08-14 DIAGNOSIS — R1319 Other dysphagia: Secondary | ICD-10-CM

## 2014-08-14 DIAGNOSIS — R195 Other fecal abnormalities: Secondary | ICD-10-CM | POA: Diagnosis not present

## 2014-08-14 NOTE — Progress Notes (Signed)
08/14/2014 Susan Ingram [id number] 1940/07/22   HISTORY OF PRESENT ILLNESS:  This is a pleasant 74 year old female who is previously known to Dr. Carlean Purl in 08/2005 for colonoscopy.  At that time she was found to have only mild diverticulosis.  She is here today at the request of her PCP, Dr. Lacinda Axon, for evaluation regarding heme positive stools.  She had 1 out of 3 positive hemoccult cards.  She admits to seeing rare bright red blood in her stool with a a bowel movement.  Denies any other issues with her bowels.  No FH of colon cancer.  Hgb is normal at 13 grams.  Will she is here she also admits to occasional sensation that food gets stuck or hung up.  She says that she will take a drink of soda and burp, which then relieves the sensation.  No heartburn/reflux.  No dysphagia to liquids or choking issues.   Past Medical History  Diagnosis Date  . Cancer     lung  . Hypertension   . Hypercholesterolemia   . Eczema   . Asthma    Past Surgical History  Procedure Laterality Date  . Bronchoscopy, left video-assisted thoracoscopy, wedge  05/23/2008  . Left thyroid lobectomy, frozen section.  10/11/2002  . Vaginal hysterectomy      reports that she quit smoking about 21 years ago. She does not have any smokeless tobacco history on file. She reports that she does not drink alcohol or use illicit drugs. family history includes Alzheimer's disease in her father; Early death in her mother; Hypertension in her mother; Stroke in her mother. Allergies  Allergen Reactions  . Amlodipine Swelling    LEG AND ANKLE  . Maxzide [Triamterene-Hctz]     Unsure of reaction  . Toprol Xl [Metoprolol Tartrate]       Outpatient Encounter Prescriptions as of 08/14/2014  Medication Sig  . acetaminophen (TYLENOL) 500 MG tablet Take 1,000 mg by mouth every 6 (six) hours as needed for mild pain.   Marland Kitchen albuterol (PROVENTIL HFA;VENTOLIN HFA) 108 (90 BASE) MCG/ACT inhaler Inhale 2 puffs into the lungs every  6 (six) hours as needed for wheezing or shortness of breath.  Marland Kitchen atorvastatin (LIPITOR) 40 MG tablet Take 1 tablet (40 mg total) by mouth daily.  . calcium-vitamin D 250-100 MG-UNIT per tablet Take 1 tablet by mouth 2 (two) times daily.    . cetirizine (ZYRTEC) 10 MG tablet Take 1 tablet (10 mg total) by mouth daily.  . fluticasone (FLONASE) 50 MCG/ACT nasal spray Place 2 sprays into both nostrils daily.  . hydrochlorothiazide (HYDRODIURIL) 25 MG tablet Take 1 tablet (25 mg total) by mouth daily.  Marland Kitchen lisinopril (PRINIVIL,ZESTRIL) 20 MG tablet Take 1 tablet (20 mg total) by mouth daily.  . mupirocin ointment (BACTROBAN) 2 % Place 1 application into the nose 2 (two) times daily. X 7 days  . oxymetazoline (AFRIN NASAL SPRAY) 0.05 % nasal spray Place 1 spray into right nostril 2 (two) times daily. X 3 days only  . traMADol (ULTRAM) 50 MG tablet Take 1 tablet (50 mg total) by mouth every 8 (eight) hours as needed.   No facility-administered encounter medications on file as of 08/14/2014.     REVIEW OF SYSTEMS  : All other systems reviewed and negative except where noted in the History of Present Illness.   PHYSICAL EXAM: BP 132/82 mmHg  Pulse 78  Ht '5\' 6"'$  (1.676 m)  Wt 182 lb 9.6 oz (82.827  kg)  BMI 29.49 kg/m2 General: Well developed black female in no acute distress Head: Normocephalic and atraumatic Eyes:  Sclerae anicteric, conjunctiva pink. Ears: Normal auditory acuity Lungs: Clear throughout to auscultation Heart: Regular rate and rhythm Abdomen: Soft, non-distended.  Normal bowel sounds.  Non-tender. Rectal:  Will be done at the time of colonoscopy. Musculoskeletal: Symmetrical with no gross deformities  Skin: No lesions on visible extremities Extremities: No edema  Neurological: Alert oriented x 4, grossly non-focal Psychological:  Alert and cooperative. Normal mood and affect  ASSESSMENT AND PLAN: -Heme positive:  1 out of 3 positive.  Normal Hgb.  Sees rare bright red blood  with BM's.  Last colonoscopy 08/2005. -Dysphagia:  Intermittent to solid foods.  *Will schedule for EGD with possible dilation and colonoscopy to evaluate both the heme positive stools and the dysphagia with Dr. Carlean Purl.  The risks, benefits, and alternatives were discussed with the patient and she consents to proceed.    CC:  Coral Spikes, DO

## 2014-08-14 NOTE — Patient Instructions (Addendum)
You have been scheduled for an endoscopy and colonoscopy. Please follow the written instructions given to you at your visit today. Please pick up your prep supplies at the pharmacy within the next 1-3 days. If you use inhalers (even only as needed), please bring them with you on the day of your procedure. Susan Ingram

## 2014-08-14 NOTE — Progress Notes (Signed)
Agree with Ms. Zehr's management.  Carl E. Gessner, MD, FACG  

## 2014-09-06 ENCOUNTER — Ambulatory Visit (INDEPENDENT_AMBULATORY_CARE_PROVIDER_SITE_OTHER): Payer: Commercial Managed Care - HMO | Admitting: Internal Medicine

## 2014-09-06 ENCOUNTER — Encounter: Payer: Self-pay | Admitting: Internal Medicine

## 2014-09-06 VITALS — BP 122/75 | HR 75 | Temp 98.3°F | Ht 66.0 in | Wt 184.0 lb

## 2014-09-06 DIAGNOSIS — M25561 Pain in right knee: Secondary | ICD-10-CM | POA: Diagnosis not present

## 2014-09-06 DIAGNOSIS — G8929 Other chronic pain: Secondary | ICD-10-CM

## 2014-09-06 MED ORDER — METHYLPREDNISOLONE ACETATE 40 MG/ML IJ SUSP
40.0000 mg | Freq: Once | INTRAMUSCULAR | Status: AC
  Administered 2014-09-06: 40 mg via INTRA_ARTICULAR

## 2014-09-06 NOTE — Progress Notes (Signed)
Subjective:    Patient ID: Susan Ingram, female    DOB: Jan 28, 1941, 74 y.o.   MRN: [id number]  HPI Pt with hx of HTN, hyperlipidemia, and OA.   CC: Chronic right knee pain  -pain increasing in severity -worse at night -pain located anterior medial to patella  -uses knee brace daily and takes extra strength tylenol  -last steroid injection in Feb 2016  -no recent fall or trauma  ROS:  Gen: afebrile MSK: occasional pain in right hip/thigh  GI: no N/V/D     Past medical history, surgical, family, and social history reviewed and updated in the EMR as appropriate. Objective:  BP 122/75 mmHg  Pulse 75  Temp(Src) 98.3 F (36.8 C) (Oral)  Ht '5\' 6"'$  (1.676 m)  Wt 184 lb (83.462 kg)  BMI 29.71 kg/m2 Vitals and nursing note reviewed  Gen: NAD  MSK: No bony deformity or tenderness with palpation to R knee. Mild edema of right knee compared to left knee. ROM and sensation intact. Crepitus noted with passive ROM.  Some instability on right side while walking. Strength 5/5. Anterior drawer, posterior drawer tests, Lachman's test, and valgus/varus stress tests were negative. Mild medial and lateral meniscal instability with McMurray test. No warmth or erythema noted.      Assessment & Plan:  See Problem List Documentation  Consent obtained and verified. Sterile betadine prep. Furthur cleansed with alcohol. Topical analgesic spray: Ethyl chloride. Joint: right knee  Approached in typical fashion with: anterolateral approach  Completed without difficulty Meds: 1cc DepoMedrol, 4cc Lidocaine 1% Needle: 25 gauge  Aftercare instructions and Red flags advised.

## 2014-09-06 NOTE — Assessment & Plan Note (Signed)
Steroid injection performed. Encouraged continued use of knee brace and tylenol prn.

## 2014-09-06 NOTE — Patient Instructions (Signed)
It was great seeing you today!   1. You can get another knee injection in 4 months if needed. 2. Keep taking the extra strength tylenol and using your knee brace.    Please bring all your medications to every doctors visit  Sign up for My Chart to have easy access to your labs results, and communication with your Primary care physician.  Next Appointment  Please call to make an appointment with Dr. Juleen China in 4-6 months.    I look forward to talking with you again at our next visit. If you have any questions or concerns before then, please call the clinic at [phone].  Take Care,   Dr. Phill Myron

## 2014-10-01 ENCOUNTER — Ambulatory Visit (AMBULATORY_SURGERY_CENTER): Payer: Commercial Managed Care - HMO | Admitting: Internal Medicine

## 2014-10-01 ENCOUNTER — Encounter: Payer: Self-pay | Admitting: Internal Medicine

## 2014-10-01 VITALS — BP 152/77 | HR 62 | Temp 98.3°F | Resp 26 | Ht 66.0 in | Wt 182.0 lb

## 2014-10-01 DIAGNOSIS — K2970 Gastritis, unspecified, without bleeding: Secondary | ICD-10-CM

## 2014-10-01 DIAGNOSIS — B9681 Helicobacter pylori [H. pylori] as the cause of diseases classified elsewhere: Secondary | ICD-10-CM

## 2014-10-01 DIAGNOSIS — R1319 Other dysphagia: Secondary | ICD-10-CM

## 2014-10-01 DIAGNOSIS — K317 Polyp of stomach and duodenum: Secondary | ICD-10-CM

## 2014-10-01 DIAGNOSIS — R195 Other fecal abnormalities: Secondary | ICD-10-CM | POA: Diagnosis not present

## 2014-10-01 MED ORDER — SODIUM CHLORIDE 0.9 % IV SOLN: 500.0000 mL | INTRAVENOUS | Status: DC

## 2014-10-01 NOTE — Op Note (Signed)
[place]  [address] [place], [address]   COLONOSCOPY PROCEDURE REPORT  PATIENT: Susan Ingram, Susan Ingram  MR#: [id number] BIRTHDATE: [birth year] , 6  yrs. old GENDER: female ENDOSCOPIST: Gatha Mayer, MD, [facility] PROCEDURE DATE:  10/01/2014 PROCEDURE:   Colonoscopy, diagnostic First Screening Colonoscopy - Avg.  risk and is 50 yrs.  old or older - No.  Prior Negative Screening - Now for repeat screening. N/A  History of Adenoma - Now for follow-up colonoscopy & has been > or = to 3 yrs.  N/A  Polyps removed today? No Recommend repeat exam, <10 yrs? No ASA CLASS:   Class II INDICATIONS:Evaluation of unexplained GI bleeding heme + stool and Patient is not applicable for Colorectal Neoplasm Risk Assessment for this procedure. MEDICATIONS: Propofol 150 mg IV, Monitored anesthesia care, and Residual sedation present  DESCRIPTION OF PROCEDURE:   After the risks benefits and alternatives of the procedure were thoroughly explained, informed consent was obtained.  The digital rectal exam revealed no abnormalities of the rectum.   The LB [id number]  endoscope was introduced through the anus and advanced to the cecum, which was identified by both the appendix and ileocecal valve. No adverse events experienced.   The quality of the prep was excellent.  The instrument was then slowly withdrawn as the colon was fully examined. Estimated blood loss is zero unless otherwise noted in this procedure report.      COLON FINDINGS: A normal appearing cecum, ileocecal valve, and appendiceal orifice were identified.  The ascending, transverse, descending, sigmoid colon, and rectum appeared unremarkable. Retroflexed views revealed no abnormalities. The time to cecum = 2.9 Withdrawal time = 8.9   The scope was withdrawn and the procedure completed. COMPLICATIONS: There were no immediate complications.  ENDOSCOPIC IMPRESSION: Normal colonoscopy - excellent  prep  RECOMMENDATIONS: Follow-up prn  eSigned:  Gatha Mayer, MD, [facility] 10/01/2014 3:39 PM   cc: Dr. Phill Myron and The Patient

## 2014-10-01 NOTE — Progress Notes (Signed)
Transferred to recovery room. A/O x3, pleased with MAC.  VSS.  Report to Jane, RN. 

## 2014-10-01 NOTE — Patient Instructions (Addendum)
I found some stomach polyps and took biopsies but do not think they are any problem.  No signs of cancer.  I appreciate the opportunity to care for you. Gatha Mayer, MD, FACG  YOU HAD AN ENDOSCOPIC PROCEDURE TODAY AT [facility]:   Refer to the procedure report that was given to you for any specific questions about what was found during the examination.  If the procedure report does not answer your questions, please call your gastroenterologist to clarify.  If you requested that your care partner not be given the details of your procedure findings, then the procedure report has been included in a sealed envelope for you to review at your convenience later.  YOU SHOULD EXPECT: Some feelings of bloating in the abdomen. Passage of more gas than usual.  Walking can help get rid of the air that was put into your GI tract during the procedure and reduce the bloating. If you had a lower endoscopy (such as a colonoscopy or flexible sigmoidoscopy) you may notice spotting of blood in your stool or on the toilet paper. If you underwent a bowel prep for your procedure, you may not have a normal bowel movement for a few days.  Please Note:  You might notice some irritation and congestion in your nose or some drainage.  This is from the oxygen used during your procedure.  There is no need for concern and it should clear up in a day or so.  SYMPTOMS TO REPORT IMMEDIATELY:   Following lower endoscopy (colonoscopy or flexible sigmoidoscopy):  Excessive amounts of blood in the stool  Significant tenderness or worsening of abdominal pains  Swelling of the abdomen that is new, acute  Fever of 100F or higher   Following upper endoscopy (EGD)  Vomiting of blood or coffee ground material  New chest pain or pain under the shoulder blades  Painful or persistently difficult swallowing  New shortness of breath  Fever of 100F or higher  Black, tarry-looking stools  For urgent or  emergent issues, a gastroenterologist can be reached at any hour by calling [phone].   DIET: Your first meal following the procedure should be a small meal and then it is ok to progress to your normal diet. Heavy or fried foods are harder to digest and may make you feel nauseous or bloated.  Likewise, meals heavy in dairy and vegetables can increase bloating.  Drink plenty of fluids but you should avoid alcoholic beverages for 24 hours.  ACTIVITY:  You should plan to take it easy for the rest of today and you should NOT DRIVE or use heavy machinery until tomorrow (because of the sedation medicines used during the test).    FOLLOW UP: Our staff will call the number listed on your records the next business day following your procedure to check on you and address any questions or concerns that you may have regarding the information given to you following your procedure. If we do not reach you, we will leave a message.  However, if you are feeling well and you are not experiencing any problems, there is no need to return our call.  We will assume that you have returned to your regular daily activities without incident.  If any biopsies were taken you will be contacted by phone or by letter within the next 1-3 weeks.  Please call us at [phone] if you have not heard about the biopsies in 3 weeks.    SIGNATURES/CONFIDENTIALITY:  You and/or your care partner have signed paperwork which will be entered into your electronic medical record.  These signatures attest to the fact that that the information above on your After Visit Summary has been reviewed and is understood.  Full responsibility of the confidentiality of this discharge information lies with you and/or your care-partner.

## 2014-10-01 NOTE — Op Note (Signed)
[place]  [address]. [place], [address]   ENDOSCOPY PROCEDURE REPORT  PATIENT: Susan, Ingram  MR#: [id number] BIRTHDATE: [birth year] , 65  yrs. old GENDER: female ENDOSCOPIST: Gatha Mayer, MD, [facility] PROCEDURE DATE:  10/01/2014 PROCEDURE:  EGD w/ biopsy ASA CLASS:     Class II INDICATIONS:  dysphagia and heme+. MEDICATIONS: Propofol 150 mg IV and Monitored anesthesia care TOPICAL ANESTHETIC: none  DESCRIPTION OF PROCEDURE: After the risks benefits and alternatives of the procedure were thoroughly explained, informed consent was obtained.  The LB [id number] endoscope was introduced through the mouth and advanced to the second portion of the duodenum , Without limitations.  The instrument was slowly withdrawn as the mucosa was fully examined.    1) A few diminutive sessile gastric body polyps - biopsied. 2) Otherwise normal EGD.  Retroflexed views revealed no abnormalities. The scope was then withdrawn from the patient and the procedure completed.  COMPLICATIONS: There were no immediate complications.  ENDOSCOPIC IMPRESSION: 1) A few diminutive sessile gastric body polyps - biopsied. 2) Otherwise normal EGD  RECOMMENDATIONS: 1.  Await biopsy results 2.  Proceed with a Colonoscopy.  eSigned:  Gatha Mayer, MD, [facility] 10/01/2014 3:36 PM    CC: Dr. Phill Myron, The Patient

## 2014-10-01 NOTE — Progress Notes (Signed)
Called to room to assist during endoscopic procedure.  Patient ID and intended procedure confirmed with present staff. Received instructions for my participation in the procedure from the performing physician.  

## 2014-10-02 ENCOUNTER — Telehealth: Payer: Self-pay | Admitting: *Deleted

## 2014-10-02 NOTE — Telephone Encounter (Signed)
  Follow up Call-  Call back number 10/01/2014  Post procedure Call Back phone  # [phone]  Permission to leave phone message No  comments no voicemail     Patient questions:  Do you have a fever, pain , or abdominal swelling? No. Pain Score  0 *  Have you tolerated food without any problems? Yes.    Have you been able to return to your normal activities? Yes.    Do you have any questions about your discharge instructions: Diet   No. Medications  No. Follow up visit  No.  Do you have questions or concerns about your Care? No.  Actions: * If pain score is 4 or above: No action needed, pain <4.

## 2014-10-03 ENCOUNTER — Ambulatory Visit (INDEPENDENT_AMBULATORY_CARE_PROVIDER_SITE_OTHER): Payer: Medicare HMO | Admitting: Family Medicine

## 2014-10-03 ENCOUNTER — Encounter: Payer: Self-pay | Admitting: Family Medicine

## 2014-10-03 VITALS — BP 145/65 | HR 55 | Temp 97.6°F | Ht 66.0 in | Wt 181.3 lb

## 2014-10-03 DIAGNOSIS — H9202 Otalgia, left ear: Secondary | ICD-10-CM | POA: Diagnosis not present

## 2014-10-03 DIAGNOSIS — H6122 Impacted cerumen, left ear: Secondary | ICD-10-CM

## 2014-10-03 NOTE — Patient Instructions (Signed)
Thank you for coming to see me today. It was a pleasure. Today we talked about:   Ear discomfort: this may be related to a tube that is in your ear that can sometimes get inflamed. It seems clearing the wax out of your ear might have helped as well. If your symptoms persist, I will refer you to an [facility] doctor.  Please follow-up with Dr. Juleen China.  If you have any questions or concerns, please do not hesitate to call the office at [phone].  Sincerely,  Cordelia Poche, MD

## 2014-10-03 NOTE — Progress Notes (Signed)
    Subjective   Susan Ingram is a 74 y.o. female that presents for a same day visit  1. Ear pain, left: Symptoms started two weeks ago. Pain described as intermittent and a feeling of ear closing and decreased hearing. She sometimes hears popping. Nothing elicits the symptoms and nothing alleviates the symptoms. She has tried using a q-tip in her ear which did not help. She has recently been sneezing and experiencing rhinorrhea. Symptoms occuring about 3-4 times per week. She is a former smoker, quit 1995.   ROS Per HPI  Social History  Substance Use Topics  . Smoking status: Former Smoker    Quit date: 04/15/1993  . Smokeless tobacco: None  . Alcohol Use: No    Allergies  Allergen Reactions  . Amlodipine Swelling    LEG AND ANKLE  . Maxzide [Triamterene-Hctz]     Unsure of reaction  . Toprol Xl [Metoprolol Tartrate]     Objective   BP 145/65 mmHg  Pulse 55  Temp(Src) 97.6 F (36.4 C) (Oral)  Ht '5\' 6"'$  (1.676 m)  Wt 181 lb 4.8 oz (82.237 kg)  BMI 29.28 kg/m2  General: Well appearing, no distress HEENT: Bilateral TMs normal. A lot of wax was removed from left ear with curette. Nasal turbinates large, oropharynx clear, no oral lesions  Assessment and Plan   No orders of the defined types were placed in this encounter.    Ear discomfort: appears to be improved with cerumen evacuation. Possible eustachian tube defect. Does not appear alarming since this is not painful per patient. Hearing appears to be normal from patient report unless she has symptoms.  Follow-up recurrence of symptoms  Suggested mineral oil for ear wax  Follow-up with ENT most likely if symptoms return

## 2014-10-08 ENCOUNTER — Other Ambulatory Visit: Payer: Self-pay

## 2014-10-08 ENCOUNTER — Encounter: Payer: Self-pay | Admitting: Internal Medicine

## 2014-10-08 DIAGNOSIS — K2970 Gastritis, unspecified, without bleeding: Secondary | ICD-10-CM

## 2014-10-08 DIAGNOSIS — B9681 Helicobacter pylori [H. pylori] as the cause of diseases classified elsewhere: Secondary | ICD-10-CM

## 2014-10-08 HISTORY — DX: Helicobacter pylori (H. pylori) as the cause of diseases classified elsewhere: B96.81

## 2014-10-08 MED ORDER — CLARITHROMYCIN 500 MG PO TABS: 500.0000 mg | ORAL_TABLET | Freq: Two times a day (BID) | ORAL | Status: DC

## 2014-10-08 MED ORDER — OMEPRAZOLE 20 MG PO CPDR: 20.0000 mg | DELAYED_RELEASE_CAPSULE | Freq: Every day | ORAL | Status: DC

## 2014-10-08 MED ORDER — AMOXICILLIN 500 MG PO CAPS: 1000.0000 mg | ORAL_CAPSULE | Freq: Two times a day (BID) | ORAL | Status: DC

## 2014-10-08 NOTE — Progress Notes (Signed)
Quick Note:  Let her know she has stomach infection w/ H. Pylori  Rec Tx with 1) Amoxicillin 1000 mg bid x 10 d 2) Clarithromycin 500 mg bid x 10 d 3) omeprazole 20 mg bid x 10 d 4) Stop atorvastatin day before she starts Rx and for 2 weeks total to avoid possible interaction   No letter or recall from [facility] I have cced her PCP also ______

## 2014-12-09 ENCOUNTER — Ambulatory Visit (INDEPENDENT_AMBULATORY_CARE_PROVIDER_SITE_OTHER): Payer: Commercial Managed Care - HMO | Admitting: *Deleted

## 2014-12-09 DIAGNOSIS — Z23 Encounter for immunization: Secondary | ICD-10-CM | POA: Diagnosis not present

## 2014-12-10 ENCOUNTER — Other Ambulatory Visit: Payer: Self-pay | Admitting: Family Medicine

## 2014-12-11 ENCOUNTER — Other Ambulatory Visit: Payer: Self-pay | Admitting: Internal Medicine

## 2014-12-11 MED ORDER — ALBUTEROL SULFATE HFA 108 (90 BASE) MCG/ACT IN AERS: 2.0000 | INHALATION_SPRAY | Freq: Four times a day (QID) | RESPIRATORY_TRACT | Status: DC | PRN

## 2014-12-11 MED ORDER — HYDROCHLOROTHIAZIDE 25 MG PO TABS: 25.0000 mg | ORAL_TABLET | Freq: Every day | ORAL | Status: DC

## 2014-12-11 MED ORDER — LISINOPRIL 20 MG PO TABS: 20.0000 mg | ORAL_TABLET | Freq: Every day | ORAL | Status: DC

## 2014-12-11 NOTE — Telephone Encounter (Signed)
Dr. Lacinda Axon is in a new office, please advise refill?

## 2014-12-11 NOTE — Telephone Encounter (Signed)
Pt called and needs a refill on her Lisinopril, Hydrochlorothiazide, and Albuterol called in to her [facility]. jw

## 2014-12-11 NOTE — Telephone Encounter (Signed)
I have called in her refills for her Albuterol, HCTZ, and Lisinopril for 90 day supplies with no refills. It looks like I've got an appointment scheduled with her for 11/4 so we can do further medication management at this appointment. Thanks, Cat Kellogg

## 2014-12-12 NOTE — Telephone Encounter (Signed)
LM with female to have pt return call to inform her of below. Katharina Caper, April D, [place]

## 2014-12-20 ENCOUNTER — Ambulatory Visit: Payer: Commercial Managed Care - HMO | Admitting: Internal Medicine

## 2015-01-02 ENCOUNTER — Ambulatory Visit: Payer: Commercial Managed Care - HMO | Admitting: Internal Medicine

## 2015-01-03 ENCOUNTER — Ambulatory Visit (INDEPENDENT_AMBULATORY_CARE_PROVIDER_SITE_OTHER): Payer: Commercial Managed Care - HMO | Admitting: Internal Medicine

## 2015-01-03 ENCOUNTER — Encounter: Payer: Self-pay | Admitting: Internal Medicine

## 2015-01-03 VITALS — BP 124/69 | HR 56 | Temp 97.8°F | Wt 182.0 lb

## 2015-01-03 DIAGNOSIS — M25561 Pain in right knee: Secondary | ICD-10-CM | POA: Diagnosis not present

## 2015-01-03 DIAGNOSIS — H9203 Otalgia, bilateral: Secondary | ICD-10-CM

## 2015-01-03 DIAGNOSIS — Z0000 Encounter for general adult medical examination without abnormal findings: Secondary | ICD-10-CM

## 2015-01-03 DIAGNOSIS — H9201 Otalgia, right ear: Secondary | ICD-10-CM

## 2015-01-03 DIAGNOSIS — R12 Heartburn: Secondary | ICD-10-CM | POA: Diagnosis not present

## 2015-01-03 HISTORY — DX: Otalgia, bilateral: H92.03

## 2015-01-03 LAB — POCT GLYCOSYLATED HEMOGLOBIN (HGB A1C): Hemoglobin A1C: 6.5

## 2015-01-03 MED ORDER — ALBUTEROL SULFATE HFA 108 (90 BASE) MCG/ACT IN AERS: 2.0000 | INHALATION_SPRAY | Freq: Four times a day (QID) | RESPIRATORY_TRACT | Status: DC | PRN

## 2015-01-03 MED ORDER — METHYLPREDNISOLONE ACETATE 40 MG/ML IJ SUSP
40.0000 mg | Freq: Once | INTRAMUSCULAR | Status: AC
  Administered 2015-01-03: 40 mg via INTRA_ARTICULAR

## 2015-01-03 MED ORDER — RANITIDINE HCL 150 MG PO TABS: 150.0000 mg | ORAL_TABLET | Freq: Every day | ORAL | Status: DC

## 2015-01-03 MED ORDER — NEOMYCIN-POLYMYXIN-HC 1 % OT SOLN: 3.0000 [drp] | Freq: Four times a day (QID) | OTIC | Status: DC

## 2015-01-03 NOTE — Assessment & Plan Note (Signed)
Chronic. Steroid injection performed today as patient has reported good temporary relief of pain with past injections. Encouraged continued use of knee brace and tylenol prn.

## 2015-01-03 NOTE — Assessment & Plan Note (Signed)
Chest pain seems consistent with GERD. No red flags for chest pain being cardiac in nature. Rx for ranitidine given. Will increase dose as needed. Advised continued use of Tums. Avoid spicy foods, caffeine, and EtOH as those are common triggers of reflux.

## 2015-01-03 NOTE — Assessment & Plan Note (Signed)
Could be related to otitis externa. Rx for Cortisporin Otic given. No cerumen to remove in R ear canal. Continue flonase which should help with possible eustachian tube dysfunction.

## 2015-01-03 NOTE — Progress Notes (Signed)
Subjective:    Susan Ingram - 74 y.o. female MRN [id number]  Date of birth: 1940/03/14  HPI  Susan Ingram is here for chronic knee pain, ear pain, and chest pain.  Chest Pain:  -pain present for about 2 weeks  -not constant  -epigastric and substernal location -describes a feeling of something coming back up from stomach -pain relieved with Tums and belching  -pain worsened by eating  -not worsened by lying flat  -pain does not radiate to arms, jaw, or neck  -past hx of tx for H pylori and GERD    Chronic Right Knee Pain 2/2 to OA -pain located anterior and medial to patella  -last knee injection performed in July 2016 -reports good pain relief from injection -takes Tylenol and using knee brace daily   Right Ear Pain -symptoms have been ongoing for several months  -describes pain in the ear canal  -she wipes ears out with washcloth daily but no longer uses Q tips  -has not had rhinorrhea or sneezing recently  -uses Flonase daily for allergic rhinitis    Health Maintenance Due  Topic Date Due  . TETANUS/TDAP  07/01/1958  . PNA vac Low Risk Adult (2 of 2 - PPSV23) 01/03/2015    -  reports that she quit smoking about 21 years ago. She does not have any smokeless tobacco history on file. - Review of Systems: Per HPI. - Past Medical History: Patient Active Problem List   Diagnosis Date Noted  . Ear discomfort 01/03/2015  . Helicobacter pylori gastritis 10/08/2014  . Intermittent dysphagia 08/14/2014  . Heme positive stool 07/24/2014  . Heartburn 06/18/2014  . Knee pain, right 04/09/2014  . Preventative health care 01/03/2014  . Hyperlipidemia 01/02/2014  . Hypertension 02/18/2012  . Asthma 02/18/2012  . History of vitamin D deficiency 02/18/2012  . History of lung cancer 11/24/2010   - Medications: reviewed and updated Current Outpatient Prescriptions  Medication Sig Dispense Refill  . acetaminophen (TYLENOL) 500 MG tablet Take 1,000 mg by mouth  every 6 (six) hours as needed for mild pain.     Marland Kitchen albuterol (PROVENTIL HFA;VENTOLIN HFA) 108 (90 BASE) MCG/ACT inhaler Inhale 2 puffs into the lungs every 6 (six) hours as needed for wheezing or shortness of breath. 1 Inhaler 2  . amoxicillin (AMOXIL) 500 MG capsule Take 2 capsules (1,000 mg total) by mouth 2 (two) times daily. 40 capsule 0  . atorvastatin (LIPITOR) 40 MG tablet Take 1 tablet (40 mg total) by mouth daily. 90 tablet 3  . calcium-vitamin D 250-100 MG-UNIT per tablet Take 1 tablet by mouth 2 (two) times daily.      . cetirizine (ZYRTEC) 10 MG tablet Take 1 tablet (10 mg total) by mouth daily. 30 tablet 11  . clarithromycin (BIAXIN) 500 MG tablet Take 1 tablet (500 mg total) by mouth 2 (two) times daily. 20 tablet 0  . fluticasone (FLONASE) 50 MCG/ACT nasal spray Place 2 sprays into both nostrils daily. 16 g 6  . hydrochlorothiazide (HYDRODIURIL) 25 MG tablet Take 1 tablet (25 mg total) by mouth daily. 90 tablet 0  . lisinopril (PRINIVIL,ZESTRIL) 20 MG tablet Take 1 tablet (20 mg total) by mouth daily. 90 tablet 0  . mupirocin ointment (BACTROBAN) 2 % Place 1 application into the nose 2 (two) times daily. X 7 days 22 g 0  . NEOMYCIN-POLYMYXIN-HYDROCORTISONE (CORTISPORIN) 1 % SOLN otic solution Place 3 drops into the right ear every 6 (six) hours. 10 mL 1  .  omeprazole (PRILOSEC) 20 MG capsule Take 1 capsule (20 mg total) by mouth daily. 20 capsule 0  . oxymetazoline (AFRIN NASAL SPRAY) 0.05 % nasal spray Place 1 spray into right nostril 2 (two) times daily. X 3 days only (Patient not taking: Reported on 09/06/2014) 30 mL 0  . ranitidine (ZANTAC) 150 MG tablet Take 1 tablet (150 mg total) by mouth at bedtime. 30 tablet 2  . traMADol (ULTRAM) 50 MG tablet Take 1 tablet (50 mg total) by mouth every 8 (eight) hours as needed. (Patient not taking: Reported on 09/06/2014) 60 tablet 0  . [DISCONTINUED] potassium chloride (K-DUR) 10 MEQ tablet Take 1 tablet (10 mEq total) by mouth daily. 30  tablet 6   No current facility-administered medications for this visit.     Review of Systems See HPI     Objective:   Physical Exam BP 124/69 mmHg  Pulse 56  Temp(Src) 97.8 F (36.6 C) (Oral)  Wt 182 lb (82.555 kg) Gen: NAD, alert, cooperative with exam, well-appearing HEENT: Mild amount of cerumen present in left ear canal, none noted in right ear canal. Bilateral TMs normal.  CV: RRR, good S1/S2, no murmur, chest non-tender to palpation MSK: No bony deformity or tenderness with palpation to R knee. Mild edema of right knee compared to left knee. ROM and sensation intact. Crepitus noted with passive ROM. Strength 5/5 bilaterally. Anterior drawer, posterior draw, and valgus/varus stress test normal . Mild medial and lateral meniscal instability with McMurray test. No warmth or erythema noted.  Psych: good insight, alert and oriented   Consent obtained and verified. Sterile betadine prep. Furthur cleansed with alcohol. Topical analgesic spray: Ethyl chloride. Joint: Right Knee Approached in typical fashion with: anterolateral approach  Completed without difficulty Meds: 1cc DepoMedrol, 4cc Lidocaine 1% Needle: 21 gauge  Aftercare instructions and Red flags advised.      Assessment & Plan:   Knee pain, right Chronic. Steroid injection performed today as patient has reported good temporary relief of pain with past injections. Encouraged continued use of knee brace and tylenol prn.   Heartburn Chest pain seems consistent with GERD. No red flags for chest pain being cardiac in nature. Rx for ranitidine given. Will increase dose as needed. Advised continued use of Tums. Avoid spicy foods, caffeine, and EtOH as those are common triggers of reflux.   Ear discomfort Could be related to otitis externa. Rx for Cortisporin Otic given. No cerumen to remove in R ear canal. Continue flonase which should help with possible eustachian tube dysfunction.    HgBA1C 6.5 at today's visit.  Recommend dietary and lifestyle modifications.   Phill Myron, D.O. 01/03/2015, 2:56 PM PGY-1, [place]

## 2015-01-03 NOTE — Patient Instructions (Signed)
Thank you for coming to see me today. It was a pleasure. Today we talked about:   1. Knee pain and gave you an injection. You can have another knee injection in 4 months.  2. Ear pain. I have prescribed you ear drops to help with the pain.  3. Your chest pain sounds consistent with heartburn. I have prescribed you Ranitidine to help you with the acid since the Tums aren't controlling it as well anymore.   Please follow-up with Dr. Juleen China in 1-2 months.   If you have any questions or concerns, please do not hesitate to call the office at [phone].  Take Care,   Phill Myron, DO

## 2015-01-20 ENCOUNTER — Emergency Department (INDEPENDENT_AMBULATORY_CARE_PROVIDER_SITE_OTHER)
Admission: EM | Admit: 2015-01-20 | Discharge: 2015-01-20 | Disposition: A | Discharge status: Home / Self Care | Payer: Commercial Managed Care - HMO | Source: Home / Self Care

## 2015-01-20 ENCOUNTER — Encounter (HOSPITAL_COMMUNITY): Payer: Self-pay

## 2015-01-20 DIAGNOSIS — R42 Dizziness and giddiness: Secondary | ICD-10-CM | POA: Diagnosis not present

## 2015-01-20 DIAGNOSIS — J069 Acute upper respiratory infection, unspecified: Secondary | ICD-10-CM | POA: Diagnosis not present

## 2015-01-20 MED ORDER — AMOXICILLIN 250 MG PO CAPS: 250.0000 mg | ORAL_CAPSULE | Freq: Three times a day (TID) | ORAL | Status: DC

## 2015-01-20 MED ORDER — AMOXICILLIN 500 MG PO CAPS: 500.0000 mg | ORAL_CAPSULE | Freq: Three times a day (TID) | ORAL | Status: DC

## 2015-01-20 NOTE — ED Provider Notes (Signed)
CSN: [id number]     Arrival date & time 01/20/15  1614 History   None    No chief complaint on file.  (Consider location/radiation/quality/duration/timing/severity/associated sxs/prior Treatment) HPI History obtained from patient:   LOCATION:head SEVERITY:no pain DURATION:2 days CONTEXT:sudden onset yesterday, harsh cough at night not as bad during the day QUALITY: MODIFYING FACTORS:none ASSOCIATED SYMPTOMS:feeling dizzy TIMING:occasional OCCUPATION: works in Medical sales representative  Past Medical History  Diagnosis Date  . Cancer ([place])     lung  . Hypertension   . Hypercholesterolemia   . Eczema   . Asthma   . Arthritis     RIGHT KNEE  . Helicobacter pylori gastritis 10/08/2014   Past Surgical History  Procedure Laterality Date  . Bronchoscopy, left video-assisted thoracoscopy, wedge  05/23/2008  . Left thyroid lobectomy, frozen section.  10/11/2002  . Vaginal hysterectomy     Family History  Problem Relation Age of Onset  . Alzheimer's disease Father   . Early death Mother   . Hypertension Mother   . Stroke Mother   . Diabetes Brother    Social History  Substance Use Topics  . Smoking status: Former Smoker    Quit date: 04/15/1993  . Smokeless tobacco: Not on file  . Alcohol Use: No   OB History    No data available     Review of Systems ROS +'ve dizzy, head cold  Denies: HEADACHE, NAUSEA, ABDOMINAL PAIN, CHEST PAIN, CONGESTION, DYSURIA, SHORTNESS OF BREATH  Allergies  Amlodipine; Maxzide; and Toprol xl  Home Medications   Prior to Admission medications   Medication Sig Start Date End Date Taking? Authorizing Provider  acetaminophen (TYLENOL) 500 MG tablet Take 1,000 mg by mouth every 6 (six) hours as needed for mild pain.     Historical Provider, MD  albuterol (PROVENTIL HFA;VENTOLIN HFA) 108 (90 BASE) MCG/ACT inhaler Inhale 2 puffs into the lungs every 6 (six) hours as needed for wheezing or shortness of breath. 01/03/15   Nicolette Bang, DO   amoxicillin (AMOXIL) 500 MG capsule Take 1 capsule (500 mg total) by mouth 3 (three) times daily. 01/20/15   Konrad Felix, PA  atorvastatin (LIPITOR) 40 MG tablet Take 1 tablet (40 mg total) by mouth daily. 04/09/14   Coral Spikes, DO  calcium-vitamin D 250-100 MG-UNIT per tablet Take 1 tablet by mouth 2 (two) times daily.      Historical Provider, MD  cetirizine (ZYRTEC) 10 MG tablet Take 1 tablet (10 mg total) by mouth daily. 01/02/14   Coral Spikes, DO  clarithromycin (BIAXIN) 500 MG tablet Take 1 tablet (500 mg total) by mouth 2 (two) times daily. 10/08/14   Gatha Mayer, MD  fluticasone (FLONASE) 50 MCG/ACT nasal spray Place 2 sprays into both nostrils daily. 01/02/14   Coral Spikes, DO  hydrochlorothiazide (HYDRODIURIL) 25 MG tablet Take 1 tablet (25 mg total) by mouth daily. 12/11/14   Nicolette Bang, DO  lisinopril (PRINIVIL,ZESTRIL) 20 MG tablet Take 1 tablet (20 mg total) by mouth daily. 12/11/14   Nicolette Bang, DO  mupirocin ointment (BACTROBAN) 2 % Place 1 application into the nose 2 (two) times daily. X 7 days 07/02/14   Audelia Hives Presson, PA  NEOMYCIN-POLYMYXIN-HYDROCORTISONE (CORTISPORIN) 1 % SOLN otic solution Place 3 drops into the right ear every 6 (six) hours. 01/03/15   Nicolette Bang, DO  omeprazole (PRILOSEC) 20 MG capsule Take 1 capsule (20 mg total) by mouth daily. 10/08/14   Gatha Mayer, MD  oxymetazoline (AFRIN NASAL SPRAY) 0.05 % nasal spray Place 1 spray into right nostril 2 (two) times daily. X 3 days only Patient not taking: Reported on 09/06/2014 07/02/14   Audelia Hives Presson, PA  ranitidine (ZANTAC) 150 MG tablet Take 1 tablet (150 mg total) by mouth at bedtime. 01/03/15   Nicolette Bang, DO  traMADol (ULTRAM) 50 MG tablet Take 1 tablet (50 mg total) by mouth every 8 (eight) hours as needed. Patient not taking: Reported on 09/06/2014 06/17/14   Coral Spikes, DO   Meds Ordered and Administered this Visit  Medications  - No data to display  BP 152/72 mmHg  Pulse 61  Temp(Src) 97.6 F (36.4 C) (Oral)  Resp 16  SpO2 100% No data found.   Physical Exam  Constitutional: She is oriented to person, place, and time. She appears well-developed and well-nourished.  HENT:  Head: Normocephalic and atraumatic.  Right Ear: External ear normal.  Left Ear: External ear normal.  Mouth/Throat: Oropharynx is clear and moist.  Eyes: Conjunctivae and EOM are normal. Pupils are equal, round, and reactive to light.  Pulmonary/Chest: Effort normal and breath sounds normal.  Musculoskeletal: Normal range of motion.  Neurological: She is alert and oriented to person, place, and time.  Skin: Skin is warm and dry.  Psychiatric: She has a normal mood and affect. Her behavior is normal. Judgment and thought content normal.  Nursing note and vitals reviewed.   ED Course  Procedures (including critical care time)  Labs Review Labs Reviewed - No data to display  Imaging Review No results found.   Visual Acuity Review  Right Eye Distance:   Left Eye Distance:   Bilateral Distance:    Right Eye Near:   Left Eye Near:    Bilateral Near:         MDM   1. Dizzy spells   2. Acute URI   Amoxil Return to work note Instructions of care Discharged in stable condition Normal neruo exam    Konrad Felix, [place] 01/20/15 1824

## 2015-01-20 NOTE — ED Notes (Signed)
eval by provider only

## 2015-01-20 NOTE — Discharge Instructions (Signed)
Benign Positional Vertigo °Vertigo is the feeling that you or your surroundings are moving when they are not. Benign positional vertigo is the most common form of vertigo. The cause of this condition is not serious (is benign). This condition is triggered by certain movements and positions (is positional). This condition can be dangerous if it occurs while you are doing something that could endanger you or others, such as driving.  °CAUSES °In many cases, the cause of this condition is not known. It may be caused by a disturbance in an area of the inner ear that helps your brain to sense movement and balance. This disturbance can be caused by a viral infection (labyrinthitis), head injury, or repetitive motion. °RISK FACTORS °This condition is more likely to develop in: °· Women. °· People who are 50 years of age or older. °SYMPTOMS °Symptoms of this condition usually happen when you move your head or your eyes in different directions. Symptoms may start suddenly, and they usually last for less than a minute. Symptoms may include: °· Loss of balance and falling. °· Feeling like you are spinning or moving. °· Feeling like your surroundings are spinning or moving. °· Nausea and vomiting. °· Blurred vision. °· Dizziness. °· Involuntary eye movement (nystagmus). °Symptoms can be mild and cause only slight annoyance, or they can be severe and interfere with daily life. Episodes of benign positional vertigo may return (recur) over time, and they may be triggered by certain movements. Symptoms may improve over time. °DIAGNOSIS °This condition is usually diagnosed by medical history and a physical exam of the head, neck, and ears. You may be referred to a health care provider who specializes in ear, nose, and throat (ENT) problems (otolaryngologist) or a provider who specializes in disorders of the nervous system (neurologist). You may have additional testing, including: °· MRI. °· A CT scan. °· Eye movement tests. Your  health care provider may ask you to change positions quickly while he or she watches you for symptoms of benign positional vertigo, such as nystagmus. Eye movement may be tested with an electronystagmogram (ENG), caloric stimulation, the Dix-Hallpike test, or the roll test. °· An electroencephalogram (EEG). This records electrical activity in your brain. °· Hearing tests. °TREATMENT °Usually, your health care provider will treat this by moving your head in specific positions to adjust your inner ear back to normal. Surgery may be needed in severe cases, but this is rare. In some cases, benign positional vertigo may resolve on its own in 2-4 weeks. °HOME CARE INSTRUCTIONS °Safety °· Move slowly. Avoid sudden body or head movements. °· Avoid driving. °· Avoid operating heavy machinery. °· Avoid doing any tasks that would be dangerous to you or others if a vertigo episode would occur. °· If you have trouble walking or keeping your balance, try using a cane for stability. If you feel dizzy or unstable, sit down right away. °· Return to your normal activities as told by your health care provider. Ask your health care provider what activities are safe for you. °General Instructions °· Take over-the-counter and prescription medicines only as told by your health care provider. °· Avoid certain positions or movements as told by your health care provider. °· Drink enough fluid to keep your urine clear or pale yellow. °· Keep all follow-up visits as told by your health care provider. This is important. °SEEK MEDICAL CARE IF: °· You have a fever. °· Your condition gets worse or you develop new symptoms. °· Your family or friends   notice any behavioral changes.  Your nausea or vomiting gets worse.  You have numbness or a "pins and needles" sensation. SEEK IMMEDIATE MEDICAL CARE IF:  You have difficulty speaking or moving.  You are always dizzy.  You faint.  You develop severe headaches.  You have weakness in your  legs or arms.  You have changes in your hearing or vision.  You develop a stiff neck.  You develop sensitivity to light.   This information is not intended to replace advice given to you by your health care provider. Make sure you discuss any questions you have with your health care provider.   Document Released: 11/09/2005 Document Revised: 10/23/2014 Document Reviewed: 05/27/2014 Elsevier Interactive Patient Education 2016 Elsevier Inc. Upper Respiratory Infection, Adult Most upper respiratory infections (URIs) are a viral infection of the air passages leading to the lungs. A URI affects the nose, throat, and upper air passages. The most common type of URI is nasopharyngitis and is typically referred to as "the common cold." URIs run their course and usually go away on their own. Most of the time, a URI does not require medical attention, but sometimes a bacterial infection in the upper airways can follow a viral infection. This is called a secondary infection. Sinus and middle ear infections are common types of secondary upper respiratory infections. Bacterial pneumonia can also complicate a URI. A URI can worsen asthma and chronic obstructive pulmonary disease (COPD). Sometimes, these complications can require emergency medical care and may be life threatening.  CAUSES Almost all URIs are caused by viruses. A virus is a type of germ and can spread from one person to another.  RISKS FACTORS You may be at risk for a URI if:   You smoke.   You have chronic heart or lung disease.  You have a weakened defense (immune) system.   You are very young or very old.   You have nasal allergies or asthma.  You work in crowded or poorly ventilated areas.  You work in health care facilities or schools. SIGNS AND SYMPTOMS  Symptoms typically develop 2-3 days after you come in contact with a cold virus. Most viral URIs last 7-10 days. However, viral URIs from the influenza virus (flu virus)  can last 14-18 days and are typically more severe. Symptoms may include:   Runny or stuffy (congested) nose.   Sneezing.   Cough.   Sore throat.   Headache.   Fatigue.   Fever.   Loss of appetite.   Pain in your forehead, behind your eyes, and over your cheekbones (sinus pain).  Muscle aches.  DIAGNOSIS  Your health care provider may diagnose a URI by:  Physical exam.  Tests to check that your symptoms are not due to another condition such as:  Strep throat.  Sinusitis.  Pneumonia.  Asthma. TREATMENT  A URI goes away on its own with time. It cannot be cured with medicines, but medicines may be prescribed or recommended to relieve symptoms. Medicines may help:  Reduce your fever.  Reduce your cough.  Relieve nasal congestion. HOME CARE INSTRUCTIONS   Take medicines only as directed by your health care provider.   Gargle warm saltwater or take cough drops to comfort your throat as directed by your health care provider.  Use a warm mist humidifier or inhale steam from a shower to increase air moisture. This may make it easier to breathe.  Drink enough fluid to keep your urine clear or pale yellow.  Eat soups and other clear broths and maintain good nutrition.   Rest as needed.   Return to work when your temperature has returned to normal or as your health care provider advises. You may need to stay home longer to avoid infecting others. You can also use a face mask and careful hand washing to prevent spread of the virus.  Increase the usage of your inhaler if you have asthma.   Do not use any tobacco products, including cigarettes, chewing tobacco, or electronic cigarettes. If you need help quitting, ask your health care provider. PREVENTION  The best way to protect yourself from getting a cold is to practice good hygiene.   Avoid oral or hand contact with people with cold symptoms.   Wash your hands often if contact occurs.  There is  no clear evidence that vitamin C, vitamin E, echinacea, or exercise reduces the chance of developing a cold. However, it is always recommended to get plenty of rest, exercise, and practice good nutrition.  SEEK MEDICAL CARE IF:   You are getting worse rather than better.   Your symptoms are not controlled by medicine.   You have chills.  You have worsening shortness of breath.  You have brown or red mucus.  You have yellow or brown nasal discharge.  You have pain in your face, especially when you bend forward.  You have a fever.  You have swollen neck glands.  You have pain while swallowing.  You have white areas in the back of your throat. SEEK IMMEDIATE MEDICAL CARE IF:   You have severe or persistent:  Headache.  Ear pain.  Sinus pain.  Chest pain.  You have chronic lung disease and any of the following:  Wheezing.  Prolonged cough.  Coughing up blood.  A change in your usual mucus.  You have a stiff neck.  You have changes in your:  Vision.  Hearing.  Thinking.  Mood. MAKE SURE YOU:   Understand these instructions.  Will watch your condition.  Will get help right away if you are not doing well or get worse.   This information is not intended to replace advice given to you by your health care provider. Make sure you discuss any questions you have with your health care provider.   Document Released: 07/28/2000 Document Revised: 06/18/2014 Document Reviewed: 05/09/2013 Elsevier Interactive Patient Education [employer].

## 2015-01-30 ENCOUNTER — Telehealth: Payer: Self-pay | Admitting: Internal Medicine

## 2015-01-30 NOTE — Telephone Encounter (Signed)
Pt called and needs refills on her allergy medication jw

## 2015-01-30 NOTE — Telephone Encounter (Signed)
Pt and also needs her heartburn medication also. jw

## 2015-01-31 MED ORDER — RANITIDINE HCL 150 MG PO TABS: 150.0000 mg | ORAL_TABLET | Freq: Every day | ORAL | Status: DC

## 2015-01-31 MED ORDER — CETIRIZINE HCL 10 MG PO TABS: 10.0000 mg | ORAL_TABLET | Freq: Every day | ORAL | Status: DC

## 2015-01-31 NOTE — Telephone Encounter (Signed)
I have refilled her Zantac and Zyrtec.   Phill Myron, D.O. 01/31/2015, 3:22 PM PGY-1, [facility]

## 2015-02-03 MED ORDER — FLUTICASONE PROPIONATE 50 MCG/ACT NA SUSP: 2.0000 | Freq: Every day | NASAL | Status: DC

## 2015-02-03 MED ORDER — CETIRIZINE HCL 10 MG PO TABS: 10.0000 mg | ORAL_TABLET | Freq: Every day | ORAL | Status: DC

## 2015-02-03 MED ORDER — RANITIDINE HCL 150 MG PO TABS: 150.0000 mg | ORAL_TABLET | Freq: Every day | ORAL | Status: DC

## 2015-02-03 MED ORDER — ALBUTEROL SULFATE HFA 108 (90 BASE) MCG/ACT IN AERS: 2.0000 | INHALATION_SPRAY | Freq: Four times a day (QID) | RESPIRATORY_TRACT | Status: DC | PRN

## 2015-02-03 NOTE — Telephone Encounter (Signed)
The patient is calling and states that the pharmacy told her that they have not received this request. [facility] at [address].  Additionally, pt needs a refill on albuterol and flonase. Thank you, Fonda Kinder, ASA

## 2015-02-03 NOTE — Addendum Note (Signed)
Addended by: Melina Schools on: 02/03/2015 09:30 PM   Modules accepted: Orders, Medications

## 2015-02-03 NOTE — Telephone Encounter (Signed)
Refills sent

## 2015-02-06 ENCOUNTER — Ambulatory Visit: Payer: Commercial Managed Care - HMO | Admitting: Family Medicine

## 2015-02-07 ENCOUNTER — Encounter: Payer: Self-pay | Admitting: Family Medicine

## 2015-02-07 ENCOUNTER — Ambulatory Visit (INDEPENDENT_AMBULATORY_CARE_PROVIDER_SITE_OTHER): Payer: Commercial Managed Care - HMO | Admitting: Family Medicine

## 2015-02-07 VITALS — BP 135/79 | HR 106 | Temp 99.6°F | Ht 66.0 in | Wt 180.7 lb

## 2015-02-07 DIAGNOSIS — B9789 Other viral agents as the cause of diseases classified elsewhere: Principal | ICD-10-CM

## 2015-02-07 DIAGNOSIS — J069 Acute upper respiratory infection, unspecified: Secondary | ICD-10-CM | POA: Diagnosis not present

## 2015-02-07 MED ORDER — AZITHROMYCIN 250 MG PO TABS: ORAL_TABLET | ORAL | Status: DC

## 2015-02-07 MED ORDER — ALBUTEROL SULFATE HFA 108 (90 BASE) MCG/ACT IN AERS: 2.0000 | INHALATION_SPRAY | Freq: Four times a day (QID) | RESPIRATORY_TRACT | Status: DC | PRN

## 2015-02-07 MED ORDER — HYDROCODONE-HOMATROPINE 5-1.5 MG/5ML PO SYRP: 5.0000 mL | ORAL_SOLUTION | Freq: Four times a day (QID) | ORAL | Status: DC | PRN

## 2015-02-07 NOTE — Progress Notes (Addendum)
Date of Visit: 02/07/2015   HPI:  Pt presents for a same day appointment to discuss cold symptoms. Patient reports feeling sick with a cold on and off for about 3 weeks. Has had cough, sore throat. Denies facial pain. Has had some nasal congestion. Has used albuterol 3-4 times a day. This sometimes helps. Endorses having chest pain when she coughs, but no chest pain or any other time. No fevers. Appetite is decreased. Stooling and urinating normally. No vomiting. Took anabolic a few weeks ago for an earring infection. Denies any lower extremity swelling. Endorses shortness of breath whenever she gets in a coughing fit, also feels somewhat short of breath when she walks outside in the cold air. Has not noticed herself wheezing. Major complaint is cough at night which keeps her from sleeping well.  ROS: See HPI  [place]: History of asthma,, hyperlipidemia, hypertension, vitamin D deficiency, lung cancer in remission  PHYSICAL EXAM: BP 135/79 mmHg  Pulse 106  Temp(Src) 99.6 F (37.6 C) (Oral)  Ht '5\' 6"'$  (1.676 m)  Wt 180 lb 11.2 oz (81.965 kg)  BMI 29.18 kg/m2  SpO2 96% Gen: No acute distress, pleasant, cooperative HEENT: Normocephalic, atraumatic, nasal turbinates mildly swollen. Oropharynx clear and moist. No anterior cervical lymphadenopathy. Tympanic membranes clear bilaterally. Heart: Regular rate and rhythm, no murmur Lungs: Clear to auscultation bilaterally with exception of very mild occasional end expiratory wheeze. Normal respiratory effort. Speaks in full sentences without distress. Occasional cough. Abdomen: Soft, nontender to palpation Extremities: No lower extremity edema bilaterally Neuro: Alert, grossly nonfocal, speech normal  ASSESSMENT/PLAN:  1. URI symptoms: With duration of symptoms around 2-3 weeks, suspect protracted viral bronchitis. As patient's biggest complaint right now is cough causing difficulty sleeping, I will cautiously prescribe Hycodan cough syrup. Counseled  on risk of sedation with this medicine. We'll also cover with azithromycin for atypical pneumonia/bronchitis. Continue albuterol as needed. Mild tachycardia noted. Suspect this is from albuterol use and coughing. Patient appears well hydrated today. No clinical symptoms of DVT and no hypoxia, so doubt PE as cause of tachycardia. Will proceed with above treatment, and follow up as needed if not improving.  NOTE: noted history of asthma after completion of visit. Patient had just faint end-expiratory wheezes on exam, with good aeration. Attempted to reach patient by phone two times after visit to offer trial of systemic steroids in case this is an asthma exacerbation but there was no answer, and no voicemail set up. If patient calls the on-call MD this weekend, would be reasonable to rx prednisone burst.  FOLLOW UP: F/u as needed if symptoms worsen or do not improve.   [place]. Ardelia Mems, [place]

## 2015-02-07 NOTE — Patient Instructions (Signed)
Take azithromycin - antibiotic. 2 pills today then 1 pill daily for the next 4 days Hycodan cough syrup. Use caution as it may make you sleepy. Do not take more than prescribed. Refilled albuterol inhaler as well  Return if not improving on these medications.   Be well, Dr. Ardelia Mems   Acute Bronchitis Bronchitis is inflammation of the airways that extend from the windpipe into the lungs (bronchi). The inflammation often causes mucus to develop. This leads to a cough, which is the most common symptom of bronchitis.  In acute bronchitis, the condition usually develops suddenly and goes away over time, usually in a couple weeks. Smoking, allergies, and asthma can make bronchitis worse. Repeated episodes of bronchitis may cause further lung problems.  CAUSES Acute bronchitis is most often caused by the same virus that causes a cold. The virus can spread from person to person (contagious) through coughing, sneezing, and touching contaminated objects. SIGNS AND SYMPTOMS   Cough.   Fever.   Coughing up mucus.   Body aches.   Chest congestion.   Chills.   Shortness of breath.   Sore throat.  DIAGNOSIS  Acute bronchitis is usually diagnosed through a physical exam. Your health care provider will also ask you questions about your medical history. Tests, such as chest X-rays, are sometimes done to rule out other conditions.  TREATMENT  Acute bronchitis usually goes away in a couple weeks. Oftentimes, no medical treatment is necessary. Medicines are sometimes given for relief of fever or cough. Antibiotic medicines are usually not needed but may be prescribed in certain situations. In some cases, an inhaler may be recommended to help reduce shortness of breath and control the cough. A cool mist vaporizer may also be used to help thin bronchial secretions and make it easier to clear the chest.  HOME CARE INSTRUCTIONS  Get plenty of rest.   Drink enough fluids to keep your urine  clear or pale yellow (unless you have a medical condition that requires fluid restriction). Increasing fluids may help thin your respiratory secretions (sputum) and reduce chest congestion, and it will prevent dehydration.   Take medicines only as directed by your health care provider.  If you were prescribed an antibiotic medicine, finish it all even if you start to feel better.  Avoid smoking and secondhand smoke. Exposure to cigarette smoke or irritating chemicals will make bronchitis worse. If you are a smoker, consider using nicotine gum or skin patches to help control withdrawal symptoms. Quitting smoking will help your lungs heal faster.   Reduce the chances of another bout of acute bronchitis by washing your hands frequently, avoiding people with cold symptoms, and trying not to touch your hands to your mouth, nose, or eyes.   Keep all follow-up visits as directed by your health care provider.  SEEK MEDICAL CARE IF: Your symptoms do not improve after 1 week of treatment.  SEEK IMMEDIATE MEDICAL CARE IF:  You develop an increased fever or chills.   You have chest pain.   You have severe shortness of breath.  You have bloody sputum.   You develop dehydration.  You faint or repeatedly feel like you are going to pass out.  You develop repeated vomiting.  You develop a severe headache. MAKE SURE YOU:   Understand these instructions.  Will watch your condition.  Will get help right away if you are not doing well or get worse.   This information is not intended to replace advice given to you by  your health care provider. Make sure you discuss any questions you have with your health care provider.   Document Released: 03/11/2004 Document Revised: 02/22/2014 Document Reviewed: 07/25/2012 Elsevier Interactive Patient Education [employer].

## 2015-03-26 ENCOUNTER — Other Ambulatory Visit: Payer: Self-pay | Admitting: *Deleted

## 2015-03-26 MED ORDER — LISINOPRIL 20 MG PO TABS: 20.0000 mg | ORAL_TABLET | Freq: Every day | ORAL | Status: DC

## 2015-03-26 MED ORDER — HYDROCHLOROTHIAZIDE 25 MG PO TABS: 25.0000 mg | ORAL_TABLET | Freq: Every day | ORAL | Status: DC

## 2015-04-17 ENCOUNTER — Encounter: Payer: Self-pay | Admitting: Family Medicine

## 2015-04-17 ENCOUNTER — Ambulatory Visit (INDEPENDENT_AMBULATORY_CARE_PROVIDER_SITE_OTHER): Payer: Commercial Managed Care - HMO | Admitting: Family Medicine

## 2015-04-17 VITALS — BP 140/59 | HR 70 | Temp 97.7°F | Wt 187.0 lb

## 2015-04-17 DIAGNOSIS — E118 Type 2 diabetes mellitus with unspecified complications: Secondary | ICD-10-CM

## 2015-04-17 DIAGNOSIS — R519 Headache, unspecified: Secondary | ICD-10-CM

## 2015-04-17 DIAGNOSIS — J309 Allergic rhinitis, unspecified: Secondary | ICD-10-CM

## 2015-04-17 DIAGNOSIS — H359 Unspecified retinal disorder: Secondary | ICD-10-CM | POA: Diagnosis not present

## 2015-04-17 DIAGNOSIS — R51 Headache: Secondary | ICD-10-CM

## 2015-04-17 DIAGNOSIS — E119 Type 2 diabetes mellitus without complications: Secondary | ICD-10-CM | POA: Diagnosis not present

## 2015-04-17 LAB — BASIC METABOLIC PANEL
BUN: 16 mg/dL (ref 7–25)
CO2: 29 mmol/L (ref 20–31)
Calcium: 9.5 mg/dL (ref 8.6–10.4)
Chloride: 102 mmol/L (ref 98–110)
Creat: 0.88 mg/dL (ref 0.60–0.93)
Glucose, Bld: 133 mg/dL — ABNORMAL HIGH (ref 65–99)
Potassium: 4 mmol/L (ref 3.5–5.3)
Sodium: 139 mmol/L (ref 135–146)

## 2015-04-17 LAB — POCT GLYCOSYLATED HEMOGLOBIN (HGB A1C): Hemoglobin A1C: 6.4

## 2015-04-17 MED ORDER — FLUTICASONE PROPIONATE 50 MCG/ACT NA SUSP: 2.0000 | Freq: Every day | NASAL | Status: DC

## 2015-04-17 NOTE — Progress Notes (Signed)
Date of Visit: 04/17/2015   HPI:  Patient presents for visit to discuss headaches.  Headache started 1 week ago. Taking tylenol and it helps some. R frontal headache. No sudden vision changes. No nausea or vomiting. No fever. No chest pain. This is a new headache for her.  Has nosebleeds on occ. History of allergies. Nosebleeds occur about once every 2 months. Just a couple drops usually. The last time it was a little heavier. This was about 3 weeks ago. Stopped pretty quickly. Last used flonase a long time ago. This time of year starts to get sinus issues.   Feels tired all the time for a long time. No recent rectal bleeding. Just has decreased energy. Occasionally feels lightheaded, no syncope. Wants to be checked for diabetes today.  ROS: See HPI.  [place]: history of chronic R knee pain, vit D deficiency, hypertension, prior lung cancer s/p VATS, asthma  PHYSICAL EXAM: BP 140/59 mmHg  Pulse 70  Temp(Src) 97.7 F (36.5 C) (Oral)  Wt 187 lb (84.823 kg)  Visual acuity: L 20/30  R 20/30  B 20/30  Gen: NAD, pleasant, cooperative HEENT: normocephalic, atraumatic. Pupils equal round and reactive to light. extraocular movements intact. No scleral injection or erythema. L eye with inability to see retina on funduscopic exam (unclear if due to cataract, not hazy in appearance, just dark). R retina appears normal on non-dilated exam. Nares patent Heart: regular rate and rhythm, no murmur Lungs: clear to auscultation bilaterally, normal work of breathing  Neuro: CN II-XII tested and intact. Normal FNF.  Ext: No appreciable lower extremity edema bilaterally   ASSESSMENT/PLAN:  Type 2 diabetes mellitus (HCC) A1c of 6.5 in November 2016 meets diagnostic criteria for diabetes. Discussed this with patient today. We did repeat her A1c during this visit and it was 6.4, now in prediabetes range. Recommend follow up with PCP, avoiding or decreasing carb intake. Patient agreeable.  Headache New focal  headache in older patient with history of lung cancer. Neuro exam is nonfocal today (except for inability to see L retina). Will proceed with MRI brain with contrast to rule out malignancy. Patient agreeable to this plan. BMET today to check renal fxn prior to MRI.  Retina disorder, left Recommend follow up with her eye doctor ASAP to have eye evaluated. Fortunately her vision is normal  Allergic rhinitis rx flonase today   FOLLOW UP: Follow up in several weeks with PCP for chronic medical issues See eye doctor  [place]. Ardelia Mems, [place]

## 2015-04-17 NOTE — Patient Instructions (Addendum)
You currently have prediabetes. Avoid sugary foods  Getting MRI of your brain Please see your eye doctor as soon as possible since I was not able to see the back of your eye on the left side  Sent in flonase Checking kidney function so you can get the MRI  Please follow up with Dr. Juleen China in a few weeks  Be well, Dr. Ardelia Mems

## 2015-04-22 ENCOUNTER — Other Ambulatory Visit: Payer: Self-pay

## 2015-04-22 DIAGNOSIS — H359 Unspecified retinal disorder: Secondary | ICD-10-CM | POA: Insufficient documentation

## 2015-04-22 DIAGNOSIS — Z1231 Encounter for screening mammogram for malignant neoplasm of breast: Secondary | ICD-10-CM

## 2015-04-22 DIAGNOSIS — R51 Headache: Secondary | ICD-10-CM

## 2015-04-22 DIAGNOSIS — R519 Headache, unspecified: Secondary | ICD-10-CM | POA: Insufficient documentation

## 2015-04-22 DIAGNOSIS — J309 Allergic rhinitis, unspecified: Secondary | ICD-10-CM | POA: Insufficient documentation

## 2015-04-22 HISTORY — DX: Unspecified retinal disorder: H35.9

## 2015-04-22 NOTE — Assessment & Plan Note (Signed)
New focal headache in older patient with history of lung cancer. Neuro exam is nonfocal today (except for inability to see L retina). Will proceed with MRI brain with contrast to rule out malignancy. Patient agreeable to this plan. BMET today to check renal fxn prior to MRI.

## 2015-04-22 NOTE — Assessment & Plan Note (Signed)
A1c of 6.5 in November 2016 meets diagnostic criteria for diabetes. Discussed this with patient today. We did repeat her A1c during this visit and it was 6.4, now in prediabetes range. Recommend follow up with PCP, avoiding or decreasing carb intake. Patient agreeable.

## 2015-04-22 NOTE — Assessment & Plan Note (Signed)
rx flonase today

## 2015-04-22 NOTE — Assessment & Plan Note (Signed)
Recommend follow up with her eye doctor ASAP to have eye evaluated. Fortunately her vision is normal

## 2015-04-25 ENCOUNTER — Ambulatory Visit (HOSPITAL_COMMUNITY)
Admission: RE | Admit: 2015-04-25 | Discharge: 2015-04-25 | Disposition: A | Discharge status: Home / Self Care | Payer: Commercial Managed Care - HMO | Source: Ambulatory Visit | Attending: Family Medicine | Admitting: Family Medicine

## 2015-04-25 DIAGNOSIS — R9089 Other abnormal findings on diagnostic imaging of central nervous system: Secondary | ICD-10-CM | POA: Diagnosis not present

## 2015-04-25 DIAGNOSIS — R51 Headache: Secondary | ICD-10-CM | POA: Diagnosis not present

## 2015-04-25 DIAGNOSIS — R519 Headache, unspecified: Secondary | ICD-10-CM

## 2015-04-25 MED ORDER — GADOBENATE DIMEGLUMINE 529 MG/ML IV SOLN
20.0000 mL | Freq: Once | INTRAVENOUS | Status: AC | PRN
  Administered 2015-04-25: 18 mL via INTRAVENOUS

## 2015-05-14 ENCOUNTER — Telehealth: Payer: Self-pay | Admitting: Internal Medicine

## 2015-05-14 NOTE — Telephone Encounter (Signed)
Pt came into the office requesting a referral to her eye dr. Mauricia Area Dr is Dr My Marin Comment  [address]  [phone]      [phone] number.  Her appt is April 4.  This is for a regular eye exam.  Please let pt know when it has been faxed

## 2015-05-15 ENCOUNTER — Ambulatory Visit: Payer: Commercial Managed Care - HMO

## 2015-05-15 ENCOUNTER — Ambulatory Visit
Admission: RE | Admit: 2015-05-15 | Discharge: 2015-05-15 | Disposition: A | Discharge status: Home / Self Care | Payer: Commercial Managed Care - HMO | Source: Ambulatory Visit

## 2015-05-15 DIAGNOSIS — Z1231 Encounter for screening mammogram for malignant neoplasm of breast: Secondary | ICD-10-CM

## 2015-05-19 NOTE — Telephone Encounter (Signed)
Susan Ingram from [facility] is calling to speak to [facility] about a referral . jw

## 2015-05-21 NOTE — Telephone Encounter (Signed)
Left message for Susan Ingram to return call.

## 2015-06-26 ENCOUNTER — Other Ambulatory Visit: Payer: Self-pay | Admitting: Internal Medicine

## 2015-06-26 NOTE — Telephone Encounter (Signed)
Refill request for HCTZ, lisinopril and Zyrtec.

## 2015-06-27 MED ORDER — LISINOPRIL 20 MG PO TABS: 20.0000 mg | ORAL_TABLET | Freq: Every day | ORAL | Status: DC

## 2015-06-27 MED ORDER — HYDROCHLOROTHIAZIDE 25 MG PO TABS: 25.0000 mg | ORAL_TABLET | Freq: Every day | ORAL | Status: DC

## 2015-06-27 MED ORDER — CETIRIZINE HCL 10 MG PO TABS: 10.0000 mg | ORAL_TABLET | Freq: Every day | ORAL | Status: DC

## 2015-08-22 ENCOUNTER — Emergency Department (HOSPITAL_COMMUNITY): Payer: Commercial Managed Care - HMO

## 2015-08-22 ENCOUNTER — Emergency Department (HOSPITAL_COMMUNITY)
Admission: EM | Admit: 2015-08-22 | Discharge: 2015-08-23 | Disposition: A | Discharge status: Home / Self Care | Payer: Commercial Managed Care - HMO | Attending: Emergency Medicine | Admitting: Emergency Medicine

## 2015-08-22 ENCOUNTER — Ambulatory Visit (HOSPITAL_COMMUNITY)
Admission: EM | Admit: 2015-08-22 | Discharge: 2015-08-22 | Disposition: A | Discharge status: Nursing Home (Includes ICF) | Payer: Commercial Managed Care - HMO | Attending: Family Medicine | Admitting: Family Medicine

## 2015-08-22 ENCOUNTER — Encounter (HOSPITAL_COMMUNITY): Payer: Self-pay | Admitting: Emergency Medicine

## 2015-08-22 ENCOUNTER — Encounter (HOSPITAL_COMMUNITY): Payer: Self-pay | Admitting: *Deleted

## 2015-08-22 DIAGNOSIS — R791 Abnormal coagulation profile: Secondary | ICD-10-CM | POA: Insufficient documentation

## 2015-08-22 DIAGNOSIS — R202 Paresthesia of skin: Secondary | ICD-10-CM | POA: Insufficient documentation

## 2015-08-22 DIAGNOSIS — R29898 Other symptoms and signs involving the musculoskeletal system: Secondary | ICD-10-CM | POA: Diagnosis not present

## 2015-08-22 DIAGNOSIS — Z79899 Other long term (current) drug therapy: Secondary | ICD-10-CM | POA: Insufficient documentation

## 2015-08-22 DIAGNOSIS — Z85118 Personal history of other malignant neoplasm of bronchus and lung: Secondary | ICD-10-CM | POA: Diagnosis not present

## 2015-08-22 DIAGNOSIS — M4802 Spinal stenosis, cervical region: Secondary | ICD-10-CM | POA: Insufficient documentation

## 2015-08-22 DIAGNOSIS — J45909 Unspecified asthma, uncomplicated: Secondary | ICD-10-CM | POA: Diagnosis not present

## 2015-08-22 DIAGNOSIS — I10 Essential (primary) hypertension: Secondary | ICD-10-CM | POA: Diagnosis not present

## 2015-08-22 DIAGNOSIS — R200 Anesthesia of skin: Secondary | ICD-10-CM | POA: Diagnosis present

## 2015-08-22 DIAGNOSIS — Z7982 Long term (current) use of aspirin: Secondary | ICD-10-CM | POA: Insufficient documentation

## 2015-08-22 DIAGNOSIS — Z87891 Personal history of nicotine dependence: Secondary | ICD-10-CM | POA: Insufficient documentation

## 2015-08-22 LAB — CBG MONITORING, ED: Glucose-Capillary: 111 mg/dL — ABNORMAL HIGH (ref 65–99)

## 2015-08-22 LAB — I-STAT TROPONIN, ED: Troponin i, poc: 0 ng/mL (ref 0.00–0.08)

## 2015-08-22 LAB — PROTIME-INR
INR: 1.03 (ref 0.00–1.49)
Prothrombin Time: 13.7 seconds (ref 11.6–15.2)

## 2015-08-22 LAB — COMPREHENSIVE METABOLIC PANEL
ALT: 17 U/L (ref 14–54)
AST: 22 U/L (ref 15–41)
Albumin: 3.7 g/dL (ref 3.5–5.0)
Alkaline Phosphatase: 118 U/L (ref 38–126)
Anion gap: 7 (ref 5–15)
BUN: 17 mg/dL (ref 6–20)
CO2: 26 mmol/L (ref 22–32)
Calcium: 9.5 mg/dL (ref 8.9–10.3)
Chloride: 103 mmol/L (ref 101–111)
Creatinine, Ser: 0.95 mg/dL (ref 0.44–1.00)
GFR calc Af Amer: >60 mL/min (ref >60)
GFR calc non Af Amer: 57 mL/min — ABNORMAL LOW (ref >60)
Glucose, Bld: 107 mg/dL — ABNORMAL HIGH (ref 65–99)
Potassium: 3.3 mmol/L — ABNORMAL LOW (ref 3.5–5.1)
Sodium: 136 mmol/L (ref 135–145)
Total Bilirubin: 0.5 mg/dL (ref 0.3–1.2)
Total Protein: 7.4 g/dL (ref 6.5–8.1)

## 2015-08-22 LAB — I-STAT CHEM 8, ED
BUN: 20 mg/dL (ref 6–20)
Calcium, Ion: 1.17 mmol/L (ref 1.12–1.23)
Chloride: 100 mmol/L — ABNORMAL LOW (ref 101–111)
Creatinine, Ser: 1 mg/dL (ref 0.44–1.00)
Glucose, Bld: 105 mg/dL — ABNORMAL HIGH (ref 65–99)
HCT: 41 % (ref 36.0–46.0)
Hemoglobin: 13.9 g/dL (ref 12.0–15.0)
Potassium: 3.3 mmol/L — ABNORMAL LOW (ref 3.5–5.1)
Sodium: 141 mmol/L (ref 135–145)
TCO2: 28 mmol/L (ref 0–100)

## 2015-08-22 LAB — CBC
HCT: 39.4 % (ref 36.0–46.0)
Hemoglobin: 12.9 g/dL (ref 12.0–15.0)
MCH: 27.8 pg (ref 26.0–34.0)
MCHC: 32.7 g/dL (ref 30.0–36.0)
MCV: 84.9 fL (ref 78.0–100.0)
Platelets: 288 10*3/uL (ref 150–400)
RBC: 4.64 MIL/uL (ref 3.87–5.11)
RDW: 14.9 % (ref 11.5–15.5)
WBC: 7.4 10*3/uL (ref 4.0–10.5)

## 2015-08-22 LAB — DIFFERENTIAL
Basophils Absolute: 0 10*3/uL (ref 0.0–0.1)
Basophils Relative: 1 %
Eosinophils Absolute: 0.5 10*3/uL (ref 0.0–0.7)
Eosinophils Relative: 7 %
Lymphocytes Relative: 31 %
Lymphs Abs: 2.3 10*3/uL (ref 0.7–4.0)
Monocytes Absolute: 1 10*3/uL (ref 0.1–1.0)
Monocytes Relative: 14 %
Neutro Abs: 3.5 10*3/uL (ref 1.7–7.7)
Neutrophils Relative %: 47 %

## 2015-08-22 LAB — APTT: aPTT: 34 seconds (ref 24–37)

## 2015-08-22 NOTE — ED Notes (Signed)
Pt reports numbness and tingling to right hand since 7/4, now having tingling to left hand also. Reports it comes in waves and when it occurs, she has difficulty gripping an object. Grips are equal at triage,.

## 2015-08-22 NOTE — ED Notes (Signed)
Patient transported to MRI 

## 2015-08-22 NOTE — ED Provider Notes (Signed)
CSN: [id number]     Arrival date & time 08/22/15  1533 History   First MD Initiated Contact with Patient 08/22/15 1605     Chief Complaint  Patient presents with  . Tingling  . Numbness  . Shortness of Breath   (Consider location/radiation/quality/duration/timing/severity/associated sxs/prior Treatment) HPI Comments: 75 year old female, poor historian states she has been having tingling in both hands for 3 days. Rather sudden onset on July 4. This started in the right hand and now numbness and tingling has ascended up to the shoulder. She initially stated that she had weakness in the right hand and sometimes  cannot pick anything up. At this time she is having some numbness in the hand but there is no evidence of weakness. She states that a day or 2 later she developed numbness in the left hand. Patient states that she is afraid she is having a stroke. She went down to the emergency department and determined that she would have to wait a long time so decided to come to the urgent care thinking that we could get her seen quicker in the emergency department. She is requesting to go to the emergency department for evaluation.  She is fully awake and alert and oriented. In no distress. She is moving all extremities. She is showing good strength in bilateral upper extremities and lower extremities. She is ambulatory with a smooth and balanced gait. She is having no chest pain or shortness of breath. No headache, dizziness, nausea or vomiting.  Patient is a 75 y.o. female presenting with shortness of breath.  Shortness of Breath Associated symptoms: no chest pain, no cough and no headaches     Past Medical History  Diagnosis Date  . Cancer ([place])     lung  . Hypertension   . Hypercholesterolemia   . Eczema   . Asthma   . Arthritis     RIGHT KNEE  . Helicobacter pylori gastritis 10/08/2014   Past Surgical History  Procedure Laterality Date  . Bronchoscopy, left video-assisted thoracoscopy,  wedge  05/23/2008  . Left thyroid lobectomy, frozen section.  10/11/2002  . Vaginal hysterectomy     Family History  Problem Relation Age of Onset  . Alzheimer's disease Father   . Early death Mother   . Hypertension Mother   . Stroke Mother   . Diabetes Brother    Social History  Substance Use Topics  . Smoking status: Former Smoker    Quit date: 04/15/1993  . Smokeless tobacco: None  . Alcohol Use: No   OB History    No data available     Review of Systems  Constitutional: Negative for chills, activity change and fatigue.  HENT: Negative.   Eyes: Negative.   Respiratory: Negative for cough and shortness of breath.   Cardiovascular: Negative for chest pain and leg swelling.  Gastrointestinal: Negative.   Genitourinary: Negative.   Musculoskeletal:       Weakness in the right upper extremity  Skin: Negative.   Neurological: Negative for dizziness, speech difficulty and headaches.  Psychiatric/Behavioral: The patient is nervous/anxious.     Allergies  Amlodipine; Maxzide; and Toprol xl  Home Medications   Prior to Admission medications   Medication Sig Start Date End Date Taking? Authorizing Provider  acetaminophen (TYLENOL) 500 MG tablet Take 1,000 mg by mouth every 6 (six) hours as needed for mild pain.     Historical Provider, MD  albuterol (PROVENTIL HFA;VENTOLIN HFA) 108 (90 BASE) MCG/ACT inhaler Inhale 2 puffs into  the lungs every 6 (six) hours as needed for wheezing or shortness of breath. 02/07/15   Leeanne Rio, MD  atorvastatin (LIPITOR) 40 MG tablet Take 1 tablet (40 mg total) by mouth daily. 04/09/14   Coral Spikes, DO  azithromycin (ZITHROMAX) 250 MG tablet Take 2 tabs by mouth on day 1, followed by one tab by mouth daily on days 2-5 02/07/15   Leeanne Rio, MD  calcium-vitamin D 250-100 MG-UNIT per tablet Take 1 tablet by mouth 2 (two) times daily.      Historical Provider, MD  cetirizine (ZYRTEC) 10 MG tablet Take 1 tablet (10 mg total) by  mouth daily. 06/27/15   Nicolette Bang, DO  clarithromycin (BIAXIN) 500 MG tablet Take 1 tablet (500 mg total) by mouth 2 (two) times daily. 10/08/14   Gatha Mayer, MD  fluticasone (FLONASE) 50 MCG/ACT nasal spray Place 2 sprays into both nostrils daily. 04/17/15   Leeanne Rio, MD  hydrochlorothiazide (HYDRODIURIL) 25 MG tablet Take 1 tablet (25 mg total) by mouth daily. 06/27/15   Nicolette Bang, DO  HYDROcodone-homatropine [facility]) 5-1.5 MG/5ML syrup Take 5 mLs by mouth every 6 (six) hours as needed for cough. 02/07/15   Leeanne Rio, MD  lisinopril (PRINIVIL,ZESTRIL) 20 MG tablet Take 1 tablet (20 mg total) by mouth daily. 06/27/15   Nicolette Bang, DO  mupirocin ointment (BACTROBAN) 2 % Place 1 application into the nose 2 (two) times daily. X 7 days 07/02/14   Audelia Hives Presson, PA  NEOMYCIN-POLYMYXIN-HYDROCORTISONE (CORTISPORIN) 1 % SOLN otic solution Place 3 drops into the right ear every 6 (six) hours. 01/03/15   Nicolette Bang, DO  omeprazole (PRILOSEC) 20 MG capsule Take 1 capsule (20 mg total) by mouth daily. 10/08/14   Gatha Mayer, MD  oxymetazoline (AFRIN NASAL SPRAY) 0.05 % nasal spray Place 1 spray into right nostril 2 (two) times daily. X 3 days only Patient not taking: Reported on 09/06/2014 07/02/14   Audelia Hives Presson, PA  ranitidine (ZANTAC) 150 MG tablet Take 1 tablet (150 mg total) by mouth at bedtime. 02/03/15   Nicolette Bang, DO  traMADol (ULTRAM) 50 MG tablet Take 1 tablet (50 mg total) by mouth every 8 (eight) hours as needed. Patient not taking: Reported on 09/06/2014 06/17/14   Coral Spikes, DO   Meds Ordered and Administered this Visit  Medications - No data to display  BP 120/69 mmHg  Pulse 70  Temp(Src) 98.4 F (36.9 C) (Oral)  Resp 16  SpO2 100% No data found.   Physical Exam  Constitutional: She is oriented to person, place, and time. She appears well-developed and well-nourished. No  distress.  Eyes: EOM are normal.  Neck: Normal range of motion. Neck supple.  Cardiovascular: Normal rate.   Pulmonary/Chest: Effort normal.  Musculoskeletal:  Patient demonstrates normal strength to the both upper extremities. She states she is able to sense light touch equally bilaterally. No asymmetry is appreciated. There is no tenderness to the upper extremity or shoulder.   Neurological: She is alert and oriented to person, place, and time. She has normal strength. No cranial nerve deficit. She exhibits normal muscle tone. GCS eye subscore is 4. GCS verbal subscore is 5. GCS motor subscore is 6.  Equivocal positive response to Phalen's sign. Patient has difficulty in following instructions and maintaining a flexed position with both wrists.  Skin: Skin is warm and dry.  Nursing note and vitals reviewed.  ED Course  Procedures (including critical care time)  Labs Review Labs Reviewed - No data to display  Imaging Review No results found.   Visual Acuity Review  Right Eye Distance:   Left Eye Distance:   Bilateral Distance:    Right Eye Near:   Left Eye Near:    Bilateral Near:         MDM   1. Paresthesia of right arm   2. Right arm weakness    Patient has being transferred to the emergency department in part by her request and fear of having a stroke. She is complaining of paresthesias and weakness of the right upper extremityFor 3 days. She states it is getting worse. The current exam shows no evidence of weakness.  She has a history of lung cancer and hypertension. 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  Janne Napoleon, NP 08/22/15 [phone]

## 2015-08-22 NOTE — ED Provider Notes (Signed)
CSN: [id number]     Arrival date & time 08/22/15  1655 History   First MD Initiated Contact with Patient 08/22/15 2122     Chief Complaint  Patient presents with  . Numbness     (Consider location/radiation/quality/duration/timing/severity/associated sxs/prior Treatment) HPI Comments: 75yo F w/ PMH incluing lung CA, HTN, HLD who presents with hand numbness. Patient states that 3 days ago, she began having intermittent numbness of her right hand. She has started having numbness and tingling in her left hand as well. Her symptoms are intermittent and not associated with any activity. She has also noted some weakness in her right hand, particularly when trying to pick something up. She initially denied any neck pain but later notes that she has had some intermittent left-sided neck pain chronically. She denies any headache, new visual changes, leg weakness or numbness, or difficulty walking. No other symptoms. Patient has been well recently with no recent fevers or illness.  The history is provided by the patient.    Past Medical History  Diagnosis Date  . Cancer ([place])     lung  . Hypertension   . Hypercholesterolemia   . Eczema   . Asthma   . Arthritis     RIGHT KNEE  . Helicobacter pylori gastritis 10/08/2014   Past Surgical History  Procedure Laterality Date  . Bronchoscopy, left video-assisted thoracoscopy, wedge  05/23/2008  . Left thyroid lobectomy, frozen section.  10/11/2002  . Vaginal hysterectomy     Family History  Problem Relation Age of Onset  . Alzheimer's disease Father   . Early death Mother   . Hypertension Mother   . Stroke Mother   . Diabetes Brother    Social History  Substance Use Topics  . Smoking status: Former Smoker    Quit date: 04/15/1993  . Smokeless tobacco: None  . Alcohol Use: No   OB History    No data available     Review of Systems 10 Systems reviewed and are negative for acute change except as noted in the HPI.    Allergies   Amlodipine; Maxzide; and Toprol xl  Home Medications   Prior to Admission medications   Medication Sig Start Date End Date Taking? Authorizing Provider  acetaminophen (TYLENOL) 500 MG tablet Take 1,000 mg by mouth every 6 (six) hours as needed for mild pain.    Yes Historical Provider, MD  albuterol (PROVENTIL HFA;VENTOLIN HFA) 108 (90 BASE) MCG/ACT inhaler Inhale 2 puffs into the lungs every 6 (six) hours as needed for wheezing or shortness of breath. 02/07/15  Yes Leeanne Rio, MD  aspirin EC 81 MG tablet Take 81 mg by mouth daily.   Yes Historical Provider, MD  cetirizine (ZYRTEC) 10 MG tablet Take 1 tablet (10 mg total) by mouth daily. 06/27/15  Yes Nicolette Bang, DO  fluticasone (FLONASE) 50 MCG/ACT nasal spray Place 2 sprays into both nostrils daily. 04/17/15  Yes Leeanne Rio, MD  hydrochlorothiazide (HYDRODIURIL) 25 MG tablet Take 1 tablet (25 mg total) by mouth daily. 06/27/15  Yes Nicolette Bang, DO  lisinopril (PRINIVIL,ZESTRIL) 20 MG tablet Take 1 tablet (20 mg total) by mouth daily. 06/27/15  Yes Nicolette Bang, DO  atorvastatin (LIPITOR) 40 MG tablet Take 1 tablet (40 mg total) by mouth daily. 04/09/14   Coral Spikes, DO  azithromycin (ZITHROMAX) 250 MG tablet Take 2 tabs by mouth on day 1, followed by one tab by mouth daily on days 2-5 02/07/15   [place]  Stoney Bang, MD  clarithromycin (BIAXIN) 500 MG tablet Take 1 tablet (500 mg total) by mouth 2 (two) times daily. 10/08/14   Gatha Mayer, MD  HYDROcodone-homatropine [facility]) 5-1.5 MG/5ML syrup Take 5 mLs by mouth every 6 (six) hours as needed for cough. 02/07/15   Leeanne Rio, MD  mupirocin ointment (BACTROBAN) 2 % Place 1 application into the nose 2 (two) times daily. X 7 days 07/02/14   Audelia Hives Presson, PA  NEOMYCIN-POLYMYXIN-HYDROCORTISONE (CORTISPORIN) 1 % SOLN otic solution Place 3 drops into the right ear every 6 (six) hours. 01/03/15   Nicolette Bang, DO   omeprazole (PRILOSEC) 20 MG capsule Take 1 capsule (20 mg total) by mouth daily. 10/08/14   Gatha Mayer, MD  oxymetazoline (AFRIN NASAL SPRAY) 0.05 % nasal spray Place 1 spray into right nostril 2 (two) times daily. X 3 days only Patient not taking: Reported on 09/06/2014 07/02/14   Audelia Hives Presson, PA  ranitidine (ZANTAC) 150 MG tablet Take 1 tablet (150 mg total) by mouth at bedtime. 02/03/15   Nicolette Bang, DO  traMADol (ULTRAM) 50 MG tablet Take 1 tablet (50 mg total) by mouth every 8 (eight) hours as needed. Patient not taking: Reported on 09/06/2014 06/17/14   Jayce G Cook, DO   BP 120/64 mmHg  Pulse 52  Temp(Src) 97.6 F (36.4 C) (Oral)  Resp 16  Ht '5\' 6"'$  (1.676 m)  Wt 180 lb (81.647 kg)  BMI 29.07 kg/m2  SpO2 100% Physical Exam  Constitutional: She is oriented to person, place, and time. She appears well-developed and well-nourished. No distress.  Awake, alert  HENT:  Head: Normocephalic and atraumatic.  Eyes: Conjunctivae and EOM are normal. Pupils are equal, round, and reactive to light.  Neck: Neck supple.  Cardiovascular: Normal rate, regular rhythm and normal heart sounds.   No murmur heard. Pulmonary/Chest: Effort normal and breath sounds normal. No respiratory distress.  Abdominal: Soft. Bowel sounds are normal. She exhibits no distension.  Musculoskeletal: She exhibits no edema.  Neurological: She is alert and oriented to person, place, and time. She has normal reflexes. No cranial nerve deficit. She exhibits normal muscle tone.  Fluent speech, normal finger-to-nose testing,very subtle pronator drift on R, no clonus 5/5 strength and normal sensation BLE 5/5 strength BUE Mildly decreased sensation hands b/l   Skin: Skin is warm and dry.  Psychiatric: She has a normal mood and affect. Judgment and thought content normal.  Nursing note and vitals reviewed.   ED Course  Procedures (including critical care time) Labs Review Labs Reviewed   COMPREHENSIVE METABOLIC PANEL - Abnormal; Notable for the following:    Potassium 3.3 (*)    Glucose, Bld 107 (*)    GFR calc non Af Amer 57 (*)    All other components within normal limits  CBG MONITORING, ED - Abnormal; Notable for the following:    Glucose-Capillary 111 (*)    All other components within normal limits  I-STAT CHEM 8, ED - Abnormal; Notable for the following:    Potassium 3.3 (*)    Chloride 100 (*)    Glucose, Bld 105 (*)    All other components within normal limits  PROTIME-INR  APTT  CBC  DIFFERENTIAL  I-STAT TROPOININ, ED    Imaging Review Ct Head Wo Contrast  08/22/2015  CLINICAL DATA:  Tingling of the right face and hand beginning 3 days ago. EXAM: CT HEAD WITHOUT CONTRAST TECHNIQUE: Contiguous axial images were obtained  from the base of the skull through the vertex without intravenous contrast. COMPARISON:  MRI 04/25/2015 FINDINGS: The brain has a normal appearance without evidence of malformation, atrophy, old or acute infarction, mass lesion, hemorrhage, hydrocephalus or extra-axial collection. The calvarium is unremarkable. The paranasal sinuses, middle ears and mastoids are clear. IMPRESSION: Normal head CT Electronically Signed   By: Nelson Chimes M.D.   On: 08/22/2015 18:37   I have personally reviewed and evaluated these lab results as part of my medical decision-making.   EKG Interpretation   Date/Time:  Friday August 22 2015 16:58:26 EDT Ventricular Rate:  60 PR Interval:  176 QRS Duration: 76 QT Interval:  408 QTC Calculation: 408 R Axis:   72 Text Interpretation:  Normal sinus rhythm Normal ECG No significant change  since last tracing Confirmed by Allura Doepke MD, Paul Torpey [phone]) on 08/22/2015  8:45:47 PM      MDM   Final diagnoses:  None   Patient presents with a few days of intermittent numbness of right hand and now bilateral hands as well as intermittent right grip weakness. She was well-appearing with reassuring vital signs. Neuro exam  notable for mildly decreased sensation on bilateral hands and very subtle pronator drift on the right. The remainder of her neurologic exam was normal. Differential includes intracranial process or nerve impingement from cervical process. No infectious symptoms to suggest infection as cause. Her lab work is unremarkable. I have ordered an MRI of brain and C-spine to r/o acute process. If these images are reassuring, I feel the patient will be able to go home. I am signing out to the oncoming provider who will follow-up on the patient's imaging.   Sharlett Iles, MD 08/23/15 [phone]

## 2015-08-22 NOTE — ED Notes (Signed)
C/O NUMBNESS AND TINGLING IN BILATERAL HAND RADIATING TO ARMS AND FLANK SIDE

## 2015-08-23 NOTE — ED Provider Notes (Signed)
Patient signed out to follow-up MRI. MRI mostly reassuring. She does have some cervical canal stenosis but no cord enhancement.  This information was provided to the patient. Discharged home per Dr. Unk Pinto discharge instructions.  Results for orders placed or performed during the hospital encounter of 08/22/15  Protime-INR  Result Value Ref Range   Prothrombin Time 13.7 11.6 - 15.2 seconds   INR 1.03 0.00 - 1.49  APTT  Result Value Ref Range   aPTT 34 24 - 37 seconds  CBC  Result Value Ref Range   WBC 7.4 4.0 - 10.5 K/uL   RBC 4.64 3.87 - 5.11 MIL/uL   Hemoglobin 12.9 12.0 - 15.0 g/dL   HCT 39.4 36.0 - 46.0 %   MCV 84.9 78.0 - 100.0 fL   MCH 27.8 26.0 - 34.0 pg   MCHC 32.7 30.0 - 36.0 g/dL   RDW 14.9 11.5 - 15.5 %   Platelets 288 150 - 400 K/uL  Differential  Result Value Ref Range   Neutrophils Relative % 47 %   Neutro Abs 3.5 1.7 - 7.7 K/uL   Lymphocytes Relative 31 %   Lymphs Abs 2.3 0.7 - 4.0 K/uL   Monocytes Relative 14 %   Monocytes Absolute 1.0 0.1 - 1.0 K/uL   Eosinophils Relative 7 %   Eosinophils Absolute 0.5 0.0 - 0.7 K/uL   Basophils Relative 1 %   Basophils Absolute 0.0 0.0 - 0.1 K/uL  Comprehensive metabolic panel  Result Value Ref Range   Sodium 136 135 - 145 mmol/L   Potassium 3.3 (L) 3.5 - 5.1 mmol/L   Chloride 103 101 - 111 mmol/L   CO2 26 22 - 32 mmol/L   Glucose, Bld 107 (H) 65 - 99 mg/dL   BUN 17 6 - 20 mg/dL   Creatinine, Ser 0.95 0.44 - 1.00 mg/dL   Calcium 9.5 8.9 - 10.3 mg/dL   Total Protein 7.4 6.5 - 8.1 g/dL   Albumin 3.7 3.5 - 5.0 g/dL   AST 22 15 - 41 U/L   ALT 17 14 - 54 U/L   Alkaline Phosphatase 118 38 - 126 U/L   Total Bilirubin 0.5 0.3 - 1.2 mg/dL   GFR calc non Af Amer 57 (L) >60 mL/min   GFR calc Af Amer >60 >60 mL/min   Anion gap 7 5 - 15  I-stat troponin, ED  Result Value Ref Range   Troponin i, poc 0.00 0.00 - 0.08 ng/mL   Comment 3          CBG monitoring, ED  Result Value Ref Range   Glucose-Capillary 111 (H) 65 - 99  mg/dL   Comment 1 Notify RN    Comment 2 Document in Chart   I-Stat Chem 8, ED  Result Value Ref Range   Sodium 141 135 - 145 mmol/L   Potassium 3.3 (L) 3.5 - 5.1 mmol/L   Chloride 100 (L) 101 - 111 mmol/L   BUN 20 6 - 20 mg/dL   Creatinine, Ser 1.00 0.44 - 1.00 mg/dL   Glucose, Bld 105 (H) 65 - 99 mg/dL   Calcium, Ion 1.17 1.12 - 1.23 mmol/L   TCO2 28 0 - 100 mmol/L   Hemoglobin 13.9 12.0 - 15.0 g/dL   HCT 41.0 36.0 - 46.0 %   Ct Head Wo Contrast  08/22/2015  CLINICAL DATA:  Tingling of the right face and hand beginning 3 days ago. EXAM: CT HEAD WITHOUT CONTRAST TECHNIQUE: Contiguous axial images were obtained from  the base of the skull through the vertex without intravenous contrast. COMPARISON:  MRI 04/25/2015 FINDINGS: The brain has a normal appearance without evidence of malformation, atrophy, old or acute infarction, mass lesion, hemorrhage, hydrocephalus or extra-axial collection. The calvarium is unremarkable. The paranasal sinuses, middle ears and mastoids are clear. IMPRESSION: Normal head CT Electronically Signed   By: Nelson Chimes M.D.   On: 08/22/2015 18:37   Mr Brain Wo Contrast (neuro Protocol)  08/23/2015  CLINICAL DATA:  Intermittent RIGHT hand numbness and tingling beginning July 4th, new onset intermittent LEFT hand numbness and tingling. History of hypertension, hypercholesterolemia and cancer. EXAM: MRI HEAD WITHOUT CONTRAST MRI CERVICAL SPINE WITHOUT CONTRAST TECHNIQUE: Multiplanar, multiecho pulse sequences of the brain and surrounding structures, and cervical spine, to include the craniocervical junction and cervicothoracic junction, were obtained without intravenous contrast. COMPARISON:  CT HEAD August 22, 2015 at 18:27 and MRI of the head April 25, 2015 FINDINGS: MRI HEAD FINDINGS INTRACRANIAL CONTENTS: No reduced diffusion to suggest acute ischemia. No susceptibility artifact to suggest hemorrhage. The ventricles and sulci are normal for patient's age. A few scattered less  than 1 cm supratentorial white matter T2 hyperintensities most commonly seen with chronic small vessel ischemic disease are less than expected for age. No suspicious parenchymal signal, masses or mass effect. No abnormal extra-axial fluid collections. No extra-axial masses though, contrast enhanced sequences would be more sensitive. Normal major intracranial vascular flow voids present at skull base. ORBITS: The included ocular globes and orbital contents are non-suspicious. Status post bilateral ocular lens implants. SINUSES: Trace paranasal sinus mucosal thickening without air-fluid levels. Mastoid air cells are well aerated. SKULL/SOFT TISSUES: No abnormal sellar expansion. No suspicious calvarial bone marrow signal. Craniocervical junction maintained. Patient is edentulous. Muscle 1 cm LEFT frontal scalp lipoma. MRI CERVICAL SPINE FINDINGS ALIGNMENT: Maintenance of cervical lordosis.  No malalignment. VERTEBRAE/DISCS: Vertebral bodies are intact. Moderate C4-5 through C6-7 disc height loss, mild at C3-4 associated with moderate chronic discogenic endplate changes and mild disc desiccation. C3 inferior endplate Schmorl's node and acute discogenic endplate changes predominant toward the LEFT. CORD:Cervical spinal cord is normal morphology and signal characteristics from the cervicomedullary junction to level of T1-2, the most caudal well visualized level. POSTERIOR FOSSA, VERTEBRAL ARTERIES, PARASPINAL TISSUES: No MR findings of ligamentous injury. Vertebral artery flow voids present. Included posterior fossa and paraspinal soft tissues are nonsuspicious. Trace prevertebral effusion at C3. DISC LEVELS: C2-3: No disc bulge, canal stenosis nor neural foraminal narrowing. C3-4: 2 mm broad-based disc bulge, uncovertebral hypertrophy. Moderate canal stenosis. Moderate RIGHT and mild LEFT neural foraminal narrowing. C4 5: 2 mm broad-based disc bulge, uncovertebral hypertrophy. Mild facet arthropathy. Mild to moderate  canal stenosis. Moderate bilateral neural foraminal narrowing. C5-6: 2 mm broad-based disc bulge, uncovertebral hypertrophy. Mild to moderate canal stenosis. Mild to moderate bilateral neural foraminal narrowing. C6-7: 2 mm broad-based disc bulge, uncovertebral hypertrophy. Mild canal stenosis. Mild RIGHT, moderate LEFT neural foraminal narrowing. C7-T1: No disc bulge, canal stenosis nor neural foraminal narrowing. IMPRESSION: MRI BRAIN:  Normal for age. MRI CERVICAL SPINE:  No acute fracture or malalignment. Degenerative changes resulting in moderate canal stenosis C3-4, mild to moderate at C4-5 and C5-6. Neural foraminal narrowing C3-4 thru C6-7, moderate at multiple levels. Electronically Signed   By: Elon Alas M.D.   On: 08/23/2015 00:32   Mr Cervical Spine Wo Contrast  08/23/2015  CLINICAL DATA:  Intermittent RIGHT hand numbness and tingling beginning July 4th, new onset intermittent LEFT hand numbness and tingling. History  of hypertension, hypercholesterolemia and cancer. EXAM: MRI HEAD WITHOUT CONTRAST MRI CERVICAL SPINE WITHOUT CONTRAST TECHNIQUE: Multiplanar, multiecho pulse sequences of the brain and surrounding structures, and cervical spine, to include the craniocervical junction and cervicothoracic junction, were obtained without intravenous contrast. COMPARISON:  CT HEAD August 22, 2015 at 18:27 and MRI of the head April 25, 2015 FINDINGS: MRI HEAD FINDINGS INTRACRANIAL CONTENTS: No reduced diffusion to suggest acute ischemia. No susceptibility artifact to suggest hemorrhage. The ventricles and sulci are normal for patient's age. A few scattered less than 1 cm supratentorial white matter T2 hyperintensities most commonly seen with chronic small vessel ischemic disease are less than expected for age. No suspicious parenchymal signal, masses or mass effect. No abnormal extra-axial fluid collections. No extra-axial masses though, contrast enhanced sequences would be more sensitive. Normal major  intracranial vascular flow voids present at skull base. ORBITS: The included ocular globes and orbital contents are non-suspicious. Status post bilateral ocular lens implants. SINUSES: Trace paranasal sinus mucosal thickening without air-fluid levels. Mastoid air cells are well aerated. SKULL/SOFT TISSUES: No abnormal sellar expansion. No suspicious calvarial bone marrow signal. Craniocervical junction maintained. Patient is edentulous. Muscle 1 cm LEFT frontal scalp lipoma. MRI CERVICAL SPINE FINDINGS ALIGNMENT: Maintenance of cervical lordosis.  No malalignment. VERTEBRAE/DISCS: Vertebral bodies are intact. Moderate C4-5 through C6-7 disc height loss, mild at C3-4 associated with moderate chronic discogenic endplate changes and mild disc desiccation. C3 inferior endplate Schmorl's node and acute discogenic endplate changes predominant toward the LEFT. CORD:Cervical spinal cord is normal morphology and signal characteristics from the cervicomedullary junction to level of T1-2, the most caudal well visualized level. POSTERIOR FOSSA, VERTEBRAL ARTERIES, PARASPINAL TISSUES: No MR findings of ligamentous injury. Vertebral artery flow voids present. Included posterior fossa and paraspinal soft tissues are nonsuspicious. Trace prevertebral effusion at C3. DISC LEVELS: C2-3: No disc bulge, canal stenosis nor neural foraminal narrowing. C3-4: 2 mm broad-based disc bulge, uncovertebral hypertrophy. Moderate canal stenosis. Moderate RIGHT and mild LEFT neural foraminal narrowing. C4 5: 2 mm broad-based disc bulge, uncovertebral hypertrophy. Mild facet arthropathy. Mild to moderate canal stenosis. Moderate bilateral neural foraminal narrowing. C5-6: 2 mm broad-based disc bulge, uncovertebral hypertrophy. Mild to moderate canal stenosis. Mild to moderate bilateral neural foraminal narrowing. C6-7: 2 mm broad-based disc bulge, uncovertebral hypertrophy. Mild canal stenosis. Mild RIGHT, moderate LEFT neural foraminal narrowing.  C7-T1: No disc bulge, canal stenosis nor neural foraminal narrowing. IMPRESSION: MRI BRAIN:  Normal for age. MRI CERVICAL SPINE:  No acute fracture or malalignment. Degenerative changes resulting in moderate canal stenosis C3-4, mild to moderate at C4-5 and C5-6. Neural foraminal narrowing C3-4 thru C6-7, moderate at multiple levels. Electronically Signed   By: Elon Alas M.D.   On: 08/23/2015 00:32      Merryl Hacker, MD 08/23/15 [phone]

## 2015-08-23 NOTE — Discharge Instructions (Signed)
Your MRI is mostly reassuring.  You do have some mild cervical spine stenosis which can cause numbness and nerve symptoms in the arms. Neurosurgery follow-up was provided if her symptoms worsen.  Paresthesia Paresthesia is an abnormal burning or prickling sensation. This sensation is generally felt in the hands, arms, legs, or feet. However, it may occur in any part of the body. Usually, it is not painful. The feeling may be described as:  Tingling or numbness.  Pins and needles.  Skin crawling.  Buzzing.  Limbs falling asleep.  Itching. Most people experience temporary (transient) paresthesia at some time in their lives. Paresthesia may occur when you breathe too quickly (hyperventilation). It can also occur without any apparent cause. Commonly, paresthesia occurs when pressure is placed on a nerve. The sensation quickly goes away after the pressure is removed. For some people, however, paresthesia is a long-lasting (chronic) condition that is caused by an underlying disorder. If you continue to have paresthesia, you may need further medical evaluation. HOME CARE INSTRUCTIONS Watch your condition for any changes. Taking the following actions may help to lessen any discomfort that you are feeling:  Avoid drinking alcohol.  Try acupuncture or massage to help relieve your symptoms.  Keep all follow-up visits as directed by your health care provider. This is important. SEEK MEDICAL CARE IF:  You continue to have episodes of paresthesia.  Your burning or prickling feeling gets worse when you walk.  You have pain, cramps, or dizziness.  You develop a rash. SEEK IMMEDIATE MEDICAL CARE IF:  You feel weak.  You have trouble walking or moving.  You have problems with speech, understanding, or vision.  You feel confused.  You cannot control your bladder or bowel movements.  You have numbness after an injury.  You faint.   This information is not intended to replace advice  given to you by your health care provider. Make sure you discuss any questions you have with your health care provider.   Document Released: 01/22/2002 Document Revised: 06/18/2014 Document Reviewed: 01/28/2014 Elsevier Interactive Patient Education [employer].

## 2015-08-27 ENCOUNTER — Telehealth: Payer: Self-pay | Admitting: Internal Medicine

## 2015-08-27 ENCOUNTER — Ambulatory Visit: Payer: Commercial Managed Care - HMO | Admitting: Internal Medicine

## 2015-08-27 DIAGNOSIS — I10 Essential (primary) hypertension: Secondary | ICD-10-CM

## 2015-08-27 NOTE — Telephone Encounter (Signed)
Pt has lost her 90 day supply of blood pressure pills.  She only has 7 pills left. She thinks she lost it on the bus.  The prescription is HCTZ.    She uses Paediatric nurse at [facility]. Please advise

## 2015-08-28 MED ORDER — HYDROCHLOROTHIAZIDE 25 MG PO TABS: 25.0000 mg | ORAL_TABLET | Freq: Every day | ORAL | Status: DC

## 2015-08-28 NOTE — Telephone Encounter (Signed)
I have sent in another 90 day supply of HCTZ to [facility] due to her lost Rx.   Phill Myron, D.O. 08/28/2015, 9:35 AM PGY-2, [place]

## 2015-08-28 NOTE — Telephone Encounter (Signed)
Tried to call patient and inform her that script was sent to pharmacy but there was no answer or VM. Jazmin Hartsell,CMA

## 2015-09-03 ENCOUNTER — Encounter: Payer: Self-pay | Admitting: Family Medicine

## 2015-09-03 ENCOUNTER — Ambulatory Visit (INDEPENDENT_AMBULATORY_CARE_PROVIDER_SITE_OTHER): Payer: Commercial Managed Care - HMO | Admitting: Family Medicine

## 2015-09-03 VITALS — BP 129/69 | HR 57 | Temp 97.8°F | Wt 182.0 lb

## 2015-09-03 DIAGNOSIS — M4802 Spinal stenosis, cervical region: Secondary | ICD-10-CM

## 2015-09-03 DIAGNOSIS — L989 Disorder of the skin and subcutaneous tissue, unspecified: Secondary | ICD-10-CM | POA: Diagnosis not present

## 2015-09-03 DIAGNOSIS — L821 Other seborrheic keratosis: Secondary | ICD-10-CM | POA: Insufficient documentation

## 2015-09-03 HISTORY — DX: Other seborrheic keratosis: L82.1

## 2015-09-03 HISTORY — DX: Spinal stenosis, cervical region: M48.02

## 2015-09-03 NOTE — Patient Instructions (Signed)
I have placed a referral to Neurosurgery for you.  Schedule an appointment with [facility]  They can biopsy that site for you.

## 2015-09-03 NOTE — Progress Notes (Signed)
Subjective: CC: ED follow up for UE numbness HPI: Susan Ingram is a 75 y.o. female presenting to clinic today for ED follow up. Concerns today include:  1.R UE numbness Patient seen in ED about 1 week ago for UE numbness.  She had an MRI performed that showed cervical canal stenosis but no cord enhancement.  She reports that the numbness continues to be intermittent.  No weakness.  She reports tingling pain that starts in the fingers and travels up to her neck.  She reports neck pain that has been chronic.  She was instructed to see [place] to get a referral to ortho.  2. Spot right leg She notes that she first saw the spot several years ago.  No preceding injury.  She notes that it got bigger about 1 year ago.  She notes that it appears lighter in color to her lately.  She denies family history of skin cancer.  Denies unplanned weight loss, fevers, chills.  She has never had it biopsied.    Social History Reviewed FamHx and MedHx reviewed.  Please see EMR.  ROS: Per HPI  Objective: Office vital signs reviewed. BP 129/69 mmHg  Pulse 57  Temp(Src) 97.8 F (36.6 C) (Oral)  Wt 182 lb (82.555 kg)  SpO2 98%  Physical Examination:  General: Awake, alert, well nourished, No acute distress Extremities: warm, well perfused, No edema, cyanosis or clubbing; +2 radial pulses bilaterally MSK: Normal gait and station Skin: dry, intact, right medial ankle with 2.25cm x 2.5 cm flat, light brown, irregularly shaped lesion w/ very small area of hyperpigmentation Neuro: light touch sensation in tact b/l UE, 5/5 grip strength, negative Spurling's  Ct Head Wo Contrast  08/22/2015  CLINICAL DATA:  Tingling of the right face and hand beginning 3 days ago. EXAM: CT HEAD WITHOUT CONTRAST TECHNIQUE: Contiguous axial images were obtained from the base of the skull through the vertex without intravenous contrast. COMPARISON:  MRI 04/25/2015 FINDINGS: The brain has a normal appearance without evidence of  malformation, atrophy, old or acute infarction, mass lesion, hemorrhage, hydrocephalus or extra-axial collection. The calvarium is unremarkable. The paranasal sinuses, middle ears and mastoids are clear. IMPRESSION: Normal head CT Electronically Signed   By: Nelson Chimes M.D.   On: 08/22/2015 18:37   Mr Brain Wo Contrast (neuro Protocol)  08/23/2015  CLINICAL DATA:  Intermittent RIGHT hand numbness and tingling beginning July 4th, new onset intermittent LEFT hand numbness and tingling. History of hypertension, hypercholesterolemia and cancer. EXAM: MRI HEAD WITHOUT CONTRAST MRI CERVICAL SPINE WITHOUT CONTRAST TECHNIQUE: Multiplanar, multiecho pulse sequences of the brain and surrounding structures, and cervical spine, to include the craniocervical junction and cervicothoracic junction, were obtained without intravenous contrast. COMPARISON:  CT HEAD August 22, 2015 at 18:27 and MRI of the head April 25, 2015 FINDINGS: MRI HEAD FINDINGS INTRACRANIAL CONTENTS: No reduced diffusion to suggest acute ischemia. No susceptibility artifact to suggest hemorrhage. The ventricles and sulci are normal for patient's age. A few scattered less than 1 cm supratentorial white matter T2 hyperintensities most commonly seen with chronic small vessel ischemic disease are less than expected for age. No suspicious parenchymal signal, masses or mass effect. No abnormal extra-axial fluid collections. No extra-axial masses though, contrast enhanced sequences would be more sensitive. Normal major intracranial vascular flow voids present at skull base. ORBITS: The included ocular globes and orbital contents are non-suspicious. Status post bilateral ocular lens implants. SINUSES: Trace paranasal sinus mucosal thickening without air-fluid levels. Mastoid air cells  are well aerated. SKULL/SOFT TISSUES: No abnormal sellar expansion. No suspicious calvarial bone marrow signal. Craniocervical junction maintained. Patient is edentulous. Muscle 1 cm  LEFT frontal scalp lipoma. MRI CERVICAL SPINE FINDINGS ALIGNMENT: Maintenance of cervical lordosis.  No malalignment. VERTEBRAE/DISCS: Vertebral bodies are intact. Moderate C4-5 through C6-7 disc height loss, mild at C3-4 associated with moderate chronic discogenic endplate changes and mild disc desiccation. C3 inferior endplate Schmorl's node and acute discogenic endplate changes predominant toward the LEFT. CORD:Cervical spinal cord is normal morphology and signal characteristics from the cervicomedullary junction to level of T1-2, the most caudal well visualized level. POSTERIOR FOSSA, VERTEBRAL ARTERIES, PARASPINAL TISSUES: No MR findings of ligamentous injury. Vertebral artery flow voids present. Included posterior fossa and paraspinal soft tissues are nonsuspicious. Trace prevertebral effusion at C3. DISC LEVELS: C2-3: No disc bulge, canal stenosis nor neural foraminal narrowing. C3-4: 2 mm broad-based disc bulge, uncovertebral hypertrophy. Moderate canal stenosis. Moderate RIGHT and mild LEFT neural foraminal narrowing. C4 5: 2 mm broad-based disc bulge, uncovertebral hypertrophy. Mild facet arthropathy. Mild to moderate canal stenosis. Moderate bilateral neural foraminal narrowing. C5-6: 2 mm broad-based disc bulge, uncovertebral hypertrophy. Mild to moderate canal stenosis. Mild to moderate bilateral neural foraminal narrowing. C6-7: 2 mm broad-based disc bulge, uncovertebral hypertrophy. Mild canal stenosis. Mild RIGHT, moderate LEFT neural foraminal narrowing. C7-T1: No disc bulge, canal stenosis nor neural foraminal narrowing. IMPRESSION: MRI BRAIN:  Normal for age. MRI CERVICAL SPINE:  No acute fracture or malalignment. Degenerative changes resulting in moderate canal stenosis C3-4, mild to moderate at C4-5 and C5-6. Neural foraminal narrowing C3-4 thru C6-7, moderate at multiple levels. Electronically Signed   By: Elon Alas M.D.   On: 08/23/2015 00:32   Mr Cervical Spine Wo  Contrast  08/23/2015  CLINICAL DATA:  Intermittent RIGHT hand numbness and tingling beginning July 4th, new onset intermittent LEFT hand numbness and tingling. History of hypertension, hypercholesterolemia and cancer. EXAM: MRI HEAD WITHOUT CONTRAST MRI CERVICAL SPINE WITHOUT CONTRAST TECHNIQUE: Multiplanar, multiecho pulse sequences of the brain and surrounding structures, and cervical spine, to include the craniocervical junction and cervicothoracic junction, were obtained without intravenous contrast. COMPARISON:  CT HEAD August 22, 2015 at 18:27 and MRI of the head April 25, 2015 FINDINGS: MRI HEAD FINDINGS INTRACRANIAL CONTENTS: No reduced diffusion to suggest acute ischemia. No susceptibility artifact to suggest hemorrhage. The ventricles and sulci are normal for patient's age. A few scattered less than 1 cm supratentorial white matter T2 hyperintensities most commonly seen with chronic small vessel ischemic disease are less than expected for age. No suspicious parenchymal signal, masses or mass effect. No abnormal extra-axial fluid collections. No extra-axial masses though, contrast enhanced sequences would be more sensitive. Normal major intracranial vascular flow voids present at skull base. ORBITS: The included ocular globes and orbital contents are non-suspicious. Status post bilateral ocular lens implants. SINUSES: Trace paranasal sinus mucosal thickening without air-fluid levels. Mastoid air cells are well aerated. SKULL/SOFT TISSUES: No abnormal sellar expansion. No suspicious calvarial bone marrow signal. Craniocervical junction maintained. Patient is edentulous. Muscle 1 cm LEFT frontal scalp lipoma. MRI CERVICAL SPINE FINDINGS ALIGNMENT: Maintenance of cervical lordosis.  No malalignment. VERTEBRAE/DISCS: Vertebral bodies are intact. Moderate C4-5 through C6-7 disc height loss, mild at C3-4 associated with moderate chronic discogenic endplate changes and mild disc desiccation. C3 inferior endplate  Schmorl's node and acute discogenic endplate changes predominant toward the LEFT. CORD:Cervical spinal cord is normal morphology and signal characteristics from the cervicomedullary junction to level of T1-2, the most caudal  well visualized level. POSTERIOR FOSSA, VERTEBRAL ARTERIES, PARASPINAL TISSUES: No MR findings of ligamentous injury. Vertebral artery flow voids present. Included posterior fossa and paraspinal soft tissues are nonsuspicious. Trace prevertebral effusion at C3. DISC LEVELS: C2-3: No disc bulge, canal stenosis nor neural foraminal narrowing. C3-4: 2 mm broad-based disc bulge, uncovertebral hypertrophy. Moderate canal stenosis. Moderate RIGHT and mild LEFT neural foraminal narrowing. C4 5: 2 mm broad-based disc bulge, uncovertebral hypertrophy. Mild facet arthropathy. Mild to moderate canal stenosis. Moderate bilateral neural foraminal narrowing. C5-6: 2 mm broad-based disc bulge, uncovertebral hypertrophy. Mild to moderate canal stenosis. Mild to moderate bilateral neural foraminal narrowing. C6-7: 2 mm broad-based disc bulge, uncovertebral hypertrophy. Mild canal stenosis. Mild RIGHT, moderate LEFT neural foraminal narrowing. C7-T1: No disc bulge, canal stenosis nor neural foraminal narrowing. IMPRESSION: MRI BRAIN:  Normal for age. MRI CERVICAL SPINE:  No acute fracture or malalignment. Degenerative changes resulting in moderate canal stenosis C3-4, mild to moderate at C4-5 and C5-6. Neural foraminal narrowing C3-4 thru C6-7, moderate at multiple levels. Electronically Signed   By: Elon Alas M.D.   On: 08/23/2015 00:32    Assessment/ Plan: 75 y.o. female   1. Cervical stenosis of spinal canal. Seen on MRI earlier this month.  CT and MRI reports reviewed.  ED notes reviewed.  Continues to have intermittent numbness in RUE.  She wishes to discuss options with neurosurgery. - Patient to call as well to schedule appt - Ambulatory referral to Neurosurgery - Return precautions  reviewed  2. Skin lesion of right leg, borders are irregular.  Small hyperpigmentation in center.  Given increasing size and recent color change, recommend biopsy of this pigmented lesion.  No B symptoms. - Recommend scheduling appt with Derm clinic to have biopsy performed - If unable to do in next 2 weeks, recommend scheduling with PCP to have biopsied  Janora Norlander, DO PGY-3, [facility]

## 2015-09-17 ENCOUNTER — Other Ambulatory Visit: Payer: Self-pay | Admitting: Internal Medicine

## 2015-10-01 ENCOUNTER — Ambulatory Visit (INDEPENDENT_AMBULATORY_CARE_PROVIDER_SITE_OTHER): Payer: Commercial Managed Care - HMO | Admitting: Family Medicine

## 2015-10-01 VITALS — BP 133/67 | HR 70 | Temp 98.1°F | Ht 66.0 in | Wt 183.2 lb

## 2015-10-01 DIAGNOSIS — R739 Hyperglycemia, unspecified: Secondary | ICD-10-CM | POA: Diagnosis not present

## 2015-10-01 DIAGNOSIS — D489 Neoplasm of uncertain behavior, unspecified: Secondary | ICD-10-CM | POA: Diagnosis not present

## 2015-10-01 LAB — POCT GLYCOSYLATED HEMOGLOBIN (HGB A1C): Hemoglobin A1C: 6.3

## 2015-10-01 NOTE — Assessment & Plan Note (Signed)
23 mm by 34 mm hyperpigmented lesion of right medial leg.  -biopsy of lesion completed

## 2015-10-01 NOTE — Patient Instructions (Signed)
It was nice to meet you today.  Skin lesion - Dr. Ree Kida will call you with the results   Elevated blood sugar - we will check you A1C today, Dr. Juleen China will receive the results.

## 2015-10-01 NOTE — Progress Notes (Signed)
   Subjective:    Patient ID: Susan Ingram, female    DOB: 01-Jun-1940, 75 y.o.   MRN: [id number]  HPI 75 y/o female presents for evaluation of skin lesion.  Lesion - right ankle/distal shin, present for one year, has increased in size, no itching, no redness, no swelling   Review of Systems No fevers/chills/fatigue/nausea/emesis    Objective:   Physical Exam BP 133/67   Pulse 70   Temp 98.1 F (36.7 C) (Oral)   Ht '5\' 6"'$  (1.676 m)   Wt 183 lb 3.2 oz (83.1 kg)   BMI 29.57 kg/m   Skin: right medial ankle - 23 mm by 34 mm hyperpigmented patch right medial leg.     Procedure Note: Punch Biopsy Written and Verbal Consent obtained. Risks and benefits of procedure discussed with patient. Area prepped in sterile fashion. Using a 5 cc syringe and 25 gauge 1.5 inch needle approximately 1 cc of 1% Lidocaine with Epinephrine was instilled into the lesion. A 3 mm punch was performed. Using pickups and iris scissors the biopsy specimen was removed. Hemostasis was achieved. No complications. Band-aid applied. Specimen sent for pathology.      Assessment & Plan:  Neoplasm of uncertain behavior 23 mm by 34 mm hyperpigmented lesion of right medial leg.  -biopsy of lesion completed

## 2015-10-03 ENCOUNTER — Telehealth: Payer: Self-pay | Admitting: Family Medicine

## 2015-10-03 NOTE — Telephone Encounter (Signed)
Attempted to contact patient with biopsy results, no answer.

## 2015-10-06 ENCOUNTER — Encounter: Payer: Self-pay | Admitting: Family Medicine

## 2015-10-06 NOTE — Telephone Encounter (Signed)
Attempted to contact patient again, no answer, will send letter.

## 2015-10-09 ENCOUNTER — Telehealth: Payer: Self-pay | Admitting: Internal Medicine

## 2015-12-01 ENCOUNTER — Ambulatory Visit (INDEPENDENT_AMBULATORY_CARE_PROVIDER_SITE_OTHER): Payer: Commercial Managed Care - HMO | Admitting: *Deleted

## 2015-12-01 DIAGNOSIS — Z23 Encounter for immunization: Secondary | ICD-10-CM

## 2015-12-05 ENCOUNTER — Other Ambulatory Visit: Payer: Self-pay | Admitting: Internal Medicine

## 2015-12-05 DIAGNOSIS — I10 Essential (primary) hypertension: Secondary | ICD-10-CM

## 2015-12-05 NOTE — Telephone Encounter (Signed)
Pt states she needs a refill on everything. Pt uses [facility]. Please advise. Thanks! ep

## 2015-12-09 MED ORDER — FLUTICASONE PROPIONATE 50 MCG/ACT NA SUSP: 2.0000 | Freq: Every day | NASAL | 6 refills | Status: DC

## 2015-12-09 MED ORDER — ALBUTEROL SULFATE HFA 108 (90 BASE) MCG/ACT IN AERS: 2.0000 | INHALATION_SPRAY | Freq: Four times a day (QID) | RESPIRATORY_TRACT | 2 refills | Status: DC | PRN

## 2015-12-09 MED ORDER — HYDROCHLOROTHIAZIDE 25 MG PO TABS: 25.0000 mg | ORAL_TABLET | Freq: Every day | ORAL | 3 refills | Status: DC

## 2015-12-09 MED ORDER — RANITIDINE HCL 150 MG PO TABS: 150.0000 mg | ORAL_TABLET | Freq: Every day | ORAL | 11 refills | Status: DC

## 2015-12-09 MED ORDER — CETIRIZINE HCL 10 MG PO TABS: 10.0000 mg | ORAL_TABLET | Freq: Every day | ORAL | 11 refills | Status: DC

## 2015-12-09 MED ORDER — ASPIRIN EC 81 MG PO TBEC: 81.0000 mg | DELAYED_RELEASE_TABLET | Freq: Every day | ORAL | 3 refills | Status: DC

## 2015-12-09 MED ORDER — LISINOPRIL 20 MG PO TABS: 20.0000 mg | ORAL_TABLET | Freq: Every day | ORAL | 3 refills | Status: DC

## 2015-12-09 MED ORDER — ATORVASTATIN CALCIUM 40 MG PO TABS: 40.0000 mg | ORAL_TABLET | Freq: Every day | ORAL | 3 refills | Status: DC

## 2015-12-09 NOTE — Telephone Encounter (Signed)
I have refilled the appropriate prescriptions.   Phill Myron, D.O. 12/09/2015, 10:49 AM PGY-2, [place]

## 2016-02-11 ENCOUNTER — Encounter: Payer: Self-pay | Admitting: Licensed Clinical Social Worker

## 2016-02-11 ENCOUNTER — Encounter: Payer: Self-pay | Admitting: Internal Medicine

## 2016-02-11 ENCOUNTER — Ambulatory Visit (INDEPENDENT_AMBULATORY_CARE_PROVIDER_SITE_OTHER): Payer: Commercial Managed Care - HMO | Admitting: Internal Medicine

## 2016-02-11 VITALS — BP 138/68 | HR 71 | Temp 98.4°F | Ht 66.0 in | Wt 186.4 lb

## 2016-02-11 DIAGNOSIS — I10 Essential (primary) hypertension: Secondary | ICD-10-CM

## 2016-02-11 DIAGNOSIS — E785 Hyperlipidemia, unspecified: Secondary | ICD-10-CM

## 2016-02-11 DIAGNOSIS — R7303 Prediabetes: Secondary | ICD-10-CM | POA: Diagnosis not present

## 2016-02-11 DIAGNOSIS — F329 Major depressive disorder, single episode, unspecified: Secondary | ICD-10-CM

## 2016-02-11 DIAGNOSIS — R4589 Other symptoms and signs involving emotional state: Secondary | ICD-10-CM

## 2016-02-11 DIAGNOSIS — E118 Type 2 diabetes mellitus with unspecified complications: Secondary | ICD-10-CM

## 2016-02-11 DIAGNOSIS — F339 Major depressive disorder, recurrent, unspecified: Secondary | ICD-10-CM | POA: Insufficient documentation

## 2016-02-11 LAB — BASIC METABOLIC PANEL WITH GFR
BUN: 16 mg/dL (ref 7–25)
CO2: 29 mmol/L (ref 20–31)
Calcium: 9.5 mg/dL (ref 8.6–10.4)
Chloride: 103 mmol/L (ref 98–110)
Creat: 0.82 mg/dL (ref 0.60–0.93)
GFR, Est African American: 80 mL/min (ref >=60)
GFR, Est Non African American: 70 mL/min (ref >=60)
Glucose, Bld: 142 mg/dL — ABNORMAL HIGH (ref 65–99)
Potassium: 3.6 mmol/L (ref 3.5–5.3)
Sodium: 141 mmol/L (ref 135–146)

## 2016-02-11 LAB — LIPID PANEL
Cholesterol: 168 mg/dL (ref <200)
HDL: 40 mg/dL — ABNORMAL LOW (ref >50)
LDL Cholesterol: 94 mg/dL (ref <100)
Total CHOL/HDL Ratio: 4.2 Ratio (ref <5.0)
Triglycerides: 169 mg/dL — ABNORMAL HIGH (ref <150)
VLDL: 34 mg/dL — ABNORMAL HIGH (ref <30)

## 2016-02-11 LAB — POCT GLYCOSYLATED HEMOGLOBIN (HGB A1C): Hemoglobin A1C: 6.4

## 2016-02-11 NOTE — Assessment & Plan Note (Signed)
Well controlled. Continue Lisinopril and HCTZ. Will check BMET today. Noted that on previous BMET potassium has been mildly low at 3.3. May benefit from K supplement if low on recheck.

## 2016-02-11 NOTE — Assessment & Plan Note (Addendum)
Secondary to life stressors. PHQ-9 score of 5 is not alarming and patient denies SI. CSW Casimer Lanius met with patient and provided emotional support, plan for coping, and resources. Patient to call Casimer Lanius prn for further support. Please see Moore's note from 12/27 for further information.

## 2016-02-11 NOTE — Progress Notes (Signed)
Subjective:    Susan Ingram - 75 y.o. female MRN [id number]  Date of birth: 1940/08/20  HPI  Susan Ingram is here for an annual exam.  HTN: Patient reports compliance with her lisinopril and HCTZ. She does not frequently monitor her BP at home. Denies chest pain, lower extremity edema, and severe headaches. Endorses vision changes but reports these are chronic after her cataract surgery in June. She has follow up with her eye doctor in 2 weeks.   Elevated Glucose: Patient has been in pre-diabetic category on several checks of A1C and did have A1C elevated to 6.5 in 2016. She has never been on diabetic medications. We have discussed lifestyle changes in the past and patient admits to not trying any diet changes yet. She eats a lot of white rice, bread, and potatoes. She also enjoys soda. She does not exercise.   Depressed Mood: Patient endorses feeling down on and off for the past several months after losing her job. These feelings worsened around the holiday season because she wasn't able to give gifts to her grandchildren like she normally would. She is also worried about paying bills. She collects a little bit of social security money but says it is not enough. She has struggled to find another job due to her age. However, she finds strength in God and her family. PHQ-9 score of 5. Denies SI and HI. Interested in talking with integrated care.   ROS: Patient reports vision changes and stress. No hearing changes,anorexia, weight change, fever ,adenopathy, persistant / recurrent hoarseness, swallowing issues, chest pain, edema,persistant / recurrent cough, hemoptysis, dyspnea(rest, exertional, paroxysmal nocturnal), gastrointestinal  bleeding (melena, rectal bleeding), abdominal pain, excessive heart burn, GU symptoms(dysuria, hematuria, pyuria, voiding/incontinence  Issues) syncope, focal weakness, severe memory loss, concerning skin lesions, anxiety, abnormal bruising/bleeding, major  joint swelling, breast masses or abnormal vaginal bleeding.    -  reports that she quit smoking about 22 years ago. She does not have any smokeless tobacco history on file. - Review of Systems: Per HPI. - Past Medical History: Patient Active Problem List   Diagnosis Date Noted  . Depressed mood 02/11/2016  . Neoplasm of uncertain behavior 18/29/9371  . Skin lesion of right leg 09/03/2015  . Cervical stenosis of spinal canal 09/03/2015  . Prediabetes 04/22/2015  . Headache 04/22/2015  . 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 Allergic rhinitis 04/22/2015  . Ear discomfort 01/03/2015  . Helicobacter pylori gastritis 10/08/2014  . Intermittent dysphagia 08/14/2014  . Heme positive stool 07/24/2014  . Heartburn 06/18/2014  . Knee pain, right 04/09/2014  . Preventative health care 01/03/2014  . Hyperlipidemia 01/02/2014  . Hypertension 02/18/2012  . Asthma 02/18/2012  . History of vitamin D deficiency 02/18/2012  . History of lung cancer 11/24/2010   - Medications: reviewed and updated   Objective:   Physical Exam BP 138/68   Pulse 71   Temp 98.4 F (36.9 C) (Oral)   Ht '5\' 6"'  (1.676 m)   Wt 186 lb 6.4 oz (84.6 kg)   SpO2 99%   BMI 30.09 kg/m  Gen: NAD, alert, cooperative with exam, well-appearing HEENT: NCAT, PERRL, clear conjunctiva, oropharynx clear, supple neck CV: RRR, good S1/S2, no murmur, no edema, capillary refill brisk  Resp: CTABL, no wheezes, non-labored Abd: SNTND, BS present, no guarding or organomegaly Skin: no rashes, normal turgor  Neuro: no gross deficits.  Psych: good insight, alert and oriented, appropriate affect   Assessment & Plan:   Hypertension Well controlled.  Continue Lisinopril and HCTZ. Will check BMET today. Noted that on previous BMET potassium has been mildly low at 3.3. May benefit from K supplement if low on recheck.   Prediabetes Patient did have A1C of 6.5 in Nov 2016 meeting diagnostic criteria for diabetes. However, subsequent A1Cs  have been <6.5. A1C today is 6.4. Discussed this with patient. Discussed medication therapy vs. Lifestyle changes. Patient wishes to attempt lifestyle changes as she admits she has previously not attempted any. Printout of diabetic diet given to patient and discussed decreasing carb intake. Also discussed starting walking regimen. Patient agreeable. Will follow up in 3-4 months for repeat A1C.   Depressed mood Secondary to life stressors. PHQ-9 score of 5 is not alarming and patient denies SI. CSW Susan Ingram met with patient and provided emotional support, plan for coping, and resources. Patient to call Susan Ingram prn for further support. Please see Moore's note from 12/27 for further information.     Susan Ingram, D.O. 02/11/2016, 10:04 AM PGY-2, [place]

## 2016-02-11 NOTE — Patient Instructions (Addendum)
Your blood sugar is borderline for diabetes and you have what is called pre-diabetes. Please read the diet recommendations below and work on decreasing sugary/starchy foods such as soda, candy, desserts, pasta, potatoes, bread and rice from your diet. Try starting a walking program. You can start with as little as 30 minutes 3-4 days per week and increase from there.    Diabetes Mellitus and Food It is important for you to manage your blood sugar (glucose) level. Your blood glucose level can be greatly affected by what you eat. Eating healthier foods in the appropriate amounts throughout the day at about the same time each day will help you control your blood glucose level. It can also help slow or prevent worsening of your diabetes mellitus. Healthy eating may even help you improve the level of your blood pressure and reach or maintain a healthy weight. General recommendations for healthful eating and cooking habits include:  Eating meals and snacks regularly. Avoid going long periods of time without eating to lose weight.  Eating a diet that consists mainly of plant-based foods, such as fruits, vegetables, nuts, legumes, and whole grains.  Using low-heat cooking methods, such as baking, instead of high-heat cooking methods, such as deep frying. Work with your dietitian to make sure you understand how to use the Nutrition Facts information on food labels. How can food affect me? Carbohydrates  Carbohydrates affect your blood glucose level more than any other type of food. Your dietitian will help you determine how many carbohydrates to eat at each meal and teach you how to count carbohydrates. Counting carbohydrates is important to keep your blood glucose at a healthy level, especially if you are using insulin or taking certain medicines for diabetes mellitus. Alcohol  Alcohol can cause sudden decreases in blood glucose (hypoglycemia), especially if you use insulin or take certain medicines for  diabetes mellitus. Hypoglycemia can be a life-threatening condition. Symptoms of hypoglycemia (sleepiness, dizziness, and disorientation) are similar to symptoms of having too much alcohol. If your health care provider has given you approval to drink alcohol, do so in moderation and use the following guidelines:  Women should not have more than one drink per day, and men should not have more than two drinks per day. One drink is equal to:  12 oz of beer.  5 oz of wine.  1 oz of hard liquor.  Do not drink on an empty stomach.  Keep yourself hydrated. Have water, diet soda, or unsweetened iced tea.  Regular soda, juice, and other mixers might contain a lot of carbohydrates and should be counted. What foods are not recommended? As you make food choices, it is important to remember that all foods are not the same. Some foods have fewer nutrients per serving than other foods, even though they might have the same number of calories or carbohydrates. It is difficult to get your body what it needs when you eat foods with fewer nutrients. Examples of foods that you should avoid that are high in calories and carbohydrates but low in nutrients include:  Trans fats (most processed foods list trans fats on the Nutrition Facts label).  Regular soda.  Juice.  Candy.  Sweets, such as cake, pie, doughnuts, and cookies.  Fried foods. What foods can I eat? Eat nutrient-rich foods, which will nourish your body and keep you healthy. The food you should eat also will depend on several factors, including:  The calories you need.  The medicines you take.  Your weight.  Your blood glucose level.  Your blood pressure level.  Your cholesterol level. You should eat a variety of foods, including:  Protein.  Lean cuts of meat.  Proteins low in saturated fats, such as fish, egg whites, and beans. Avoid processed meats.  Fruits and vegetables.  Fruits and vegetables that may help control blood  glucose levels, such as apples, mangoes, and yams.  Dairy products.  Choose fat-free or low-fat dairy products, such as milk, yogurt, and cheese.  Grains, bread, pasta, and rice.  Choose whole grain products, such as multigrain bread, whole oats, and brown rice. These foods may help control blood pressure.  Fats.  Foods containing healthful fats, such as nuts, avocado, olive oil, canola oil, and fish. Does everyone with diabetes mellitus have the same meal plan? Because every person with diabetes mellitus is different, there is not one meal plan that works for everyone. It is very important that you meet with a dietitian who will help you create a meal plan that is just right for you. This information is not intended to replace advice given to you by your health care provider. Make sure you discuss any questions you have with your health care provider. Document Released: 10/29/2004 Document Revised: 07/10/2015 Document Reviewed: 12/29/2012 Elsevier Interactive Patient Education  2017 [employer].

## 2016-02-11 NOTE — Progress Notes (Signed)
Warm handoff   SUBJECTIVE: Susan Ingram is a 75 y.o. female  referred by Dr. Juleen China for:  feeling down. Discussed services offered by [facility], patient appreciative of support offered, verbal consent received. Patient appreciative of talking with LCSW  Assessments administered:  Depression screen [facility] 2/9 02/11/2016 09/03/2015 02/07/2015  Decreased Interest 3 0 0  Down, Depressed, Hopeless 3 0 0  PHQ - 2 Score 6 0 0    LIFE CONTEXT:  Family & Social: patient lives alone has family support of daughter and grandson  Higher education careers adviser Work: patient is looking from employment, receives [employer] and unemployment. Unemployment will run out the end of Jan.   Life changes: Patient was laid off of her job working at [employer] after 16 years.  Experience financial stressors What is important to pt (values): working is important to patient and being able to remain in her apartment of 21 years.   THE FOLLOW WAS DISCUSSED:Current stressors, things she has done and resources she has explored for job search.     GOALS ADDRESSED:  1. Finding a new job  ASSESSMENT:  Pt currently feeling down. Symptoms exacerbated by psychosocial stressors of loosing job .  Patient is very motivated to find employment and needs help with completing applications on the computer.  Modalities utilized: Solution Focused and Reframing  :Engineer, maintenance (IT), [employer] job training) and Pension scheme manager     PLAN: 1.Patient will follow up on resources provided  2. Patient will contact LCSW if additional information or resources are needed. 3. On a scale of 1-10, how likely are you to follow plan: 10.    Casimer Lanius, LCSW Licensed Clinical Social Worker [place]   [phone] 10:08 AM

## 2016-02-11 NOTE — Assessment & Plan Note (Signed)
Patient did have A1C of 6.5 in Nov 2016 meeting diagnostic criteria for diabetes. However, subsequent A1Cs have been <6.5. A1C today is 6.4. Discussed this with patient. Discussed medication therapy vs. Lifestyle changes. Patient wishes to attempt lifestyle changes as she admits she has previously not attempted any. Printout of diabetic diet given to patient and discussed decreasing carb intake. Also discussed starting walking regimen. Patient agreeable. Will follow up in 3-4 months for repeat A1C.

## 2016-03-10 ENCOUNTER — Other Ambulatory Visit: Payer: Self-pay | Admitting: Internal Medicine

## 2016-03-10 MED ORDER — FLUTICASONE PROPIONATE 50 MCG/ACT NA SUSP: 2.0000 | Freq: Every day | NASAL | 6 refills | Status: DC

## 2016-03-10 NOTE — Telephone Encounter (Signed)
Needs refill on fluticasone.  [facility] [place] [facility].

## 2016-03-25 DIAGNOSIS — Z961 Presence of intraocular lens: Secondary | ICD-10-CM | POA: Diagnosis not present

## 2016-03-25 DIAGNOSIS — H40013 Open angle with borderline findings, low risk, bilateral: Secondary | ICD-10-CM | POA: Diagnosis not present

## 2016-04-08 ENCOUNTER — Other Ambulatory Visit: Payer: Self-pay | Admitting: Family Medicine

## 2016-04-08 ENCOUNTER — Other Ambulatory Visit: Payer: Self-pay | Admitting: Internal Medicine

## 2016-04-08 DIAGNOSIS — Z1231 Encounter for screening mammogram for malignant neoplasm of breast: Secondary | ICD-10-CM

## 2016-04-20 ENCOUNTER — Ambulatory Visit (INDEPENDENT_AMBULATORY_CARE_PROVIDER_SITE_OTHER): Payer: Medicare Other | Admitting: Student

## 2016-04-20 ENCOUNTER — Encounter: Payer: Self-pay | Admitting: Student

## 2016-04-20 DIAGNOSIS — R7303 Prediabetes: Secondary | ICD-10-CM

## 2016-04-20 DIAGNOSIS — R42 Dizziness and giddiness: Secondary | ICD-10-CM

## 2016-04-20 NOTE — Assessment & Plan Note (Signed)
Likely orthostatic hypotension. Last spell was when she got out of bed last week. Orthostatic vital positive but not symptomatic. Recommended adequate hydration and progressive ambulation. Doubt arrhythmia.

## 2016-04-20 NOTE — Progress Notes (Signed)
Subjective:    Susan Ingram is a 76 y.o. old female here for her pre-daibetes and dizzy spell.   HPI Prediabetes: not on any medicine. Last A1c 6.4 about two months ago. Wants diet recommendation. She says she has been eating a lot of pork. Likes to stop. Eats vegies now and then. Likes bread and meat. Stopped drinking soda. Drinks about 2 glasses of orange juice. She says she is water drinker. Walks now and then, once a week. Walks about 20 minutes. Denies smoking. Denies drinking alcohol.   Dizzy spell: had this for long time. Last spell last week. "Today I feel very good". Last spell happened when she got out of the bed. No further spells after that. Describes as lightheadedness. Denies fall or loss of consciousness. Denies vision change or focal weakness or numbness. Denis exertional chest pain, dyspnea. Felt a little odd in her chest when she felt dizzy.  PMH/Problem List: has History of lung cancer; Hypertension; Asthma; History of vitamin D deficiency; Dizzy spells; Hyperlipidemia; Preventative health care; Knee pain, right; Heartburn; Heme positive stool; Intermittent dysphagia; Helicobacter pylori gastritis; Ear discomfort; Prediabetes; Headache; Retina disorder, left; Allergic rhinitis; Skin lesion of right leg; Cervical stenosis of spinal canal; Neoplasm of uncertain behavior; and Depressed mood on her problem list.   has a past medical history of Arthritis; Asthma; Cancer ([place]); Eczema; Helicobacter pylori gastritis (10/08/2014); Hypercholesterolemia; and Hypertension.  FH:  Family History  Problem Relation Age of Onset  . Alzheimer's disease Father   . Early death Mother   . Hypertension Mother   . Stroke Mother   . Diabetes Brother     [facility] Social History  Substance Use Topics  . Smoking status: Former Smoker    Quit date: 04/15/1993  . Smokeless tobacco: Never Used  . Alcohol use No    Review of Systems Review of systems negative except for pertinent positives and negatives in  history of present illness above.     Objective:     Vitals:   04/20/16 1452 04/20/16 1502  BP: (!) 144/80 138/70  Pulse: 93   Temp: 98.3 F (36.8 C)   TempSrc: Oral   SpO2: 98%   Weight: 185 lb (83.9 kg)   Height: '5\' 6"'$  (1.676 m)    Orthostatic VS for the past 24 hrs (Last 3 readings):  BP- Lying Pulse- Lying BP- Sitting Pulse- Sitting BP- Standing at 0 minutes Pulse- Standing at 0 minutes  04/20/16 1457 132/80 70 138/70 83 138/68 86   Physical Exam GEN: appears well, no apparent distress. CVS: RRR, nl S1&S2, 1/6 SEM over RUSB, no edema, cap refills < 2 secs, 2+ left radial pulse and repeat manual BP 110/70 in both arms RESP: speaks in full sentence, no IWOB, good air movement bilaterally, CTAB GI: BS present & normal, soft, NTND MSK: no lower extremity edema NEURO: alert and oiented appropriately, no gross defecits  PSYCH: euthymic mood with congruent affect    Assessment and Plan:  Prediabetes A1c 6.4 two months ago. Discussed about life style including diet and exercise. Gave handout about portion size, exercise and [facility] low glycemic diet list.   Follow up with PCP in three to four months.  Dizzy spells Likely orthostatic hypotension. Last spell was when she got out of bed last week. Orthostatic vital positive but not symptomatic. Recommended adequate hydration and progressive ambulation. Doubt arrhythmia.   Return if symptoms worsen or fail to improve.  Mercy Riding, MD 04/20/16 Pager: [phone]

## 2016-04-20 NOTE — Patient Instructions (Signed)
It was great seeing you today! We have addressed the following issues today 1. Blood sugar: your number from 2 months ago indicates that you have prediabetes, not diabetes yet.   I strongly recommend lifestyle changes including diet and exercise as below. Come back and see [place] in 4 months to follow up on this. 2.   Dizzy spell: Please make sure that you're always hydrated. Get up from lying or sitting slowly. Please come back and see [place] if you have further his dizzy spell. Seek immediate care if you have chest pain, trouble breathing or other symptoms concerning to you.   If we did any lab work today, and the results require attention, either me or my nurse will get in touch with you. If everything is normal, you will get a letter in mail and a message via . If you don't hear from [place] in two weeks, please give [place] a call. Otherwise, we look forward to seeing you again at your next visit. If you have any questions or concerns before then, please call the clinic at [phone].  Please bring all your medications to every doctors visit  Sign up for My Chart to have easy access to your labs results, and communication with your Primary care physician.    Please check-out at the front desk before leaving the clinic.    Take Care,   Dr. Cyndia Skeeters  Portion Size    Choose healthier foods such as 100% whole grains, vegetables, fruits, beans, nut seeds, olive oil, most vegetable oils, fat-free dietary, wild game and fish.   Avoid sweet tea, other sweetened beverages, soda, fruit juice, cold cereal and milk and trans fat.   Eat at least 3 meals and 1-2 snacks per day.  Aim for no more than 5 hours between eating.  Eat breakfast within one hour of getting up.    Exercise at least 150 minutes per week, including weight resistance exercises 3 or 4 times per week.   Try to lose at least 7-10% of your current body weight.

## 2016-04-20 NOTE — Assessment & Plan Note (Addendum)
A1c 6.4 two months ago. Discussed about life style including diet and exercise. Gave handout about portion size, exercise and [facility] low glycemic diet list.   Follow up with PCP in three to four months.

## 2016-05-17 ENCOUNTER — Ambulatory Visit
Admission: RE | Admit: 2016-05-17 | Discharge: 2016-05-17 | Disposition: A | Discharge status: Home / Self Care | Payer: Medicare Other | Source: Ambulatory Visit | Attending: Family Medicine | Admitting: Family Medicine

## 2016-05-17 DIAGNOSIS — Z1231 Encounter for screening mammogram for malignant neoplasm of breast: Secondary | ICD-10-CM

## 2016-06-02 ENCOUNTER — Ambulatory Visit (INDEPENDENT_AMBULATORY_CARE_PROVIDER_SITE_OTHER): Payer: Medicare Other | Admitting: Internal Medicine

## 2016-06-02 ENCOUNTER — Encounter: Payer: Self-pay | Admitting: Internal Medicine

## 2016-06-02 VITALS — BP 110/78 | HR 87 | Temp 98.7°F | Ht 66.0 in | Wt 184.0 lb

## 2016-06-02 DIAGNOSIS — R0989 Other specified symptoms and signs involving the circulatory and respiratory systems: Secondary | ICD-10-CM

## 2016-06-02 DIAGNOSIS — R6889 Other general symptoms and signs: Secondary | ICD-10-CM

## 2016-06-02 DIAGNOSIS — R7303 Prediabetes: Secondary | ICD-10-CM

## 2016-06-02 LAB — POCT GLYCOSYLATED HEMOGLOBIN (HGB A1C): Hemoglobin A1C: 6.4

## 2016-06-02 MED ORDER — RANITIDINE HCL 150 MG PO TABS: 150.0000 mg | ORAL_TABLET | Freq: Every day | ORAL | 0 refills | Status: DC

## 2016-06-02 NOTE — Assessment & Plan Note (Signed)
Think this is likely due to uncontrolled GERD. She endorses significant heartburn. She stopped taking her Zantac at some point, but cannot remember when this was. She feels like her allergies are well-controlled with the Zyrtec and Flonase, so I don't think this is the cause. She uses her Albuterol 1-2 times per day but denies shortness of breath, so I don't think that asthma is contributing. She could also have a psychogenic component contributing. She does have a history of H. Pylori gastritis and "intermittent dysphagia" is also listed on her problem list, so if this does not improve with medical treatment, should consider re-testing for H. Pylori vs referral to GI for evaluation for dysphagia. - Zantac refilled - Follow-up with PCP in 4 weeks if no improvement

## 2016-06-02 NOTE — Patient Instructions (Signed)
It was so nice to meet you!  I think your throat issues are coming from reflux. I have restarted a medication that you were taking in the past called Zantac. Please take 1 tablet every night. Try this for about 4 weeks. If it is not helping, please schedule an appointment with Dr. Juleen China.  We also checked your diabetes today. Please follow-up with Dr. Juleen China for a regular diabetes check.  -Dr. Brett Albino

## 2016-06-02 NOTE — Progress Notes (Signed)
   [facility] Phone: [phone]  Subjective:  Susan Ingram is a 76 year old female presenting to same day clinic for clearing her throat very frequently over the last year. The throat clearing is worse at night. She feels like she has mucus in her throat. She also notes "quite a bit of heartburn". She has been prescribed Zantac in the past for heartburn, but stopped taking it at some point in the past. No sore throat, no cough. She endorses occasional rhinorrhea that greatly improves with Flonase and Zyrtec. She takes Albuterol 1-2 times per day. She denies frequent shortness of breath or chest pain. She endorses occasional dysphagia, but states this really doesn't impact her ability to eat. She does not feel like food gets stuck.  ROS: See HPI for pertinent positives and negatives  Past Medical History- HTN, asthma, allergic rhinitis, hx H. Pylori gastritis, hx lung cancer  Family history reviewed for today's visit. No changes.  Social history- patient is a former smoker.  Objective: BP 110/78   Pulse 87   Temp 98.7 F (37.1 C) (Oral)   Ht '5\' 6"'$  (1.676 m)   Wt 184 lb (83.5 kg)   SpO2 99%   BMI 29.70 kg/m  Gen: NAD, alert, cooperative with exam HEENT: NCAT, EOMI, MMM, oropharynx mildly erythematous Neck: FROM, supple, no thyromegaly or cervical lymphadenopathy, no other masses in the neck CV: RRR, no murmur Resp: CTABL, no wheezes, normal work of breathing  Assessment/Plan: Throat Clearing: Think this is likely due to uncontrolled GERD. She endorses significant heartburn. She stopped taking her Zantac at some point, but cannot remember when this was. She feels like her allergies are well-controlled with the Zyrtec and Flonase, so I don't think this is the cause. She uses her Albuterol 1-2 times per day but denies shortness of breath, so I don't think that asthma is contributing. She could also have a psychogenic component contributing. She does have a history of H.  Pylori gastritis and "intermittent dysphagia" is also listed on her problem list, so if this does not improve with medical treatment, should consider re-testing for H. Pylori vs referral to GI for evaluation for dysphagia. - Zantac refilled - Follow-up with PCP in 4 weeks if no improvement   Hyman Bible, MD PGY-2

## 2016-06-29 ENCOUNTER — Other Ambulatory Visit: Payer: Self-pay | Admitting: Internal Medicine

## 2016-06-29 MED ORDER — CETIRIZINE HCL 10 MG PO TABS: 10.0000 mg | ORAL_TABLET | Freq: Every day | ORAL | 11 refills | Status: DC

## 2016-06-29 NOTE — Telephone Encounter (Signed)
Pt calling to request refill of:  Name of Medication(s):  cetirzine Last date of OV: 06-02-16 Pharmacy:  [facility]  Will route refill request to Clinic RN.  Discussed with patient policy to call pharmacy for future refills.  Also, discussed refills may take up to 48 hours to approve or deny.  Renella Cunas

## 2016-07-15 ENCOUNTER — Encounter: Payer: Self-pay | Admitting: Internal Medicine

## 2016-07-15 ENCOUNTER — Ambulatory Visit (INDEPENDENT_AMBULATORY_CARE_PROVIDER_SITE_OTHER): Payer: Medicare Other | Admitting: Internal Medicine

## 2016-07-15 VITALS — BP 150/68 | HR 57 | Temp 97.4°F | Ht 66.0 in | Wt 186.2 lb

## 2016-07-15 DIAGNOSIS — Z23 Encounter for immunization: Secondary | ICD-10-CM

## 2016-07-15 DIAGNOSIS — R7303 Prediabetes: Secondary | ICD-10-CM | POA: Diagnosis not present

## 2016-07-15 DIAGNOSIS — I10 Essential (primary) hypertension: Secondary | ICD-10-CM

## 2016-07-15 MED ORDER — LOSARTAN POTASSIUM 50 MG PO TABS: 50.0000 mg | ORAL_TABLET | Freq: Every day | ORAL | 0 refills | Status: DC

## 2016-07-15 NOTE — Patient Instructions (Addendum)
I have prescribed losartan. Take this in place of the Lisinopril. Follow up in 1-2 weeks for lab visit with this new medication and RN blood pressure check. Please see me again in 3 months.   Diabetes Mellitus and Food It is important for you to manage your blood sugar (glucose) level. Your blood glucose level can be greatly affected by what you eat. Eating healthier foods in the appropriate amounts throughout the day at about the same time each day will help you control your blood glucose level. It can also help slow or prevent worsening of your diabetes mellitus. Healthy eating may even help you improve the level of your blood pressure and reach or maintain a healthy weight. General recommendations for healthful eating and cooking habits include:  Eating meals and snacks regularly. Avoid going long periods of time without eating to lose weight.  Eating a diet that consists mainly of plant-based foods, such as fruits, vegetables, nuts, legumes, and whole grains.  Using low-heat cooking methods, such as baking, instead of high-heat cooking methods, such as deep frying.  Work with your dietitian to make sure you understand how to use the Nutrition Facts information on food labels. How can food affect me? Carbohydrates Carbohydrates affect your blood glucose level more than any other type of food. Your dietitian will help you determine how many carbohydrates to eat at each meal and teach you how to count carbohydrates. Counting carbohydrates is important to keep your blood glucose at a healthy level, especially if you are using insulin or taking certain medicines for diabetes mellitus. Alcohol Alcohol can cause sudden decreases in blood glucose (hypoglycemia), especially if you use insulin or take certain medicines for diabetes mellitus. Hypoglycemia can be a life-threatening condition. Symptoms of hypoglycemia (sleepiness, dizziness, and disorientation) are similar to symptoms of having too much  alcohol. If your health care provider has given you approval to drink alcohol, do so in moderation and use the following guidelines:  Women should not have more than one drink per day, and men should not have more than two drinks per day. One drink is equal to: ? 12 oz of beer. ? 5 oz of wine. ? 1 oz of hard liquor.  Do not drink on an empty stomach.  Keep yourself hydrated. Have water, diet soda, or unsweetened iced tea.  Regular soda, juice, and other mixers might contain a lot of carbohydrates and should be counted.  What foods are not recommended? As you make food choices, it is important to remember that all foods are not the same. Some foods have fewer nutrients per serving than other foods, even though they might have the same number of calories or carbohydrates. It is difficult to get your body what it needs when you eat foods with fewer nutrients. Examples of foods that you should avoid that are high in calories and carbohydrates but low in nutrients include:  Trans fats (most processed foods list trans fats on the Nutrition Facts label).  Regular soda.  Juice.  Candy.  Sweets, such as cake, pie, doughnuts, and cookies.  Fried foods.  What foods can I eat? Eat nutrient-rich foods, which will nourish your body and keep you healthy. The food you should eat also will depend on several factors, including:  The calories you need.  The medicines you take.  Your weight.  Your blood glucose level.  Your blood pressure level.  Your cholesterol level.  You should eat a variety of foods, including:  Protein. ? [employer]  cuts of meat. ? Proteins low in saturated fats, such as fish, egg whites, and beans. Avoid processed meats.  Fruits and vegetables. ? Fruits and vegetables that may help control blood glucose levels, such as apples, mangoes, and yams.  Dairy products. ? Choose fat-free or low-fat dairy products, such as milk, yogurt, and cheese.  Grains, bread, pasta,  and rice. ? Choose whole grain products, such as multigrain bread, whole oats, and brown rice. These foods may help control blood pressure.  Fats. ? Foods containing healthful fats, such as nuts, avocado, olive oil, canola oil, and fish.  Does everyone with diabetes mellitus have the same meal plan? Because every person with diabetes mellitus is different, there is not one meal plan that works for everyone. It is very important that you meet with a dietitian who will help you create a meal plan that is just right for you. This information is not intended to replace advice given to you by your health care provider. Make sure you discuss any questions you have with your health care provider. Document Released: 10/29/2004 Document Revised: 07/10/2015 Document Reviewed: 12/29/2012 Elsevier Interactive Patient Education  2017 [employer].

## 2016-07-15 NOTE — Progress Notes (Signed)
Subjective:    Susan Ingram - 76 y.o. female MRN [id number]  Date of birth: 11-Nov-1940  HPI  Susan Ingram is here for follow up of chronic medical conditions.  Chronic HTN Disease Monitoring:  Home BP Monitoring - No. Does not check at home or at pharmacy.  Chest pain- no  Dyspnea- no Headache - no  Medications: Lisinopril 20 mg daily and HCTZ 25 mg daily  Compliance- no, stopped taking Lisinopril due to thought that it was making her gain weight Lightheadedness- no  Edema- no   Preventitive Healthcare:  Exercise: Walking twice per week    Diet Pattern: Eats meals at home.   Salt Restriction: Not really   Prediabetes  Disease Monitoring: Does not monitor CBGs at home.  Polyuria: mild  Visual problems: no   Urine Microalbumin On AceI   Last A1C: 6.4 (April 2018) No medications. Lifestyle changes.  Preventitive Health Care Eye Exam: Goes yearly. Due for an exam now.  Foot Exam: Needs    Health Maintenance Due  Topic Date Due  . FOOT EXAM  06/30/1949  . OPHTHALMOLOGY EXAM  06/30/1949  . TETANUS/TDAP  07/01/1958  . PNA vac Low Risk Adult (2 of 2 - PPSV23) 01/03/2015    -  reports that she quit smoking about 23 years ago. She has never used smokeless tobacco. - Review of Systems: Per HPI. - Past Medical History: Patient Active Problem List   Diagnosis Date Noted  . Depressed mood 02/11/2016  . Neoplasm of uncertain behavior 09/98/3382  . Skin lesion of right leg 09/03/2015  . Cervical stenosis of spinal canal 09/03/2015  . Prediabetes 04/22/2015  . Headache 04/22/2015  . 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 Allergic rhinitis 04/22/2015  . Ear discomfort 01/03/2015  . Helicobacter pylori gastritis 10/08/2014  . Intermittent dysphagia 08/14/2014  . Heme positive stool 07/24/2014  . Heartburn 06/18/2014  . Knee pain, right 04/09/2014  . Preventative health care 01/03/2014  .  Hyperlipidemia 01/02/2014  . Throat clearing 09/15/2013  . Dizzy spells 08/11/2013  . Hypertension 02/18/2012  . Asthma 02/18/2012  . History of vitamin D deficiency 02/18/2012  . History of lung cancer 11/24/2010   - Medications: reviewed and updated   Objective:   Physical Exam BP (!) 150/68   Pulse (!) 57   Temp 97.4 F (36.3 C) (Oral)   Ht 5\' 6"  (1.676 m)   Wt 186 lb 3.2 oz (84.5 kg)   SpO2 96%   BMI 30.05 kg/m  Gen: NAD, alert, cooperative with exam, well-appearing CV: RRR, good S1/S2, no murmur, no edema, capillary refill brisk  Resp: CTABL, no wheezes, non-labored  Diabetic Foot Check -  Appearance - no lesions, ulcers or calluses Skin - no unusual pallor or redness Monofilament testing - normal bilaterally  Right - Great toe, medial, central, lateral ball and posterior foot intact Left - Great toe, medial, central, lateral ball and posterior foot intact    Assessment & Plan:   1. Essential hypertension Patient self discontinued Lisinopril due to concern of weight gain/bloating. While weight is mildly increased from previous office visit, it is consistent with previous weights documented over the past year. Ideally, would like slightly better BP control. Will start Arb due to h/o diabetes for renal protection.  - losartan (COZAAR) 50 MG tablet; Take 1 tablet (50 mg total) by mouth daily.  Dispense: 90 tablet; Refill: 0 - Basic Metabolic Panel; Future in 1-2 weeks -RN BP check in 1-2 weeks  2. Prediabetes A1c stable from previously checked A1c. Discussed appropriate diet regimen. Patient is walking two times per week and is willing to increase exercise. Diabetic foot exam completed today and patient plans to have eye exam soon.   3. Need for vaccination - Pneumococcal polysaccharide vaccine 23-valent greater than or equal to 2yo subcutaneous/IM  Phill Myron, D.O. 07/15/2016, 2:03 PM PGY-2, [place]

## 2016-07-23 ENCOUNTER — Encounter: Payer: Self-pay | Admitting: Family Medicine

## 2016-07-23 ENCOUNTER — Ambulatory Visit (INDEPENDENT_AMBULATORY_CARE_PROVIDER_SITE_OTHER): Payer: Medicare Other | Admitting: Family Medicine

## 2016-07-23 VITALS — BP 141/70 | HR 58 | Temp 98.4°F | Wt 184.0 lb

## 2016-07-23 DIAGNOSIS — H6990 Unspecified Eustachian tube disorder, unspecified ear: Secondary | ICD-10-CM

## 2016-07-23 DIAGNOSIS — H6983 Other specified disorders of Eustachian tube, bilateral: Secondary | ICD-10-CM

## 2016-07-23 DIAGNOSIS — I10 Essential (primary) hypertension: Secondary | ICD-10-CM

## 2016-07-23 DIAGNOSIS — H6980 Other specified disorders of Eustachian tube, unspecified ear: Secondary | ICD-10-CM | POA: Insufficient documentation

## 2016-07-23 HISTORY — DX: Unspecified eustachian tube disorder, unspecified ear: H69.90

## 2016-07-23 HISTORY — DX: Other specified disorders of Eustachian tube, unspecified ear: H69.80

## 2016-07-23 NOTE — Progress Notes (Signed)
    Subjective:  Susan Ingram is a 76 y.o. female who presents to the [facility] today for follow up for chronic medical problems  HPI:  Hypertension Blood pressure at home: Have not been taking them Blood pressure today: 141/70 Meds: Compliant with losartan Side effects: none ROS: Denies headache, dizziness, visual changes, nausea, vomiting, chest pain, abdominal pain or shortness of breath.  Ear fullness: Reports post-nasal drip and some bilateral ear fullness and popping/clicking when swallowing or yawning. No changes in hearing, ringing or pain.   PMH: HTN, lung ca Tobacco use: former smoker, lung ca 2010 Medication: reviewed and updated  Objective:  Physical Exam: BP (!) 141/70   Pulse (!) 58   Temp 98.4 F (36.9 C) (Oral)   Wt 184 lb (83.5 kg)   SpO2 99%   BMI 29.70 kg/m   Gen: 76 yo F in NAD, resting comfortably HEENT: AT/[name], TMs clear bilaterally, EOMI, PERRL, nasal drainage, oropharynx CV: RRR with no murmurs appreciated Pulm: NWOB, CTAB with no crackles, wheezes, or rhonchi GI: Normal bowel sounds present. Soft, Nontender, Nondistended. MSK: no edema, cyanosis, or clubbing noted Skin: warm, dry Neuro: grossly normal, moves all extremities Psych: Normal affect and thought content  No results found for this or any previous visit (from the past 72 hour(s)).   Assessment/Plan:  Eustachian tube dysfunction Symptoms in bilateral ears. Most consistent with eustachian tube dysfunction.  Already on flonase.  - will add afrin x3 days - return precautions discussed with patient   Hypertension Blood pressure follow up after starting losartan and pressures WNL today. No side effects and patient has been compliant with medication.   - continue losartan 50mg  daily - f/u in 3 months

## 2016-07-23 NOTE — Patient Instructions (Signed)
Susan Ingram, he was seen today for a blood pressure follow-up.    Pressure is normal today and I'm glad that you're not having any side effects with the medication. Please continue taking the medicine daily.  The discomfort in your year is likely due to something called eustachian tube dysfunction. Please continue taking your Flonase daily and I'm also recommending that you start a medicine called Afrin which is a nasal decongestant.  Please do not use this medicine for more than 3 days.  Was very nice to see today please follow-up in 3 months for a repeat blood pressure.  Daniel L. Rosalyn Gess, MD 07/23/2016 2:57 PM

## 2016-07-23 NOTE — Assessment & Plan Note (Signed)
Symptoms in bilateral ears. Most consistent with eustachian tube dysfunction.  Already on flonase.  - will add afrin x3 days - return precautions discussed with patient

## 2016-07-23 NOTE — Assessment & Plan Note (Addendum)
Blood pressure follow up after starting losartan and pressures WNL today. No side effects and patient has been compliant with medication.   - continue losartan 50mg  daily - f/u in 3 months

## 2016-07-24 LAB — BASIC METABOLIC PANEL
BUN/Creatinine Ratio: 14 (ref 12–28)
BUN: 12 mg/dL (ref 8–27)
CO2: 26 mmol/L (ref 18–29)
Calcium: 9.8 mg/dL (ref 8.7–10.3)
Chloride: 103 mmol/L (ref 96–106)
Creatinine, Ser: 0.86 mg/dL (ref 0.57–1.00)
GFR calc Af Amer: 75 mL/min/{1.73_m2} (ref >59)
GFR calc non Af Amer: 65 mL/min/{1.73_m2} (ref >59)
Glucose: 81 mg/dL (ref 65–99)
Potassium: 4.6 mmol/L (ref 3.5–5.2)
Sodium: 140 mmol/L (ref 134–144)

## 2016-09-06 ENCOUNTER — Ambulatory Visit: Payer: Medicare Other | Admitting: Internal Medicine

## 2016-09-15 ENCOUNTER — Ambulatory Visit: Payer: Medicare Other | Admitting: Internal Medicine

## 2016-09-15 ENCOUNTER — Encounter: Payer: Self-pay | Admitting: Internal Medicine

## 2016-09-15 ENCOUNTER — Ambulatory Visit (INDEPENDENT_AMBULATORY_CARE_PROVIDER_SITE_OTHER): Payer: Medicare Other | Admitting: Internal Medicine

## 2016-09-15 VITALS — BP 118/64 | HR 76 | Temp 98.1°F | Ht 66.0 in | Wt 183.0 lb

## 2016-09-15 DIAGNOSIS — R5383 Other fatigue: Secondary | ICD-10-CM

## 2016-09-15 DIAGNOSIS — R739 Hyperglycemia, unspecified: Secondary | ICD-10-CM

## 2016-09-15 LAB — POCT GLYCOSYLATED HEMOGLOBIN (HGB A1C): Hemoglobin A1C: 6.4

## 2016-09-15 NOTE — Patient Instructions (Signed)
So good to see you today! Sorry you've had low energy the last couple of weeks. I will check some labs today and call you with the results. I will also talk with our Social Worker to see if there is something else we can do to help out.

## 2016-09-15 NOTE — Progress Notes (Signed)
   Subjective:    Susan Ingram - 76 y.o. female MRN [id number]  Date of birth: March 18, 1940  HPI  Susan Ingram is here for fatigue.  Fatigue: Patient reports that she has had "low energy" for the past couple of weeks. She has some stressors in life as she lost her job in November and has been unable to find another one although she has interviewed/applied at several places. She is worried that she may have to move from where she has been living for the past 45 years as her social security check is not enough to cover rent and daily living expenses without supplemental income. She denies feeling depressed or SI. She finds comfort in knowing that God will ultimately take care of her. She denies changes in weight, night sweats, headaches, vision changes, weakness, nausea/vomiting or changes in stools.   -  reports that she quit smoking about 23 years ago. She has never used smokeless tobacco. - Review of Systems: Per HPI. - Past Medical History: Patient Active Problem List   Diagnosis Date Noted  . Eustachian tube dysfunction 07/23/2016  . Depressed mood 02/11/2016  . Neoplasm of uncertain behavior 40/11/2723  . Skin lesion of right leg 09/03/2015  . Cervical stenosis of spinal canal 09/03/2015  . Prediabetes 04/22/2015  . Headache 04/22/2015  . 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 Allergic rhinitis 04/22/2015  . Ear discomfort 01/03/2015  . Helicobacter pylori gastritis 10/08/2014  . Intermittent dysphagia 08/14/2014  . Heme positive stool 07/24/2014  . Heartburn 06/18/2014  . Knee pain, right 04/09/2014  . Preventative health care 01/03/2014  . Hyperlipidemia 01/02/2014  . Throat clearing 09/15/2013  . Dizzy spells 08/11/2013  . Hypertension 02/18/2012  . Asthma 02/18/2012  . History of vitamin D deficiency 02/18/2012  . History of lung cancer 11/24/2010   - Medications: reviewed and updated   Objective:   Physical Exam Ht 5\' 6"  (1.676 m)   Wt 183 lb (83 kg)   BMI  29.54 kg/m  Gen: NAD, alert, cooperative with exam, well-appearing CV: RRR, good S1/S2, no murmur, no edema, capillary refill brisk  Resp: CTABL, no wheezes, non-labored  Assessment & Plan:   1. Hyperglycemia Patient with pre-diabetes with A1c 6.4 in March 2018. Discussed diet and exercise.  - HgB A1c  2. Fatigue, unspecified type Suspect mostly stress reaction due to inability to find a job and patient is used to having more day to day tasks to keep her busy. Will check some labs to ensure no other contributing factors. Will touch base with Social Work to see if there is any further assistance that can be provided.  - CBC - TSH - Basic Metabolic Panel   Phill Myron, D.O. 09/15/2016, 4:24 PM PGY-3, [place]

## 2016-09-16 ENCOUNTER — Telehealth: Payer: Self-pay | Admitting: Licensed Clinical Social Worker

## 2016-09-16 LAB — CBC
Hematocrit: 39.8 % (ref 34.0–46.6)
Hemoglobin: 12.9 g/dL (ref 11.1–15.9)
MCH: 27 pg (ref 26.6–33.0)
MCHC: 32.4 g/dL (ref 31.5–35.7)
MCV: 83 fL (ref 79–97)
Platelets: 289 10*3/uL (ref 150–379)
RBC: 4.78 x10E6/uL (ref 3.77–5.28)
RDW: 15.8 % — ABNORMAL HIGH (ref 12.3–15.4)
WBC: 6.6 10*3/uL (ref 3.4–10.8)

## 2016-09-16 LAB — BASIC METABOLIC PANEL
BUN/Creatinine Ratio: 15 (ref 12–28)
BUN: 14 mg/dL (ref 8–27)
CO2: 22 mmol/L (ref 20–29)
Calcium: 9.7 mg/dL (ref 8.7–10.3)
Chloride: 100 mmol/L (ref 96–106)
Creatinine, Ser: 0.94 mg/dL (ref 0.57–1.00)
GFR calc Af Amer: 68 mL/min/{1.73_m2} (ref >59)
GFR calc non Af Amer: 59 mL/min/{1.73_m2} — ABNORMAL LOW (ref >59)
Glucose: 110 mg/dL — ABNORMAL HIGH (ref 65–99)
Potassium: 3.7 mmol/L (ref 3.5–5.2)
Sodium: 142 mmol/L (ref 134–144)

## 2016-09-16 LAB — TSH: TSH: 1.16 u[IU]/mL (ref 0.450–4.500)

## 2016-09-16 NOTE — Progress Notes (Signed)
Social work consult from Dr. Juleen China ref stress due to unemployment and financial difficulties.  Called patient ref. the above consult.  Patient reports she feels a little better since completing her resume, granddaughter is assisting her with on-line applications, and she has applied for low-income housing.   LCSW reviewed other employment and training options for senior adults via the [place] program at the [facility].   Patient appreciative of the information and thanked LCSW for calling.  Plan:   1. Patient will call [place] employment program 2. LCSW will f/u in 2 weeks 3. Patient will contact LCSW as needed  Casimer Lanius, LCSW Licensed Clinical Social Worker [place] [facility]   [phone] 3:02 PM

## 2016-09-30 ENCOUNTER — Telehealth: Payer: Self-pay | Admitting: Licensed Clinical Social Worker

## 2016-09-30 NOTE — Progress Notes (Signed)
Social work f/u phone call to patient reference resources provided for employment resources for seniors.. Patient had an appointment with [employer] paid training program last week.  She is excited to start the program in about 6 to 8 weeks.  Patient's granddaughter will continue to assist her with job search.  Patient very appreciative of the f/u call.  Casimer Lanius, LCSW Licensed Clinical Social Worker [facility]   [phone] 3:55 PM

## 2016-10-11 ENCOUNTER — Other Ambulatory Visit: Payer: Self-pay | Admitting: Internal Medicine

## 2016-10-11 DIAGNOSIS — I10 Essential (primary) hypertension: Secondary | ICD-10-CM

## 2016-10-13 ENCOUNTER — Other Ambulatory Visit: Payer: Self-pay | Admitting: Internal Medicine

## 2016-10-13 MED ORDER — ALBUTEROL SULFATE HFA 108 (90 BASE) MCG/ACT IN AERS: 2.0000 | INHALATION_SPRAY | Freq: Four times a day (QID) | RESPIRATORY_TRACT | 2 refills | Status: DC | PRN

## 2016-10-13 NOTE — Telephone Encounter (Signed)
Needs  bentolin-hfa  Refilled.  [facility] on [address] .  Nurse line was busy

## 2016-10-13 NOTE — Telephone Encounter (Signed)
Received fax from [place] that patient's insurance will not cover Ventolin HFA. Please change Rx to ProAir HFA.  Derl Barrow, RN

## 2016-10-22 ENCOUNTER — Telehealth: Payer: Self-pay | Admitting: *Deleted

## 2016-10-22 NOTE — Telephone Encounter (Signed)
[employer] calling to inform PCP that Ventolin is non-preferred and is not covered by [facility].  ProAir is preferred inhaler.  Will route note to PCP for medication change.  Burna Forts, BSN, RN-BC

## 2016-10-25 MED ORDER — ALBUTEROL SULFATE HFA 108 (90 BASE) MCG/ACT IN AERS: 2.0000 | INHALATION_SPRAY | Freq: Four times a day (QID) | RESPIRATORY_TRACT | 1 refills | Status: DC | PRN

## 2016-10-25 NOTE — Telephone Encounter (Signed)
Have sent in ProAir inhaler.    Phill Myron, D.O. 10/25/2016, 7:43 AM PGY-3, [place]

## 2016-11-16 ENCOUNTER — Ambulatory Visit (INDEPENDENT_AMBULATORY_CARE_PROVIDER_SITE_OTHER): Payer: Medicare Other | Admitting: *Deleted

## 2016-11-16 DIAGNOSIS — Z23 Encounter for immunization: Secondary | ICD-10-CM

## 2016-11-17 ENCOUNTER — Telehealth: Payer: Self-pay | Admitting: Internal Medicine

## 2016-11-17 NOTE — Telephone Encounter (Signed)
Pt called back. She doesn't understand why she hasnt been given an answer yet.  She had lab work done 8-1 and has never received the results.  Please call ASAP

## 2016-11-17 NOTE — Telephone Encounter (Signed)
Pt said she was called this morning.  She wants to know if she has sugar in her eye.  She says she had lab work sometime ago and never heard back.  After talking to her, I think she go an automated call asking her about a diabetic eye exam.  Please call her and let her know if she has the sugars.

## 2016-11-18 NOTE — Telephone Encounter (Signed)
Patient had normal blood counts, electrolytes, thyroid levels, and kidney function on her blood work from 8/1. She has pre-diabetes as we have discussed in the past. Given this, she should have an eye exam to check for damage to her eyes from elevated blood sugars. Her blood sugar on labs from 8/1 was only minimally elevated to 110.   Phill Myron, D.O. 11/18/2016, 8:50 AM PGY-3, Susan Ingram

## 2016-11-18 NOTE — Telephone Encounter (Signed)
Called pt. Phone rang with no option to leave a message. If pt calls, please give her the information below. Ottis Stain, CMA

## 2016-12-15 ENCOUNTER — Telehealth: Payer: Self-pay

## 2016-12-15 NOTE — Telephone Encounter (Signed)
Patient needs a refill of hydrochlorothiazide. She has called the pharmacy and has no refills left.Susan Ingram

## 2016-12-16 ENCOUNTER — Other Ambulatory Visit: Payer: Self-pay | Admitting: Internal Medicine

## 2016-12-16 DIAGNOSIS — I10 Essential (primary) hypertension: Secondary | ICD-10-CM

## 2016-12-16 MED ORDER — HYDROCHLOROTHIAZIDE 25 MG PO TABS: 25.0000 mg | ORAL_TABLET | Freq: Every day | ORAL | 3 refills | Status: DC

## 2016-12-16 NOTE — Progress Notes (Signed)
Tried to call pt. Phone rang with no answer, no voicemail. Ottis Stain, CMA

## 2016-12-16 NOTE — Progress Notes (Signed)
Refill for HCTZ sent in.   Phill Myron, D.O. 12/16/2016, 10:08 AM PGY-3, [place]

## 2016-12-24 ENCOUNTER — Telehealth: Payer: Self-pay | Admitting: Internal Medicine

## 2016-12-24 MED ORDER — CETIRIZINE HCL 10 MG PO TABS: 10.0000 mg | ORAL_TABLET | Freq: Every day | ORAL | 11 refills | Status: DC

## 2016-12-24 NOTE — Telephone Encounter (Signed)
Have sent in refill.   Phill Myron, D.O. 12/24/2016, 10:45 AM PGY-3, [place]

## 2016-12-24 NOTE — Telephone Encounter (Signed)
Pt called and said she is completely out of her cetirizine and needs a refilled called into the pharmacy. Please advise.

## 2016-12-27 NOTE — Progress Notes (Signed)
[facility] Phone: [phone]   Date of Visit: 12/28/2016   HPI:  Epigastric Discomfort:  -Patient reports that last Saturday she had an episode where she felt like her food got stuck in her throat.  She can belch had to induce herself to vomit.  She felt fine after that.  She reports that this is most likely due to her ill fitting dentures.  She has a hard time showing her food because of her dentures.  She is looking into getting them refitted. -She also reports of epigastric discomfort for the past 2 weeks.  She denies that this is painful.  The best way she can describe it is that she feels gassy.  She denies it being constant.  It is not worsened with eating.  There is no pattern to her symptoms. -She also reports of constipation for the past 3-4 weeks. she usually has bowel movements 2 times a day.  Now she is going may be every other day.  She does not have to strain but reports that her stool is hard.  Bristol stool scale of 1-2.  Her last BM was this morning.  She reports of not drinking as much water as she should.  Denies any blood in her stool.   -She had a upper Endoscopy in 09/2014:Sessile gastric body polyps.  She was positive for H. pylori.  Per chart review the clinic was not able to get in touch with her over the phone but sent a letter informing her of this.  Patient does not recall getting treated for H. Pylori. -She also had a colonoscopy in 2016 which was unremarkable. -She is not taking any medications for her reflux other than as needed Pepto-Bismol.  ROS: See HPI.  [place]:  PMH: HTN Asthma Allergic Rhinitis Seborrhic Keratosis Prediabetes HLD History of Lung Cancer  Cervical Stenosis of Spinal Canal  PHYSICAL EXAM: BP 118/74   Pulse 88   Temp 98.5 F (36.9 C) (Oral)   Wt 183 lb (83 kg)   SpO2 99%   BMI 29.54 kg/m  GEN: NAD HEENT: neck supple, EOMI, sclera clear CV: RRR, systolic murmur PULM: CTAB, normal effort ABD: Soft,  nontender, nondistended, NABS, no organomegaly SKIN: No rash or cyanosis; warm and well-perfused EXTR: No lower extremity edema or calf tenderness PSYCH: Mood and affect euthymic, normal rate and volume of speech NEURO: Awake, alert, no focal deficits grossly, normal speech   ASSESSMENT/PLAN:  Health maintenance:  -Reports that she had a Tdap done at [facility].  She is to try to get records and have pharmacy fax it to [place].  Abdominal discomfort, epigastric: Physical exam is unremarkable.  Unlikely this is a peptic ulcer disease.  She does have a history of H. Pylori in 2016 and she does not recall getting treated for this therefore will be test with a urea breath test today.  If this is negative then we can do a trial of Zantac or PPI.  Her episode of getting food stuck in her throat is likely due to her ill fitting dentures causing her to not be able to chew her food.  She had a EGD completed in 2016 for dysphasia per chart review which showed normal esophagus. -  H. pylori breath test  Constipation:  Discussed importance of adequate water intake, fiber intake and physical activity.  Will prescribe Colace 100 mg twice daily.  She also has MiraLAX at home that she is currently not using.  I asked her  that she try Colace first MiraLAX if this does not help.  No red flags noted.  Additionally she had a colonoscopy in 2016 which was normal.   Smiley Houseman, MD PGY [place]

## 2016-12-28 ENCOUNTER — Ambulatory Visit (INDEPENDENT_AMBULATORY_CARE_PROVIDER_SITE_OTHER): Payer: Medicare Other | Admitting: Internal Medicine

## 2016-12-28 ENCOUNTER — Other Ambulatory Visit: Payer: Self-pay

## 2016-12-28 ENCOUNTER — Encounter: Payer: Self-pay | Admitting: Internal Medicine

## 2016-12-28 VITALS — BP 118/74 | HR 88 | Temp 98.5°F | Wt 183.0 lb

## 2016-12-28 DIAGNOSIS — R1013 Epigastric pain: Secondary | ICD-10-CM

## 2016-12-28 DIAGNOSIS — K5900 Constipation, unspecified: Secondary | ICD-10-CM

## 2016-12-28 MED ORDER — DOCUSATE SODIUM 100 MG PO CAPS: 100.0000 mg | ORAL_CAPSULE | Freq: Two times a day (BID) | ORAL | 0 refills | Status: DC

## 2016-12-28 NOTE — Patient Instructions (Signed)
Thank you for coming in .   Let's try Colace 1 tablet twice a day for constipation. If you are still constipated in a week, then you can try miralax which you have at home  We will test for H.Pylori today.

## 2016-12-30 LAB — H. PYLORI BREATH TEST: H pylori Breath Test: NEGATIVE

## 2016-12-31 ENCOUNTER — Telehealth: Payer: Self-pay | Admitting: Internal Medicine

## 2016-12-31 NOTE — Telephone Encounter (Signed)
Attempted to call patient. Phone kept ringing then stopped. No voicemail. Will try again later.

## 2017-01-03 ENCOUNTER — Telehealth: Payer: Self-pay | Admitting: Internal Medicine

## 2017-01-03 NOTE — Telephone Encounter (Signed)
Attempted to call again to discuss negative urea breath test for h. Pylori. Phone continued to ring without answer and without going to voicemail.   Will send letter.

## 2017-01-12 ENCOUNTER — Telehealth: Payer: Self-pay | Admitting: Internal Medicine

## 2017-01-12 NOTE — Telephone Encounter (Signed)
Returned patient's call and let her know that her H. Pylori test was negative.

## 2017-01-12 NOTE — Telephone Encounter (Signed)
Pt called concerning her test results and would like a call back with her test results. She's worried about her results. She said she will be waiting by the phone so she can answer. Please advise

## 2017-01-16 ENCOUNTER — Other Ambulatory Visit: Payer: Self-pay | Admitting: Internal Medicine

## 2017-01-16 DIAGNOSIS — I10 Essential (primary) hypertension: Secondary | ICD-10-CM

## 2017-01-17 ENCOUNTER — Other Ambulatory Visit: Payer: Self-pay | Admitting: Internal Medicine

## 2017-01-17 ENCOUNTER — Telehealth: Payer: Self-pay | Admitting: Internal Medicine

## 2017-01-17 DIAGNOSIS — I10 Essential (primary) hypertension: Secondary | ICD-10-CM

## 2017-01-17 MED ORDER — LOSARTAN POTASSIUM 50 MG PO TABS: 50.0000 mg | ORAL_TABLET | Freq: Every day | ORAL | 0 refills | Status: DC

## 2017-01-17 NOTE — Telephone Encounter (Signed)
Pt said that her pharmacy has been calling for a refill on her Losartan. She really needs this. jw

## 2017-01-17 NOTE — Progress Notes (Signed)
Have sent in refill for Losartan.   Phill Myron, D.O. 01/17/2017, 2:11 PM PGY-3, [place]

## 2017-01-27 ENCOUNTER — Ambulatory Visit: Payer: Medicare Other | Admitting: Internal Medicine

## 2017-01-28 ENCOUNTER — Encounter (HOSPITAL_COMMUNITY): Payer: Self-pay | Admitting: Emergency Medicine

## 2017-01-28 ENCOUNTER — Ambulatory Visit (HOSPITAL_COMMUNITY)
Admission: EM | Admit: 2017-01-28 | Discharge: 2017-01-28 | Disposition: A | Discharge status: Home / Self Care | Payer: Medicare Other | Attending: Emergency Medicine | Admitting: Emergency Medicine

## 2017-01-28 DIAGNOSIS — H6983 Other specified disorders of Eustachian tube, bilateral: Secondary | ICD-10-CM

## 2017-01-28 DIAGNOSIS — H9203 Otalgia, bilateral: Secondary | ICD-10-CM | POA: Diagnosis not present

## 2017-01-28 DIAGNOSIS — R0982 Postnasal drip: Secondary | ICD-10-CM

## 2017-01-28 MED ORDER — CETIRIZINE HCL 10 MG PO TABS: 10.0000 mg | ORAL_TABLET | Freq: Every day | ORAL | 0 refills | Status: DC

## 2017-01-28 NOTE — ED Triage Notes (Signed)
PT C/O: having trouble swallowing... Reports new dentures and it doesn't feel right  ONSET: 4 days  SX ALSO INCLUDE: constantly having to clear throat and having trouble chewing her food.   DENIES:   TAKING MEDS: none   A&O x4... NAD... Ambulatory

## 2017-01-28 NOTE — Discharge Instructions (Signed)
You have drainage in the back of your throat which is causing you tap to clear your throat and also blocking the eustachian tubes which causes pressure and pain in your ears. Take the medication that has been called in for you. Tylenol every 4 hours as needed, ibuprofen every 6 hours for pain.

## 2017-02-03 ENCOUNTER — Encounter: Payer: Self-pay | Admitting: Internal Medicine

## 2017-02-03 ENCOUNTER — Ambulatory Visit (INDEPENDENT_AMBULATORY_CARE_PROVIDER_SITE_OTHER): Payer: Medicare Other | Admitting: Internal Medicine

## 2017-02-03 ENCOUNTER — Other Ambulatory Visit: Payer: Self-pay

## 2017-02-03 VITALS — BP 140/78 | HR 54 | Temp 98.1°F | Ht 66.0 in | Wt 189.0 lb

## 2017-02-03 DIAGNOSIS — R12 Heartburn: Secondary | ICD-10-CM

## 2017-02-03 DIAGNOSIS — I10 Essential (primary) hypertension: Secondary | ICD-10-CM

## 2017-02-03 DIAGNOSIS — H9203 Otalgia, bilateral: Secondary | ICD-10-CM | POA: Diagnosis not present

## 2017-02-03 MED ORDER — RANITIDINE HCL 150 MG PO TABS: 150.0000 mg | ORAL_TABLET | Freq: Two times a day (BID) | ORAL | 1 refills | Status: DC

## 2017-02-03 MED ORDER — OXYMETAZOLINE HCL 0.05 % NA SOLN: 1.0000 | Freq: Two times a day (BID) | NASAL | 0 refills | Status: DC

## 2017-02-03 NOTE — Progress Notes (Signed)
Subjective:    Susan Ingram - 76 y.o. female MRN [id number]  Date of birth: Aug 19, 1940  HPI  Susan Ingram is here for a variety of complaints.  Ear Pain: Patient reports 1 week of bilateral ear pain. It varies which ear hurts more at any given time. She went to Urgent Care 5 days ago and was told to continue her Zyrtec and Flonase for presumed eustachian tube dysfunction. She reports compliance with these medications. She endorses lots of nasal congestion and sensation of something dripping down her throat. She denies fevers or ear drainage.   Epigastric Pain: Patient reports epigastric burning after eating and sensation of needing to burp frequently. She has tried Gas-X without relief. She denies pain with swallowing. No nausea or vomiting after eating. Has not tried any antacids.   HTN: Patient taking Losartan and HCTZ currently. She wants to discontinue Losartan because it has made her feel "sick on her stomach". She has taking medication with and without food and it does not make a difference. She monitors her BP at home and reports home readings have all been <140/90 but does not have a log with her today.   -  reports that she quit smoking about 23 years ago. she has never used smokeless tobacco. - Review of Systems: Per HPI. - Past Medical History: Patient Active Problem List   Diagnosis Date Noted  . Eustachian tube dysfunction 07/23/2016  . Depressed mood 02/11/2016  . Seborrheic keratosis 09/03/2015  . Cervical stenosis of spinal canal 09/03/2015  . Prediabetes 04/22/2015  . 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 Allergic rhinitis 04/22/2015  . Heartburn 06/18/2014  . Hyperlipidemia 01/02/2014  . Throat clearing 09/15/2013  . Dizzy spells 08/11/2013  . Hypertension 02/18/2012  . Asthma 02/18/2012  . History of vitamin D deficiency 02/18/2012  . History of lung cancer 11/24/2010   - Medications: reviewed and updated   Objective:   Physical Exam BP 140/78    Pulse (!) 54   Temp 98.1 F (36.7 C) (Oral)   Ht 5\' 6"  (1.676 m)   Wt 189 lb (85.7 kg)   SpO2 98%   BMI 30.51 kg/m  Gen: NAD, alert, cooperative with exam, well-appearing HEENT: NCAT, PERRL, clear conjunctiva, oropharynx clear, supple neck, TMs normal bilaterally without cerumen impaction, no pain to palpation of pinna, no erythema or tenderness over mastoid processes  CV: RRR, good S1/S2, no murmur Resp: CTABL, no wheezes, non-labored Abd: Soft, NTND, +BS   Assessment & Plan:   1. Heartburn Suspect symptoms related to GERD. Will attempt trial of Ranitidine. Abdominal exam is benign. She had a EGD completed in 2016 for dysphasia per chart review which showed normal esophagus. H. Pylori test in Nov was negative.   2. Essential hypertension Will discontinue Losartan as patient has had infrequent adherence to medication due to side effects. Would like patient to be on Ace or Arb for renal protection in setting of DM. However, Lisinopril was discontinued recently due to patient associating medication with weight gain. Blood pressure is borderline today. Has had intolerance to Amlodipine in the past due to leg edema. Will continue monotherapy with HCTZ for now. Have instructed patient to monitor BP daily and notify if above 140/90. Return in 2 weeks for BP follow up.   3. Otalgia of both ears Suspect related to eustachian tube dysfunction. No signs of AOM or mastoiditis on exam. Have recommended 3 day course of Afrin given report of nasal congestion and postnasal drip.  Patient to continue Flonase and Zyrtec. She is very frustrated about this 1 week of ear pain and having already gone to UC and [facility] for care. Referral to ENT placed so that if patient continues to have discomfort she can be seen there for evaluation.    Phill Myron, D.O. 02/03/2017, 2:22 PM PGY-3, [place]

## 2017-02-03 NOTE — Patient Instructions (Signed)
Stop taking the Losartan and continue to monitor your blood pressure. Call us if your numbers are consistently higher than 140/90.   I have prescribed Afrin for your ear pain. This is a nasal spray that you can use for 3 days in a row. Continue your Flonase and Zyrtec. I have also put in a referral to ENT if you need to see them.   I have prescribed Ranitidine for your heartburn.   Take Care,   Dr. Juleen China

## 2017-02-10 ENCOUNTER — Ambulatory Visit (HOSPITAL_COMMUNITY)
Admission: EM | Admit: 2017-02-10 | Discharge: 2017-02-10 | Disposition: A | Discharge status: Home / Self Care | Payer: Medicare Other

## 2017-02-11 ENCOUNTER — Encounter (HOSPITAL_COMMUNITY): Payer: Self-pay | Admitting: Family Medicine

## 2017-02-11 ENCOUNTER — Ambulatory Visit (HOSPITAL_COMMUNITY)
Admission: EM | Admit: 2017-02-11 | Discharge: 2017-02-11 | Disposition: A | Discharge status: Home / Self Care | Payer: Medicare Other | Attending: Internal Medicine | Admitting: Internal Medicine

## 2017-02-11 DIAGNOSIS — J0140 Acute pansinusitis, unspecified: Secondary | ICD-10-CM

## 2017-02-11 DIAGNOSIS — H9203 Otalgia, bilateral: Secondary | ICD-10-CM

## 2017-02-11 MED ORDER — AMOXICILLIN-POT CLAVULANATE 875-125 MG PO TABS: 1.0000 | ORAL_TABLET | Freq: Two times a day (BID) | ORAL | 0 refills | Status: DC

## 2017-02-11 MED ORDER — MOMETASONE FUROATE 50 MCG/ACT NA SUSP: 2.0000 | Freq: Every day | NASAL | 0 refills | Status: DC

## 2017-02-11 NOTE — Discharge Instructions (Signed)
Augmentin for sinusitis.  Start Flonase, and try Nasonex to see if you can get more relief.  Continue Zyrtec for nasal congestion. Tessalon for cough. Start flonase, zyrtec-D for nasal congestion. You can use over the counter nasal saline rinse such as neti pot for nasal congestion. Keep hydrated, your urine should be clear to pale yellow in color. Tylenol/motrin for fever and pain.  If symptoms do not improving, he may have to follow-up with the ENT for further evaluation.

## 2017-02-11 NOTE — ED Provider Notes (Signed)
[facility]    CSN: [id number] Arrival date & time: 02/11/17  1111     History   Chief Complaint Chief Complaint  Patient presents with  . Otalgia    HPI Susan Ingram is a 76 y.o. female.   76 year old female comes in with continued bilateral ear pain.  Patient states this has been going on for 2 weeks, she was seen here about a week ago, and was told about possible eustachian tube dysfunction.  She has tried to get in with her primary care without success.  She states she has been using Flonase nasal spray, Tylenol for pain, Zyrtec without relief.  She has continued nasal congestion or rhinorrhea.  Denies fever, chills, night sweats.  Denies cough, chest pain, shortness of breath, wheezing.  Does use cotton swabs.  Denies recent altitude change.  Former smoker.      Past Medical History:  Diagnosis Date  . Arthritis    RIGHT KNEE  . Asthma   . Cancer ([place])    lung  . Eczema   . Helicobacter pylori gastritis 10/08/2014  . Hypercholesterolemia   . Hypertension     Patient Active Problem List   Diagnosis Date Noted  . Eustachian tube dysfunction 07/23/2016  . Depressed mood 02/11/2016  . Seborrheic keratosis 09/03/2015  . Cervical stenosis of spinal canal 09/03/2015  . Prediabetes 04/22/2015  . 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 Allergic rhinitis 04/22/2015  . Heartburn 06/18/2014  . Hyperlipidemia 01/02/2014  . Throat clearing 09/15/2013  . Dizzy spells 08/11/2013  . Hypertension 02/18/2012  . Asthma 02/18/2012  . History of vitamin D deficiency 02/18/2012  . History of lung cancer 11/24/2010    Past Surgical History:  Procedure Laterality Date  . Bronchoscopy, left video-assisted thoracoscopy, wedge  05/23/2008  . Left thyroid lobectomy, frozen section.  10/11/2002  . VAGINAL HYSTERECTOMY      OB History    No data available       Home Medications    Prior to Admission medications   Medication Sig Start Date End Date Taking?  Authorizing Provider  acetaminophen (TYLENOL) 500 MG tablet Take 1,000 mg by mouth every 6 (six) hours as needed for mild pain.     [provider]  albuterol (PROAIR HFA) 108 (90 Base) MCG/ACT inhaler Inhale 2 puffs into the lungs every 6 (six) hours as needed for wheezing or shortness of breath. 10/25/16   Nicolette Bang, DO  amoxicillin-clavulanate (AUGMENTIN) 875-125 MG tablet Take 1 tablet by mouth every 12 (twelve) hours. 02/11/17   Ok Edwards, PA-C  aspirin EC 81 MG tablet Take 1 tablet (81 mg total) by mouth daily. 12/09/15   Nicolette Bang, DO  atorvastatin (LIPITOR) 40 MG tablet Take 1 tablet (40 mg total) by mouth daily. 12/09/15   Nicolette Bang, DO  cetirizine (ZYRTEC) 10 MG tablet Take 1 tablet (10 mg total) by mouth daily. 01/28/17   Janne Napoleon, NP  docusate sodium (COLACE) 100 MG capsule Take 1 capsule (100 mg total) 2 (two) times daily by mouth. 12/28/16   Smiley Houseman, MD  fluticasone (FLONASE) 50 MCG/ACT nasal spray Place 2 sprays into both nostrils daily. 03/10/16   Nicolette Bang, DO  hydrochlorothiazide (HYDRODIURIL) 25 MG tablet Take 1 tablet (25 mg total) by mouth daily. 12/16/16   Nicolette Bang, DO  mometasone (NASONEX) 50 MCG/ACT nasal spray Place 2 sprays into the nose daily. 02/11/17   Ok Edwards, PA-C  oxymetazoline (AFRIN NASAL SPRAY) 0.05 % nasal spray Place 1 spray into both nostrils 2 (two) times daily. For three days. 02/03/17   Nicolette Bang, DO  ranitidine (ZANTAC) 150 MG tablet Take 1 tablet (150 mg total) by mouth 2 (two) times daily. 02/03/17   Nicolette Bang, DO    Family History Family History  Problem Relation Age of Onset  . Alzheimer's disease Father   . Early death Mother   . Hypertension Mother   . Stroke Mother   . Diabetes Brother     Social History Social History   Tobacco Use  . Smoking status: Former Smoker    Last attempt to quit: 04/15/1993     Years since quitting: 23.8  . Smokeless tobacco: Never Used  Substance Use Topics  . Alcohol use: No  . Drug use: No     Allergies   Amlodipine; Maxzide [triamterene-hctz]; and Toprol xl [metoprolol tartrate]   Review of Systems Review of Systems  Reason unable to perform ROS: See HPI as above.     Physical Exam Triage Vital Signs ED Triage Vitals  Enc Vitals Group     BP 02/11/17 1153 (!) 153/68     Pulse Rate 02/11/17 1153 (!) 55     Resp 02/11/17 1153 18     Temp 02/11/17 1153 98.6 F (37 C)     Temp src --      SpO2 02/11/17 1153 100 %     Weight --      Height --      Head Circumference --      Peak Flow --      Pain Score 02/11/17 1151 6     Pain Loc --      Pain Edu? --      Excl. in [place]? --    No data found.  Updated Vital Signs BP (!) 153/68   Pulse (!) 55   Temp 98.6 F (37 C)   Resp 18   SpO2 100%   Physical Exam  Constitutional: She is oriented to person, place, and time. She appears well-developed and well-nourished. No distress.  HENT:  Head: Normocephalic and atraumatic.  Right Ear: Tympanic membrane, external ear and ear canal normal. Tympanic membrane is not erythematous and not bulging.  Left Ear: Tympanic membrane, external ear and ear canal normal. Tympanic membrane is not erythematous and not bulging.  Nose: Mucosal edema present. Right sinus exhibits maxillary sinus tenderness. Right sinus exhibits no frontal sinus tenderness. Left sinus exhibits maxillary sinus tenderness. Left sinus exhibits no frontal sinus tenderness.  Mouth/Throat: Uvula is midline, oropharynx is clear and moist and mucous membranes are normal.  Eyes: Conjunctivae are normal. Pupils are equal, round, and reactive to light.  Neck: Normal range of motion. Neck supple.  Cardiovascular: Normal rate, regular rhythm and normal heart sounds. Exam reveals no gallop and no friction rub.  No murmur heard. Pulmonary/Chest: Effort normal and breath sounds normal. She has no  decreased breath sounds. She has no wheezes. She has no rhonchi. She has no rales.  Lymphadenopathy:    She has no cervical adenopathy.  Neurological: She is alert and oriented to person, place, and time.  Skin: Skin is warm and dry.  Psychiatric: She has a normal mood and affect. Her behavior is normal. Judgment normal.     UC Treatments / Results  Labs (all labs ordered are listed, but only abnormal results are displayed) Labs Reviewed - No data to display  EKG  EKG Interpretation None       Radiology No results found.  Procedures Procedures (including critical care time)  Medications Ordered in UC Medications - No data to display   Initial Impression / Assessment and Plan / UC Course  I have reviewed the triage vital signs and the nursing notes.  Pertinent labs & imaging results that were available during my care of the patient were reviewed by me and considered in my medical decision making (see chart for details).    Will treat for sinusitis with Augmentin.  Given patient has used Flonase for the past couple of years, will switch to Nasonex for trial.  Continue OTC antihistamines for nasal congestion.  Other symptomatic treatment discussed.  Patient to follow-up with ENT if symptoms not improving for further evaluation and treatment needed.  Return precautions given.  Final Clinical Impressions(s) / UC Diagnoses   Final diagnoses:  Acute non-recurrent pansinusitis  Otalgia of both ears    ED Discharge Orders        Ordered    mometasone (NASONEX) 50 MCG/ACT nasal spray  Daily     02/11/17 1223    amoxicillin-clavulanate (AUGMENTIN) 875-125 MG tablet  Every 12 hours     02/11/17 1223        Ok Edwards, PA-C 02/11/17 1230

## 2017-02-11 NOTE — ED Triage Notes (Signed)
Pt here for bilateral ear pain.

## 2017-02-15 HISTORY — PX: WRIST SURGERY: SHX841

## 2017-03-22 ENCOUNTER — Ambulatory Visit: Payer: Medicare Other | Admitting: Internal Medicine

## 2017-03-24 ENCOUNTER — Encounter: Payer: Self-pay | Admitting: Internal Medicine

## 2017-03-24 ENCOUNTER — Ambulatory Visit (INDEPENDENT_AMBULATORY_CARE_PROVIDER_SITE_OTHER): Payer: Medicare Other | Admitting: Internal Medicine

## 2017-03-24 VITALS — BP 122/80 | HR 70 | Temp 97.5°F | Ht 66.0 in | Wt 183.4 lb

## 2017-03-24 DIAGNOSIS — R7303 Prediabetes: Secondary | ICD-10-CM | POA: Diagnosis not present

## 2017-03-24 DIAGNOSIS — I10 Essential (primary) hypertension: Secondary | ICD-10-CM

## 2017-03-24 DIAGNOSIS — Z23 Encounter for immunization: Secondary | ICD-10-CM

## 2017-03-24 LAB — POCT GLYCOSYLATED HEMOGLOBIN (HGB A1C): Hemoglobin A1C: 6.5

## 2017-03-24 MED ORDER — CHLORTHALIDONE 25 MG PO TABS: 25.0000 mg | ORAL_TABLET | Freq: Every day | ORAL | 0 refills | Status: DC

## 2017-03-24 NOTE — Assessment & Plan Note (Signed)
No significant change in A1c. 6.5% today from 6.4%. Patient technically diabetic by definition now but given age do not need tight glycemic control. Will continue to monitor q6 months. Continued education on diet and exercise.

## 2017-03-24 NOTE — Assessment & Plan Note (Signed)
Well controlled today. Patient insistent that regimen be changed due to concern that HCTZ and Lisinopril making her sick. Had actually discontinued Lisinopril at last visit in Dec 2018 but apparently patient continued to take it. She has been on multiple medications in the past including Losartan, Triamterene, Metoprolol that she has insisted on being changed due to various intolerances. Will start Chlorthalidone today. Return in 2 weeks for RN BP check.

## 2017-03-24 NOTE — Patient Instructions (Addendum)
Stop taking the Lisinopril and Hydrochlorothiazide. I have prescribed Chlorthalidone for your high blood pressure today. Please make an appointment for nurse visit in two weeks for a blood pressure check.

## 2017-03-24 NOTE — Progress Notes (Signed)
   Subjective:    Susan Ingram - 77 y.o. female MRN [id number]  Date of birth: 02-09-1941  HPI  MERIDIAN SCHERGER is here for follow up of chronic medical conditions.   HTN: Patient insistent that HCTZ and Lisinopril are making her nausea and sick. I had discontinued Lisinopril at prior office visit at patients request but apparently she continued to take it. She insists that I change her BP medication to something else today. No headaches, chest pain, or dyspnea.   Pre-Diabetes: Requests A1c today. Concerned that she has been eating too many sugary foods.   -  reports that she quit smoking about 23 years ago. she has never used smokeless tobacco. - Review of Systems: Per HPI. - Past Medical History: Patient Active Problem List   Diagnosis Date Noted  . Eustachian tube dysfunction 07/23/2016  . Depressed mood 02/11/2016  . Seborrheic keratosis 09/03/2015  . Cervical stenosis of spinal canal 09/03/2015  . Prediabetes 04/22/2015  . 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 Allergic rhinitis 04/22/2015  . Heartburn 06/18/2014  . Hyperlipidemia 01/02/2014  . Throat clearing 09/15/2013  . Dizzy spells 08/11/2013  . Hypertension 02/18/2012  . Asthma 02/18/2012  . History of vitamin D deficiency 02/18/2012  . History of lung cancer 11/24/2010   - Medications: reviewed and updated   Objective:   Physical Exam BP 122/80 (BP Location: Left Arm, Patient Position: Sitting, Cuff Size: Normal)   Pulse 70   Temp (!) 97.5 F (36.4 C) (Oral)   Ht 5\' 6"  (1.676 m)   Wt 183 lb 6.4 oz (83.2 kg)   SpO2 99%   BMI 29.60 kg/m  Gen: NAD, alert, cooperative with exam, well-appearing HEENT: NCAT, PERRL, clear conjunctiva, oropharynx clear, supple neck CV: RRR, good S1/S2, no murmur, no edema, capillary refill brisk  Resp: CTABL, no wheezes, non-labored Abd: SNTND, BS present, no guarding or organomegaly Skin: no rashes, normal turgor  Neuro: no gross deficits.  Psych: good insight, alert  and oriented     Assessment & Plan:   Hypertension Well controlled today. Patient insistent that regimen be changed due to concern that HCTZ and Lisinopril making her sick. Had actually discontinued Lisinopril at last visit in Dec 2018 but apparently patient continued to take it. She has been on multiple medications in the past including Losartan, Triamterene, Metoprolol that she has insisted on being changed due to various intolerances. Will start Chlorthalidone today. Return in 2 weeks for RN BP check.   Prediabetes No significant change in A1c. 6.5% today from 6.4%. Patient technically diabetic by definition now but given age do not need tight glycemic control. Will continue to monitor q6 months. Continued education on diet and exercise.   Need for tetanus booster - Tdap vaccine greater than or equal to 7yo IM  Phill Myron, D.O. 03/24/2017, 11:38 AM PGY-3, [place]

## 2017-03-30 DIAGNOSIS — E119 Type 2 diabetes mellitus without complications: Secondary | ICD-10-CM | POA: Diagnosis not present

## 2017-03-30 DIAGNOSIS — Z961 Presence of intraocular lens: Secondary | ICD-10-CM | POA: Diagnosis not present

## 2017-03-30 DIAGNOSIS — H40013 Open angle with borderline findings, low risk, bilateral: Secondary | ICD-10-CM | POA: Diagnosis not present

## 2017-04-15 ENCOUNTER — Other Ambulatory Visit: Payer: Self-pay | Admitting: Family Medicine

## 2017-04-15 ENCOUNTER — Other Ambulatory Visit: Payer: Self-pay | Admitting: Internal Medicine

## 2017-04-15 DIAGNOSIS — Z1231 Encounter for screening mammogram for malignant neoplasm of breast: Secondary | ICD-10-CM

## 2017-04-21 ENCOUNTER — Other Ambulatory Visit: Payer: Self-pay | Admitting: Internal Medicine

## 2017-04-21 DIAGNOSIS — I10 Essential (primary) hypertension: Secondary | ICD-10-CM

## 2017-04-21 NOTE — Telephone Encounter (Signed)
Needs refill on chlorthalidone.  [facility]

## 2017-04-22 MED ORDER — CHLORTHALIDONE 25 MG PO TABS: 25.0000 mg | ORAL_TABLET | Freq: Every day | ORAL | 3 refills | Status: DC

## 2017-04-26 DIAGNOSIS — M26621 Arthralgia of right temporomandibular joint: Secondary | ICD-10-CM | POA: Diagnosis not present

## 2017-04-26 DIAGNOSIS — H9203 Otalgia, bilateral: Secondary | ICD-10-CM | POA: Diagnosis not present

## 2017-05-19 ENCOUNTER — Ambulatory Visit
Admission: RE | Admit: 2017-05-19 | Discharge: 2017-05-19 | Disposition: A | Discharge status: Home / Self Care | Payer: Medicare Other | Source: Ambulatory Visit | Attending: *Deleted | Admitting: *Deleted

## 2017-05-19 DIAGNOSIS — Z1231 Encounter for screening mammogram for malignant neoplasm of breast: Secondary | ICD-10-CM

## 2017-05-26 ENCOUNTER — Other Ambulatory Visit: Payer: Self-pay

## 2017-05-30 ENCOUNTER — Other Ambulatory Visit: Payer: Self-pay

## 2017-05-30 MED ORDER — FLUTICASONE PROPIONATE 50 MCG/ACT NA SUSP: 2.0000 | Freq: Every day | NASAL | 6 refills | Status: DC

## 2017-05-31 MED ORDER — FLUTICASONE PROPIONATE 50 MCG/ACT NA SUSP: 2.0000 | Freq: Every day | NASAL | 6 refills | Status: DC

## 2017-06-13 ENCOUNTER — Encounter: Payer: Self-pay | Admitting: Internal Medicine

## 2017-06-13 ENCOUNTER — Other Ambulatory Visit: Payer: Self-pay

## 2017-06-13 ENCOUNTER — Ambulatory Visit (INDEPENDENT_AMBULATORY_CARE_PROVIDER_SITE_OTHER): Payer: Medicare Other | Admitting: Internal Medicine

## 2017-06-13 VITALS — BP 122/68 | HR 73 | Temp 97.9°F | Ht 66.0 in | Wt 178.0 lb

## 2017-06-13 DIAGNOSIS — Z0000 Encounter for general adult medical examination without abnormal findings: Secondary | ICD-10-CM

## 2017-06-13 DIAGNOSIS — I10 Essential (primary) hypertension: Secondary | ICD-10-CM | POA: Diagnosis not present

## 2017-06-13 DIAGNOSIS — R7303 Prediabetes: Secondary | ICD-10-CM

## 2017-06-13 LAB — POCT GLYCOSYLATED HEMOGLOBIN (HGB A1C): Hemoglobin A1C: 6.4

## 2017-06-13 NOTE — Patient Instructions (Signed)
Your A1c result today is 6.4%. Keep up the good work with walking! I am happy to hear that you found a job that you are enjoying.   Remember, stop taking the Hydrochlorothiazide and only take the Chlorthalidone. That may have been making you feel a little light-headed taking both medications.   Take the prescription for your shingles shot and let me know if you have any problems.   Take Care,   Dr. Juleen China

## 2017-06-13 NOTE — Progress Notes (Signed)
   Subjective:    Susan Ingram - 77 y.o. female MRN [id number]  Date of birth: 27-Jun-1940  HPI  Susan Ingram is here for follow up of chronic medical conditions.  Pre-Diabetes: Patient requests A1c today. She wants to make sure blood sugars have remained stable. Does not monitor at home. 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   HTN: Has questions about what BP medications she should be taking. Currently taking both HCTZ and Chlorthalidone. Felt lightheaded yesterday. No headaches, vision changes, chest pain or dyspnea.    -  reports that she quit smoking about 24 years ago. She has never used smokeless tobacco. - Review of Systems: Per HPI. - Past Medical History: Patient Active Problem List   Diagnosis Date Noted  . Eustachian tube dysfunction 07/23/2016  . Depressed mood 02/11/2016  . Seborrheic keratosis 09/03/2015  . Cervical stenosis of spinal canal 09/03/2015  . Prediabetes 04/22/2015  . 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 Allergic rhinitis 04/22/2015  . Heartburn 06/18/2014  . Hyperlipidemia 01/02/2014  . Throat clearing 09/15/2013  . Dizzy spells 08/11/2013  . Hypertension 02/18/2012  . Asthma 02/18/2012  . History of vitamin D deficiency 02/18/2012  . History of lung cancer 11/24/2010   - Medications: reviewed and updated   Objective:   Physical Exam BP 122/68   Pulse 73   Temp 97.9 F (36.6 C) (Oral)   Ht 5\' 6"  (1.676 m)   Wt 178 lb (80.7 kg)   SpO2 91%   BMI 28.73 kg/m  Gen: NAD, alert, cooperative with exam, well-appearing HEENT: NCAT, PERRL, clear conjunctiva, oropharynx clear, supple neck CV: RRR, good S1/S2, no murmur, no edema, capillary refill brisk  Resp: CTABL, no wheezes, non-labored Abd: SNTND, BS present, no guarding or organomegaly Skin: no rashes, normal turgor  Neuro: no gross deficits.  Psych: good insight, alert and  oriented     Assessment & Plan:   1. Prediabetes Glucose well controlled with A1c result of 6.4%. Encouraged continued walking regimen and healthy eating habits. Repeat A1c in 6-12 months.  - HgB A1c  2. Essential hypertension Well controlled today. Had discontinued HCTZ and started Chlorthalidone at last office visit Feb 2019 due to concern that HCTZ was making her nauseous. Has apparently continued to take both medications. Re-emphasized appropriate medication regimen. No red flag symptoms. Follow up in 6 months.   3. Healthcare maintenance Paper Rx given for Shingles vaccination.    Phill Myron, D.O. 06/13/2017, 8:56 AM PGY-3, [place]

## 2017-07-28 ENCOUNTER — Ambulatory Visit: Payer: Medicare Other | Admitting: Internal Medicine

## 2017-08-04 ENCOUNTER — Ambulatory Visit: Payer: Medicare Other | Admitting: Internal Medicine

## 2017-08-09 ENCOUNTER — Other Ambulatory Visit: Payer: Self-pay

## 2017-08-09 ENCOUNTER — Ambulatory Visit (INDEPENDENT_AMBULATORY_CARE_PROVIDER_SITE_OTHER): Payer: Medicare Other | Admitting: Internal Medicine

## 2017-08-09 ENCOUNTER — Encounter: Payer: Self-pay | Admitting: Internal Medicine

## 2017-08-09 VITALS — BP 132/82 | HR 57 | Temp 98.2°F | Ht 66.0 in | Wt 178.2 lb

## 2017-08-09 DIAGNOSIS — R200 Anesthesia of skin: Secondary | ICD-10-CM | POA: Diagnosis not present

## 2017-08-09 DIAGNOSIS — R202 Paresthesia of skin: Secondary | ICD-10-CM

## 2017-08-09 DIAGNOSIS — R29818 Other symptoms and signs involving the nervous system: Secondary | ICD-10-CM

## 2017-08-09 NOTE — Patient Instructions (Signed)
We will check some labs and also imaging of your head. I will call you with these results. If everything looks good, I recommend sending you back to the neurosurgeon due to the narrowing of your spinal cord in your neck.

## 2017-08-09 NOTE — Progress Notes (Signed)
   Subjective:    Susan Ingram - 77 y.o. female MRN [id number]  Date of birth: 1940/05/20  HPI  Susan Ingram is here for right upper extremity numbness and tingling. Has been occurring for the past two months intermittenly. Patient reports it occurs average of 2-3 times per day. Has not noticed an associated trigger. Does not seem to occur always with wakening or with excessive use of that extremity. Tingling mostly occurs in all five fingers but sometimes occurs throughout the entire right arm. She has weakness at times where she drops things from her hand. She denies facial droop, other extremity weakness/numbness/tingling, speech changes, confusion, headaches, loss of balance, falls, dizziness. Denies neck pain and neck stiffness.    -  reports that she quit smoking about 24 years ago. She has never used smokeless tobacco. - Review of Systems: Per HPI. - Past Medical History: Patient Active Problem List   Diagnosis Date Noted  . Eustachian tube dysfunction 07/23/2016  . Depressed mood 02/11/2016  . Seborrheic keratosis 09/03/2015  . Cervical stenosis of spinal canal 09/03/2015  . Prediabetes 04/22/2015  . 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 Allergic rhinitis 04/22/2015  . Heartburn 06/18/2014  . Hyperlipidemia 01/02/2014  . Throat clearing 09/15/2013  . Dizzy spells 08/11/2013  . Hypertension 02/18/2012  . Asthma 02/18/2012  . History of vitamin D deficiency 02/18/2012  . History of lung cancer 11/24/2010   - Medications: reviewed and updated   Objective:   Physical Exam BP 132/82   Pulse (!) 57   Temp 98.2 F (36.8 C) (Oral)   Ht 5\' 6"  (1.676 m)   Wt 178 lb 3.2 oz (80.8 kg)   SpO2 99%   BMI 28.76 kg/m  Gen: NAD, alert, cooperative with exam, well-appearing Neuro: CN II-XII grossly intact. Strength 5/5 in all extremities. Sensation to right UE grossly intact. Gait normal. Speech clear and thought content appropriate. Negative Tinel's and Phalen's sign.     Assessment & Plan:   1. Numbness and tingling of right arm Patient with known cervical stenosis. She was never seen by neurosurgery; referral placed by prior PCP in 2017. This may be the etiology of her neuropathy, however she was previously having bilateral symptoms and she reports this is a new episode of numbness/tingling. Therefore, will obtain CT head to ensure no acute process. Will check several labs looking for etiology of peripheral neuropathy. Distribution of numbness not consistent with carpal tunnel syndrome and special tests negative. If work up is unremarkable, would re-refer to neurosurgery vs. Neurology evaluation.  - CT Head Wo Contrast; Future - Vitamin B12 - Folate - TSH - HIV antibody - Basic Metabolic Panel - CBC - RPR     Phill Myron, D.O. 08/10/2017, 11:35 AM PGY-3, [place]

## 2017-08-10 ENCOUNTER — Telehealth: Payer: Self-pay

## 2017-08-10 LAB — CBC
Hematocrit: 41 % (ref 34.0–46.6)
Hemoglobin: 13.1 g/dL (ref 11.1–15.9)
MCH: 26.8 pg (ref 26.6–33.0)
MCHC: 32 g/dL (ref 31.5–35.7)
MCV: 84 fL (ref 79–97)
Platelets: 299 10*3/uL (ref 150–450)
RBC: 4.88 x10E6/uL (ref 3.77–5.28)
RDW: 15.7 % — ABNORMAL HIGH (ref 12.3–15.4)
WBC: 6.4 10*3/uL (ref 3.4–10.8)

## 2017-08-10 LAB — HIV ANTIBODY (ROUTINE TESTING W REFLEX): HIV Screen 4th Generation wRfx: NONREACTIVE

## 2017-08-10 LAB — BASIC METABOLIC PANEL
BUN/Creatinine Ratio: 16 (ref 12–28)
BUN: 13 mg/dL (ref 8–27)
CO2: 29 mmol/L (ref 20–29)
Calcium: 10.2 mg/dL (ref 8.7–10.3)
Chloride: 97 mmol/L (ref 96–106)
Creatinine, Ser: 0.83 mg/dL (ref 0.57–1.00)
GFR calc Af Amer: 78 mL/min/{1.73_m2} (ref >59)
GFR calc non Af Amer: 68 mL/min/{1.73_m2} (ref >59)
Glucose: 98 mg/dL (ref 65–99)
Potassium: 3.8 mmol/L (ref 3.5–5.2)
Sodium: 141 mmol/L (ref 134–144)

## 2017-08-10 LAB — RPR: RPR Ser Ql: NONREACTIVE

## 2017-08-10 LAB — FOLATE: Folate: 8 ng/mL (ref >3.0)

## 2017-08-10 LAB — TSH: TSH: 1.03 u[IU]/mL (ref 0.450–4.500)

## 2017-08-10 LAB — VITAMIN B12: Vitamin B-12: 935 pg/mL (ref 232–1245)

## 2017-08-10 NOTE — Telephone Encounter (Signed)
-----   Message from Nicolette Bang, DO sent at 08/10/2017 11:45 AM EDT ----- Please call patient to let her know all labs were normal. We will follow up with her after the CT of her head is completed.   Phill Myron, D.O. 08/10/2017, 11:45 AM PGY-3, [place]

## 2017-08-10 NOTE — Telephone Encounter (Signed)
Pt informed. Very thankful for call. Ottis Stain, CMA

## 2017-08-25 ENCOUNTER — Ambulatory Visit
Admission: RE | Admit: 2017-08-25 | Discharge: 2017-08-25 | Disposition: A | Discharge status: Home / Self Care | Payer: Medicare Other | Source: Ambulatory Visit | Attending: Family Medicine | Admitting: Family Medicine

## 2017-08-25 ENCOUNTER — Other Ambulatory Visit: Payer: Self-pay | Admitting: Family Medicine

## 2017-08-25 ENCOUNTER — Other Ambulatory Visit: Payer: Medicare Other

## 2017-08-25 DIAGNOSIS — R42 Dizziness and giddiness: Secondary | ICD-10-CM | POA: Diagnosis not present

## 2017-08-25 DIAGNOSIS — R29818 Other symptoms and signs involving the nervous system: Secondary | ICD-10-CM

## 2017-08-25 DIAGNOSIS — M4802 Spinal stenosis, cervical region: Secondary | ICD-10-CM

## 2017-08-25 DIAGNOSIS — R202 Paresthesia of skin: Secondary | ICD-10-CM

## 2017-08-25 DIAGNOSIS — R200 Anesthesia of skin: Secondary | ICD-10-CM

## 2017-08-25 NOTE — Progress Notes (Signed)
Dr. Juleen China has graduated.  I reviewed the results of the blood work and the head CT, all of which are reassuring.  I informed patient.  Per Dr. Alcario Drought last note, will refer to neurosurg since labs and head CT are unremarkable.

## 2017-09-09 ENCOUNTER — Other Ambulatory Visit: Payer: Self-pay | Admitting: Family Medicine

## 2017-09-09 ENCOUNTER — Other Ambulatory Visit: Payer: Self-pay

## 2017-09-09 DIAGNOSIS — I10 Essential (primary) hypertension: Secondary | ICD-10-CM

## 2017-09-09 MED ORDER — CHLORTHALIDONE 25 MG PO TABS: 25.0000 mg | ORAL_TABLET | Freq: Every day | ORAL | 3 refills | Status: DC

## 2017-09-13 ENCOUNTER — Other Ambulatory Visit: Payer: Self-pay

## 2017-09-28 ENCOUNTER — Other Ambulatory Visit: Payer: Self-pay

## 2017-09-28 MED ORDER — LOSARTAN POTASSIUM 50 MG PO TABS: 50.0000 mg | ORAL_TABLET | Freq: Every day | ORAL | 3 refills | Status: DC

## 2017-09-29 DIAGNOSIS — R200 Anesthesia of skin: Secondary | ICD-10-CM | POA: Diagnosis not present

## 2017-09-29 DIAGNOSIS — R202 Paresthesia of skin: Secondary | ICD-10-CM | POA: Diagnosis not present

## 2017-10-27 DIAGNOSIS — G5603 Carpal tunnel syndrome, bilateral upper limbs: Secondary | ICD-10-CM | POA: Diagnosis not present

## 2017-11-10 ENCOUNTER — Other Ambulatory Visit: Payer: Self-pay

## 2017-11-10 ENCOUNTER — Encounter: Payer: Self-pay | Admitting: Family Medicine

## 2017-11-10 ENCOUNTER — Ambulatory Visit (INDEPENDENT_AMBULATORY_CARE_PROVIDER_SITE_OTHER): Payer: Medicare Other | Admitting: Family Medicine

## 2017-11-10 VITALS — BP 122/62 | HR 67 | Temp 98.7°F | Ht 66.0 in | Wt 177.8 lb

## 2017-11-10 DIAGNOSIS — E118 Type 2 diabetes mellitus with unspecified complications: Secondary | ICD-10-CM | POA: Diagnosis not present

## 2017-11-10 DIAGNOSIS — R7303 Prediabetes: Secondary | ICD-10-CM

## 2017-11-10 DIAGNOSIS — Z23 Encounter for immunization: Secondary | ICD-10-CM

## 2017-11-10 DIAGNOSIS — F331 Major depressive disorder, recurrent, moderate: Secondary | ICD-10-CM

## 2017-11-10 DIAGNOSIS — I83811 Varicose veins of right lower extremities with pain: Secondary | ICD-10-CM | POA: Diagnosis not present

## 2017-11-10 DIAGNOSIS — I10 Essential (primary) hypertension: Secondary | ICD-10-CM | POA: Diagnosis not present

## 2017-11-10 DIAGNOSIS — J309 Allergic rhinitis, unspecified: Secondary | ICD-10-CM

## 2017-11-10 DIAGNOSIS — E785 Hyperlipidemia, unspecified: Secondary | ICD-10-CM

## 2017-11-10 LAB — POCT GLYCOSYLATED HEMOGLOBIN (HGB A1C): HbA1c, POC (controlled diabetic range): 7 % (ref 0.0–7.0)

## 2017-11-10 MED ORDER — CETIRIZINE HCL 10 MG PO TABS: 10.0000 mg | ORAL_TABLET | Freq: Every day | ORAL | 0 refills | Status: DC

## 2017-11-10 MED ORDER — ATORVASTATIN CALCIUM 40 MG PO TABS: 40.0000 mg | ORAL_TABLET | Freq: Every day | ORAL | 3 refills | Status: DC

## 2017-11-10 MED ORDER — CHLORTHALIDONE 25 MG PO TABS: 25.0000 mg | ORAL_TABLET | Freq: Every day | ORAL | 3 refills | Status: DC

## 2017-11-10 NOTE — Assessment & Plan Note (Signed)
Patient reports not taking her losartan at home as it does not make her feel well.  She is currently taking her chlorthalidone.  Her blood pressure in clinic today is 122/67.  She reports that her blood pressures are well controlled at home and she is otherwise compliant with her medications.  We will discontinue losartan as patient's blood pressure appears well controlled.  In the setting of diabetes diagnosis, consider adding back ACE inhibitor or ARB for kidney protection.

## 2017-11-10 NOTE — Patient Instructions (Addendum)
Dear Susan Ingram,   It was nice to meet you today! I am glad you came in for your concerns. This document serves as a "wrap-up" to all that we discussed today and is listed as follows:    Feeling bad  The questionnaire that we did in clinic today, shows that you might be suffering from some depression.  We talked about medications that you could take in combination with behavioral therapy, such as talk therapy.  You have benefited in the past with talking with a therapist.  I would like you to see Dr. Gwenlyn Saran and start following with her.   Painful Veins  This is likely due to venous insufficiency.  Over time, the walls of your veins become weaker and it causes the walls to become distended and larger.  I am going to refer you to a vascular surgeon to further assess this issue.   Someone from their office will call you and follow up about scheduling an appointment.   Blood sugar  We tested your A1c today.  You have been borderline prediabetic with your most recent A1c's.  I will call you with your results.   High blood pressure   You have been getting bad side effects from your losartan.  Your blood pressure today looks fantastic.  I would be okay if you stop the losartan and continue your other blood pressure medications.  I will also send out a 90-day refills as requested.  I have sent in a prescription for Shingrix vaccine.  This is a 2 dose vaccine that you can get at your pharmacy.  It is to prevent shingles.  Please bring back any documentation so that we can put it into the system.   Please follow up with me in 1 month.  Remember to stop at the front desk on your way out.  I have requested a consult with Dr. Gwenlyn Saran for feeling bad and depressed. You will be contacted for appointment scheduling.     Thank you for choosing [facility] for your primary care needs and stay well!   Best,   Dr. Zettie Cooley Resident Physician, PGY-1 [facility] [phone]    Don't forget to sign up for MyChart for instant access to your health profile, labs, orders, upcoming appointments or to contact your provider with questions. Stop at the front desk on the way out for more information about how to sign up! ===================================================================================================   10 LITTLE Things To Do When You're Feeling Too Down To Do Anything  Take a shower. Even if you plan to stay in all day long and not see a soul, take a shower. It takes the most effort to hop in to the shower but once you do, you'll feel immediate results. It will wake you up and you'll be feeling much fresher (and cleaner too).  Brush and floss your teeth. Give your teeth a good brushing with a floss finish. It's a small task but it feels so good and you can check 'taking care of your health' off the list of things to do.  Do something small on your list. Most of [place] have some small thing we would like to get done (load of laundry, sew a button, email a friend). Doing one of these things will make you feel like you've accomplished something.  Drink water. Drinking water is easy right? It's also really beneficial for your health so keep a glass beside you all day and take sips  often. It gives you energy and prevents you from boredom eating.  Do some floor exercises. The last thing you want to do is exercise but it might be just the thing you need the most. Keep it simple and do exercises that involve sitting or laying on the floor. Even the smallest of exercises release chemicals in the brain that make you feel good. Yoga stretches or core exercises are going to make you feel good with minimal effort.  Make your bed. Making your bed takes a few minutes but it's productive and you'll feel relieved when it's done. An unmade bed is a huge visual reminder that you're having an unproductive day. Do it and consider it your housework for  the day.  Put on some nice clothes. Take the sweatpants off even if you don't plan to go anywhere. Put on clothes that make you feel good. Take a look in the mirror so your brain recognizes the sweatpants have been replaced with clothes that make you look great. It's an instant confidence booster.  Wash the dishes. A pile of dirty dishes in the sink is a reflection of your mood. It's possible that if you wash up the dishes, your mood will follow suit. It's worth a try.  Cook a real meal. If you have the luxury to have a "do nothing" day, you have time to make a real meal for yourself. Make a meal that you love to eat. The process is good to get you out of the funk and the food will ensure you have more energy for tomorrow.  Write out your thoughts by hand. When you hand write, you stimulate your brain to focus on the moment that you're in so make yourself comfortable and write whatever comes into your mind. Put those thoughts out on paper so they stop spinning around in your head. Those thoughts might be the very thing holding you down.

## 2017-11-10 NOTE — Assessment & Plan Note (Signed)
Patient's A1c is 7 today, increased from 6.4 in April.  Results after patient left building.  Call patient and inform her of her diagnosis.  Asked her to come in sooner to start counsel over the phone about different medications that she can be started on.  For discussed dietary modifications and lifestyle modifications.  Repeat A1c in January 2020.  Would like her to start taking statin as previously prescribed.

## 2017-11-10 NOTE — Assessment & Plan Note (Signed)
Lipid panel not addressed today.  Patient is not currently taking statin.    Next visit will get lipid panel.  Will call patient and ask her to begin statin, as it was sent to the pharmacy in the setting of her newly diagnosed diabetes

## 2017-11-10 NOTE — Assessment & Plan Note (Signed)
Patient's PHQ 9 today is 15.  Offered depression medication in combination with behavioral modification/therapy.  Patient remembers speaking to somebody in the past year and reports that it helped.  Referred patient to Dr. Gwenlyn Saran and provided her with card.  Encouraged her to call for an appointment  No prescription medications today.  Can consider fluoxetine in the future.  Follow up with patient in 1 month

## 2017-11-10 NOTE — Progress Notes (Signed)
SUBJECTIVE:  PCP: Wilber Oliphant, MD Patient ID: MRN [id number]  Date of birth: 11-21-1940  HPI Susan Ingram is a 77 y.o. female, who presents to clinic with complaints of feeling badly, painful veins on her legs, and to check her A1c.  She also has smaller complaints about a spot on her leg and a lesion on her finger, both of which have been there for several years.   #Feeling badly PHQ 9 score is 15.  Patient denies SI or feelings of harming herself.  She reports that she has had many stresses in her life right now, including her grandson who is using drugs and has been having surgery.  Her PHQ 9 today is 15.  #Painful, bulging veins in line Patient complains of bulging veins in her posterior right lower extremity.  She reports that they are painful and requests a referral today to vascular surgery.   #Prediabetes Patient requests for A1c today.  A1c has been 6.4 and April 2019, and 6.5 in January 2019.  After her visit today, lab shows that her A1c is 7.0.  Her largest concern is that she has family members who have passed away from poor sugar control.  #Neurology visit Patient reports that she had a visit with neurosurgery, Dr. Elaina Hoops Junior, MD at [facility] on [address]  She was informed that her primary care doctor would have all of her information.  And would like to look into this.  She has not received any of her results yet.  #High blood pressure Pt reports compliance with medications and home monitoring of blood pressure.  She sees the ophthalmologist every year, and her next appointment is on February 14 or 15, 2020.  She is currently on chlorthalidone and has stopped taking her losartan due to side effects.  Her blood pressure in clinic today is 122/67.  Review of Symptoms:  See HPI  HISTORY Medications, Allergies, Past Medical, Surgical, Social, and Family History Reviewed & Updated per EMR.   Pertinent Historical Findings  include:  Medications/Allergies:  Current Outpatient Medications on File Prior to Visit  Medication Sig Dispense Refill  . albuterol (PROAIR HFA) 108 (90 Base) MCG/ACT inhaler Inhale 2 puffs into the lungs every 6 (six) hours as needed for wheezing or shortness of breath. 1 Inhaler 1  . aspirin EC 81 MG tablet Take 1 tablet (81 mg total) by mouth daily. 90 tablet 3  . fluticasone (FLONASE) 50 MCG/ACT nasal spray Place 2 sprays into both nostrils daily. 16 g 6  . Tdap (BOOSTRIX) 5-2.5-18.5 LF-MCG/0.5 injection     . [DISCONTINUED] potassium chloride (K-DUR) 10 MEQ tablet Take 1 tablet (10 mEq total) by mouth daily. 30 tablet 6   No current facility-administered medications on file prior to visit.    Allergies  Allergen Reactions  . Amlodipine Swelling    LEG AND ANKLE  . Maxzide [Triamterene-Hctz] Other (See Comments)    Unsure of reaction  . Toprol Xl [Metoprolol Tartrate] Other (See Comments)    Past Medical History:   Patient Active Problem List   Diagnosis Date Noted  . Major depressive disorder, recurrent episode ([place]) 02/11/2016  . Diabetes ([place]) 04/22/2015  . Allergic rhinitis 04/22/2015  . Hyperlipidemia 01/02/2014  . Hypertension 02/18/2012  . Asthma 02/18/2012    Surgeries:  Past Surgical History:  Procedure Laterality Date  . Bronchoscopy, left video-assisted thoracoscopy, wedge  05/23/2008  . Left thyroid lobectomy, frozen section.  10/11/2002  . VAGINAL  HYSTERECTOMY      Family History:  She reports that she has a family history of diabetes and complications of diabetes in her family.  Social History:  Patient has had a lot of stressors recently, including her nephew who has just had surgery for a cyst on his tongue and is also using illicit drugs.  She is worried about her daughter who is always staying sick.  She prays every day.  reports that she quit smoking about 24 years ago. She has never used smokeless tobacco. She reports that she does not drink  alcohol or use drugs.  OBJECTIVE:  BP 122/62   Pulse 67   Temp 98.7 F (37.1 C) (Oral)   Ht 5\' 6"  (1.676 m)   Wt 177 lb 12.8 oz (80.6 kg)   SpO2 99%   BMI 28.70 kg/m   Physical Exam:  Gen: NAD, alert, non-toxic, well-nourished, well-appearing, sitting comfortably, appears younger than stated age HEENT: Normocephaic, atraumatic. Clear conjuctiva, no scleral icterus and injection.  PERRLA CV: RRR.  Normal S1-S2.   Normal capillary refill bilaterally.  Radial pulses 1+ bilaterally. No bilateral lower extremity edema. Resp: CTAB.  No wheezing, rales, abnormal lung sounds.  No increased WOB appreciated. Abd: Nontender and nondistended on palpation to all 4 quadrants.  Positive bowel sounds. Psych: Cooperative with exam. Pleasant. Makes appropriate eye contact. Extremities: Distended veins appreciated at posterior right lower extremity.  No hard masses or nodules palpated.  Veins are soft and compressible.  PHQ-9 In the past 2 weeks, how often heavy felt 0  not at all 1 (Some days) Less than 1/2 a week 2 More than 1/2 the week 3 Everyday  1. Little interest or pleasure in doing things   x   2. Feeling down, depressed, or hopeless    x  3. Trouble falling or staying asleep, or sleeping too much x     4. Feeling tired or having little energy    x  5. Poor appetite or overeating    x  6. Feeling bad about yourself-or that you are a failure or have let yourself or your family down    x  7. Trouble concentrating on things, such as reading the newspaper or watching television  x    8. Moving or speaking so slowly that other people could have noticed.  For the opposite, being so fidgety or restless that you have been moving around a lot more than usual. x     9. Thoughts that you would be better off dead, or of hurting yourself x     Totals 0 1 2 12    10. If you have checked off any problems, how difficult have these problems needed for you to do your work, take care of things at home, or  get along with other people?    Not at all, somewhat difficult, very difficult, extremely difficult  TOTAL SCORE: 15 moderately severe depression  Pertinent Labs & Imaging:   A1C = 7.0  ASSESSMENT & PLAN:   Problem List Items Addressed This Visit      Cardiovascular and Mediastinum   Hypertension    Patient reports not taking her losartan at home as it does not make her feel well.  She is currently taking her chlorthalidone.  Her blood pressure in clinic today is 122/67.  She reports that her blood pressures are well controlled at home and she is otherwise compliant with her medications.  We will discontinue losartan as patient's blood  pressure appears well controlled.  In the setting of diabetes diagnosis, consider adding back ACE inhibitor or ARB for kidney protection.      Relevant Medications   chlorthalidone (HYGROTON) 25 MG tablet   atorvastatin (LIPITOR) 40 MG tablet     Respiratory   Allergic rhinitis   Relevant Medications   cetirizine (ZYRTEC) 10 MG tablet     Endocrine   Diabetes (HCC) (Chronic)    Patient's A1c is 7 today, increased from 6.4 in April.  Results after patient left building.  Call patient and inform her of her diagnosis.  Asked her to come in sooner to start counsel over the phone about different medications that she can be started on.  For discussed dietary modifications and lifestyle modifications.  Repeat A1c in January 2020.  Would like her to start taking statin as previously prescribed.      Relevant Medications   atorvastatin (LIPITOR) 40 MG tablet   Other Relevant Orders   HgB A1c (Completed)     Other   Hyperlipidemia    Lipid panel not addressed today.  Patient is not currently taking statin.    Next visit will get lipid panel.  Will call patient and ask her to begin statin, as it was sent to the pharmacy in the setting of her newly diagnosed diabetes      Relevant Medications   chlorthalidone (HYGROTON) 25 MG tablet    atorvastatin (LIPITOR) 40 MG tablet   Major depressive disorder, recurrent episode (HCC) (Chronic)    Patient's PHQ 9 today is 15.  Offered depression medication in combination with behavioral modification/therapy.  Patient remembers speaking to somebody in the past year and reports that it helped.  Referred patient to Dr. Gwenlyn Saran and provided her with card.  Encouraged her to call for an appointment  No prescription medications today.  Can consider fluoxetine in the future.  Follow up with patient in 1 month       Other Visit Diagnoses    Varicose veins of right lower extremity with pain    -  Primary   Relevant Medications   chlorthalidone (HYGROTON) 25 MG tablet   atorvastatin (LIPITOR) 40 MG tablet   Other Relevant Orders   Ambulatory referral to Vascular Surgery     Health Maintenance Due  Topic Date Due  . INFLUENZA VACCINE  09/15/2017     Zettie Cooley, M.D. [place]  PGY -1 11/10/2017, 1:49 PM

## 2017-11-28 ENCOUNTER — Other Ambulatory Visit: Payer: Self-pay

## 2017-11-28 DIAGNOSIS — I83811 Varicose veins of right lower extremities with pain: Secondary | ICD-10-CM

## 2017-12-06 ENCOUNTER — Telehealth: Payer: Self-pay | Admitting: *Deleted

## 2017-12-06 DIAGNOSIS — I10 Essential (primary) hypertension: Secondary | ICD-10-CM

## 2017-12-06 DIAGNOSIS — J309 Allergic rhinitis, unspecified: Secondary | ICD-10-CM

## 2017-12-06 MED ORDER — CHLORTHALIDONE 25 MG PO TABS: 25.0000 mg | ORAL_TABLET | Freq: Every day | ORAL | 3 refills | Status: DC

## 2017-12-06 MED ORDER — CETIRIZINE HCL 10 MG PO TABS: 10.0000 mg | ORAL_TABLET | Freq: Every day | ORAL | 3 refills | Status: DC

## 2017-12-06 MED ORDER — FLUTICASONE PROPIONATE 50 MCG/ACT NA SUSP: 2.0000 | Freq: Every day | NASAL | 6 refills | Status: DC

## 2017-12-06 NOTE — Telephone Encounter (Signed)
Pt states that [facility] never received the 3 month supply request on these medications.  She is asking that they be resent.  Resent as requested.

## 2018-01-05 DIAGNOSIS — G5603 Carpal tunnel syndrome, bilateral upper limbs: Secondary | ICD-10-CM | POA: Diagnosis not present

## 2018-01-05 DIAGNOSIS — I10 Essential (primary) hypertension: Secondary | ICD-10-CM | POA: Diagnosis not present

## 2018-01-10 ENCOUNTER — Ambulatory Visit: Payer: Medicare Other

## 2018-01-16 ENCOUNTER — Other Ambulatory Visit: Payer: Self-pay

## 2018-01-16 NOTE — Patient Outreach (Signed)
[name]) Care Management  01/16/2018  Susan Ingram [birth year] [id number]   Medication Adherence call to [name] patient did not answer patient is due on Losartan 50 mg. Susan Ingram is showing past due under [place].   [place] Management Direct Dial [phone]  Fax [phone] [email or web address]

## 2018-01-24 ENCOUNTER — Ambulatory Visit: Payer: Medicare Other | Admitting: Family Medicine

## 2018-01-24 ENCOUNTER — Encounter: Payer: Medicare Other | Admitting: Vascular Surgery

## 2018-01-24 ENCOUNTER — Encounter (HOSPITAL_COMMUNITY): Payer: Medicare Other

## 2018-02-28 ENCOUNTER — Other Ambulatory Visit: Payer: Self-pay

## 2018-02-28 ENCOUNTER — Encounter: Payer: Self-pay | Admitting: Family Medicine

## 2018-02-28 ENCOUNTER — Ambulatory Visit (INDEPENDENT_AMBULATORY_CARE_PROVIDER_SITE_OTHER): Payer: Medicare Other | Admitting: Family Medicine

## 2018-02-28 VITALS — BP 124/68 | HR 68 | Temp 98.4°F | Ht 66.0 in | Wt 176.6 lb

## 2018-02-28 DIAGNOSIS — E119 Type 2 diabetes mellitus without complications: Secondary | ICD-10-CM

## 2018-02-28 DIAGNOSIS — E1169 Type 2 diabetes mellitus with other specified complication: Secondary | ICD-10-CM

## 2018-02-28 DIAGNOSIS — K5900 Constipation, unspecified: Secondary | ICD-10-CM

## 2018-02-28 HISTORY — DX: Type 2 diabetes mellitus without complications: E11.9

## 2018-02-28 LAB — POCT GLYCOSYLATED HEMOGLOBIN (HGB A1C): HbA1c, POC (controlled diabetic range): 6.9 % (ref 0.0–7.0)

## 2018-02-28 MED ORDER — METFORMIN HCL 500 MG PO TABS: 500.0000 mg | ORAL_TABLET | Freq: Every day | ORAL | 2 refills | Status: DC

## 2018-02-28 NOTE — Assessment & Plan Note (Signed)
Patient continues to eat a lot of carbs during the day and would benefit greatly from meeting with Dr. Jenne Campus, [facility] dietitian.  Gave patient her card with directions to call for an appointment.  Patient is very agreeable to see Dr. Jenne Campus.  We will start patient on metformin 500 mg in the mornings  At next visit, check annual blood tests including CMP and lipid profile if none previously, and urine microalbumin.  Patient will follow-up in 3 months.  Patient previously on losartan and did not tolerate well.  Her blood pressure remains normal without.  Likely will not start patient on losartan again as she may not experience will benefit from medication considering her age.  We will follow-up in ophthalmology appointment results

## 2018-02-28 NOTE — Progress Notes (Addendum)
SUBJECTIVE:  PCP: Wilber Oliphant, MD Patient ID: MRN [id number]  Date of birth: Jan 07, 1941  HPI Susan Ingram is a 78 y.o. female who presents for follow up of chronic medical condition and new onset constipation  #New onset Diabetes  POCT A1C today is 6.9 from 7.0 three months ago. Patient reports that she has been "eating things she has no business eating".  She reports that she is having about 4 sweet potatoes, several grapes every day, peanut butter jelly, several slices of honey wheat bread daily.  She does not have any increased thirst but has been peeing more at night as she has reported drinking more water at the end the night.  She is not having any numbness in her feet.  She does report that she will be having carpal tunnel surgery on the 21st on her left hand, followed by her right hand later on.  She will be following up with her ophthalmologist, Dr. Katy Fitch, next month. She denies polydipsia, no chest pain, dyspnea or TIA's, no numbness, tingling or pain in extremities, no unusual visual symptoms.  #Constipation Patient reports that she has been feeling constipated recently.  She reports that she typically goes once or twice a day.  Her last bowel movement was yesterday.  She reports that her bowel movements have been hard and lumpy and difficult to pass.  She drinks about 36 ounces of water daily.She has tried using stool softener without any relief.    Review of Symptoms: See HPI  HISTORY Medications & Allergies: Reviewed with patient and updated in EMR as appropriate.   PMHx:  Patient Active Problem List   Diagnosis Date Noted  . Diabetes ([place]) 02/28/2018  . Constipation 02/28/2018  . Major depressive disorder, recurrent episode ([place]) 02/11/2016  . Allergic rhinitis 04/22/2015  . Hyperlipidemia 01/02/2014  . Hypertension 02/18/2012  . Asthma 02/18/2012   Past Surgical History:  Procedure Laterality Date  . Bronchoscopy, left video-assisted thoracoscopy, wedge   05/23/2008  . Left thyroid lobectomy, frozen section.  10/11/2002  . VAGINAL HYSTERECTOMY      FHx:  family history includes Alzheimer's disease in her father; Diabetes in her brother; Early death in her mother; Hypertension in her mother; Stroke in her mother.  SHx  reports that she quit smoking about 24 years ago. She has never used smokeless tobacco. She reports that she does not drink alcohol or use drugs.  OBJECTIVE:  BP 124/68   Pulse 68   Temp 98.4 F (36.9 C) (Oral)   Ht 5\' 6"  (1.676 m)   Wt 80.1 kg   SpO2 99%   BMI 28.50 kg/m   Physical Exam:  Gen: NAD, alert, non-toxic, well-appearing, sitting comfortably  Skin: Warm and dry. No obvious rashes, lesions, or trauma. HEENT: NCAT No conjunctival pallor or injection. PERRLA. No scleral icterus or injection.  MMM.  CV: RRR.  Normal S1-S2.   <2s capillary refill bilaterally.  RP 2+ bilaterally. No BLEE. Resp: CTAB.  No wheezing, rales, abnormal lung sounds.  No increased WOB Abd: NTND on palpation to all 4 quadrants.  Positive bowel sounds. Psych: Cooperative with exam. Pleasant. Makes eye contact. Speech normal. Extremities: Moves all extremities spontaneously  Neuro: CN II-XII grossly intact. No FNDs.  Back: spine symmetric w/o abnormal curvature. No TTP C/T/L spinous processes   Diabetic foot exam: Upon inspection there are no lesions, rash, macerated areas between toes, bony deformities, bunions. DPs are 2+ bilaterally.  There is no loss of sensation to  monofilament touch or pinprick sensation bilaterally at sites at high risk for ulceration. Patient has a normal gait and joint mobility.  Patient has preserved great toe proprioception bilaterally.  Pertinent Labs & Imaging:  Reviewed in chart   ASSESSMENT & PLAN:   Problem List Items Addressed This Visit      Endocrine   Diabetes ([place]) - Primary (Chronic)    Patient continues to eat a lot of carbs during the day and would benefit greatly from meeting with Dr.  Jenne Campus, [facility] dietitian.  Gave patient her card with directions to call for an appointment.  Patient is very agreeable to see Dr. Jenne Campus.  We will start patient on metformin 500 mg in the mornings  At next visit, check annual blood tests including CMP and lipid profile if none previously, and urine microalbumin.  Patient will follow-up in 3 months.  Patient previously on losartan and did not tolerate well.  Her blood pressure remains normal without.  Likely will not start patient on losartan again as she may not experience will benefit from medication considering her age.  We will follow-up in ophthalmology appointment results      Relevant Medications   metFORMIN (GLUCOPHAGE) 500 MG tablet   Other Relevant Orders   HgB A1c (Completed)     Other   Constipation     Encourage patient to drink more water 4 to 6 12 ounce bottles daily  We will not start on any other medications as patient is starting on metformin today  Continue to follow.        Zettie Cooley, M.D. [place]  PGY -1 02/28/2018, 3:17 PM

## 2018-02-28 NOTE — Patient Instructions (Addendum)
Dear Susan Ingram,   It was nice to see you today! I am glad you came in for your concerns. This document serves as a "wrap-up" to all that we discussed today and is listed as follows:   1. Type 2 diabetes   Your A1c was increased to 7.0 today.  Please start taking metformin 500 mg in the mornings. Please call and let me know if you experience any side effects from the medication.   Please come back in three months to recheck your Hemoglobin A1c.   Please Call Dr. Jenne Campus to make an appointment for diabetic diet education.   At you next appointment, we will check annual blood labs.   Your To Do List:  . Retinal exam at your eye doctor appointment in one month   Thank you for choosing [facility] for your primary care needs and stay well!   Best,   Dr. Zettie Cooley Resident Physician, PGY-1 [facility] [phone]    Don't forget to sign up for MyChart for instant access to your health profile, labs, orders, upcoming appointments or to contact your provider with questions. Stop at the front desk on the way out for more information about how to sign up!   Type 2 Diabetes Mellitus, Self Care, Adult When you have type 2 diabetes (type 2 diabetes mellitus), you must make sure your blood sugar (glucose) stays in a healthy range. You can do this with:  Nutrition.  Exercise.  Lifestyle changes.  Medicines or insulin, if needed.  Support from your doctors and others. How to stay aware of blood sugar   Check your blood sugar level every day, as often as told.  Have your A1c (hemoglobin A1c) level checked two or more times a year. Have it checked more often if your doctor tells you to. Your doctor will set personal treatment goals for you. Generally, you should have these blood sugar levels:  Before meals (preprandial): 80-130 mg/dL (4.4-7.2 mmol/L).  After meals (postprandial): below 180 mg/dL (10 mmol/L).  A1c level: less than 7%. How to  manage high and low blood sugar Signs of high blood sugar High blood sugar is called hyperglycemia. Know the signs of high blood sugar. Signs may include:  Feeling: ? Thirsty. ? Hungry. ? Very tired.  Needing to pee (urinate) more than usual.  Blurry vision. Signs of low blood sugar Low blood sugar is called hypoglycemia. This is when blood sugar is at or below 70 mg/dL (3.9 mmol/L). Signs may include:  Feeling: ? Hungry. ? Worried or nervous (anxious). ? Sweaty and clammy. ? Confused. ? Dizzy. ? Sleepy. ? Sick to your stomach (nauseous).  Having: ? A fast heartbeat. ? A headache. ? A change in your vision. ? Jerky movements that you cannot control (seizure). ? Tingling or no feeling (numbness) around your mouth, lips, or tongue.  Having trouble with: ? Moving (coordination). ? Sleeping. ? Passing out (fainting). ? Getting upset easily (irritability). Treating low blood sugar To treat low blood sugar, eat or drink something sugary right away. If you can think clearly and swallow safely, follow the 15:15 rule:  Take 15 grams of a fast-acting carb (carbohydrate). Talk with your doctor about how much you should take.  Some fast-acting carbs are: ? Sugar tablets (glucose pills). Take 3-4 pills. ? 6-8 pieces of hard candy. ? 4-6 oz (120-150 mL) of fruit juice. ? 4-6 oz (120-150 mL) of regular (not diet) soda. ? 1 Tbsp (15 mL)  honey or sugar.  Check your blood sugar 15 minutes after you take the carb.  If your blood sugar is still at or below 70 mg/dL (3.9 mmol/L), take 15 grams of a carb again.  If your blood sugar does not go above 70 mg/dL (3.9 mmol/L) after 3 tries, get help right away.  After your blood sugar goes back to normal, eat a meal or a snack within 1 hour. Treating very low blood sugar If your blood sugar is at or below 54 mg/dL (3 mmol/L), you have very low blood sugar (severe hypoglycemia). This is an emergency. Do not wait to see if the symptoms  will go away. Get medical help right away. Call your local emergency services (911 in the [place] If you have very low blood sugar and you cannot eat or drink, you may need a glucagon shot (injection). A family member or friend should learn how to check your blood sugar and how to give you a glucagon shot. Ask your doctor if you need to have a glucagon shot kit at home. Follow these instructions at home: Medicine  Take insulin and diabetes medicines as told.  If your doctor says you should take more or less insulin and medicines, do this exactly as told.  Do not run out of insulin or medicines. Having diabetes can raise your risk for other long-term conditions. These include heart disease and kidney disease. Your doctor may prescribe medicines to help you not have these problems. Food   Make healthy food choices. These include: ? Chicken, fish, egg whites, and beans. ? Oats, whole wheat, bulgur, brown rice, quinoa, and millet. ? Fresh fruits and vegetables. ? Low-fat dairy products. ? Nuts, avocado, olive oil, and canola oil.  Meet with a food specialist (dietitian). He or she can help you make an eating plan that is right for you.  Follow instructions from your doctor about what you cannot eat or drink.  Drink enough fluid to keep your pee (urine) pale yellow.  Keep track of carbs that you eat. Do this by reading food labels and learning food serving sizes.  Follow your sick day plan when you cannot eat or drink normally. Make this plan with your doctor so it is ready to use. Activity  Exercise 3 or more times a week.  Do not go more than 2 days without exercising.  Talk with your doctor before you start a new exercise. Your doctor may need to tell you to change: ? How much insulin or medicines you take. ? How much food you eat. Lifestyle  Do not use any tobacco products. These include cigarettes, chewing tobacco, and e-cigarettes. If you need help quitting, ask your  doctor.  Ask your doctor how much alcohol is safe for you.  Learn to deal with stress. If you need help with this, ask your doctor. Body care   Stay up to date with your shots (immunizations).  Have your eyes and feet checked by a doctor as often as told.  Check your skin and feet every day. Check for cuts, bruises, redness, blisters, or sores.  Brush your teeth and gums two times a day. Floss one or more times a day.  Go to the dentist one or more times every 6 months.  Stay at a healthy weight. General instructions  Take over-the-counter and prescription medicines only as told by your doctor.  Share your diabetes care plan with: ? Your work or school. ? People you live with.  Carry a card or wear jewelry that says you have diabetes.  Keep all follow-up visits as told by your doctor. This is important. Questions to ask your doctor  Do I need to meet with a diabetes educator?  Where can I find a support group for people with diabetes? Where to find more information To learn more about diabetes, visit:  American Diabetes Association: www.diabetes.org  American Association of Diabetes Educators: www.diabeteseducator.org Summary  When you have type 2 diabetes, you must make sure your blood sugar (glucose) stays in a healthy range.  Check your blood sugar every day, as often as told.  Having diabetes can raise your risk for other conditions. Your doctor may prescribe medicines to help you not have these problems.  Keep all follow-up visits as told by your doctor. This is important. This information is not intended to replace advice given to you by your health care provider. Make sure you discuss any questions you have with your health care provider. Document Released: 05/26/2015 Document Revised: 07/25/2017 Document Reviewed: 03/07/2015 Elsevier Interactive Patient Education  2019 [place].   Diabetes Mellitus and Exercise Exercising regularly is important for  your overall health, especially when you have diabetes (diabetes mellitus). Exercising is not only about losing weight. It has many other health benefits, such as increasing muscle strength and bone density and reducing body fat and stress. This leads to improved fitness, flexibility, and endurance, all of which result in better overall health. Exercise has additional benefits for people with diabetes, including:  Reducing appetite.  Helping to lower and control blood glucose.  Lowering blood pressure.  Helping to control amounts of fatty substances (lipids) in the blood, such as cholesterol and triglycerides.  Helping the body to respond better to insulin (improving insulin sensitivity).  Reducing how much insulin the body needs.  Decreasing the risk for heart disease by: ? Lowering cholesterol and triglyceride levels. ? Increasing the levels of good cholesterol. ? Lowering blood glucose levels. What is my activity plan? Your health care provider or certified diabetes educator can help you make a plan for the type and frequency of exercise (activity plan) that works for you. Make sure that you:  Do at least 150 minutes of moderate-intensity or vigorous-intensity exercise each week. This could be brisk walking, biking, or water aerobics. ? Do stretching and strength exercises, such as yoga or weightlifting, at least 2 times a week. ? Spread out your activity over at least 3 days of the week.  Get some form of physical activity every day. ? Do not go more than 2 days in a row without some kind of physical activity. ? Avoid being inactive for more than 30 minutes at a time. Take frequent breaks to walk or stretch.  Choose a type of exercise or activity that you enjoy, and set realistic goals.  Start slowly, and gradually increase the intensity of your exercise over time. What do I need to know about managing my diabetes?   Check your blood glucose before and after exercising. ? If  your blood glucose is 240 mg/dL (13.3 mmol/L) or higher before you exercise, check your urine for ketones. If you have ketones in your urine, do not exercise until your blood glucose returns to normal. ? If your blood glucose is 100 mg/dL (5.6 mmol/L) or lower, eat a snack containing 15-20 grams of carbohydrate. Check your blood glucose 15 minutes after the snack to make sure that your level is above 100 mg/dL (5.6 mmol/L) before  you start your exercise.  Know the symptoms of low blood glucose (hypoglycemia) and how to treat it. Your risk for hypoglycemia increases during and after exercise. Common symptoms of hypoglycemia can include: ? Hunger. ? Anxiety. ? Sweating and feeling clammy. ? Confusion. ? Dizziness or feeling light-headed. ? Increased heart rate or palpitations. ? Blurry vision. ? Tingling or numbness around the mouth, lips, or tongue. ? Tremors or shakes. ? Irritability.  Keep a rapid-acting carbohydrate snack available before, during, and after exercise to help prevent or treat hypoglycemia.  Avoid injecting insulin into areas of the body that are going to be exercised. For example, avoid injecting insulin into: ? The arms, when playing tennis. ? The legs, when jogging.  Keep records of your exercise habits. Doing this can help you and your health care provider adjust your diabetes management plan as needed. Write down: ? Food that you eat before and after you exercise. ? Blood glucose levels before and after you exercise. ? The type and amount of exercise you have done. ? When your insulin is expected to peak, if you use insulin. Avoid exercising at times when your insulin is peaking.  When you start a new exercise or activity, work with your health care provider to make sure the activity is safe for you, and to adjust your insulin, medicines, or food intake as needed.  Drink plenty of water while you exercise to prevent dehydration or heat stroke. Drink enough fluid to  keep your urine clear or pale yellow. Summary  Exercising regularly is important for your overall health, especially when you have diabetes (diabetes mellitus).  Exercising has many health benefits, such as increasing muscle strength and bone density and reducing body fat and stress.  Your health care provider or certified diabetes educator can help you make a plan for the type and frequency of exercise (activity plan) that works for you.  When you start a new exercise or activity, work with your health care provider to make sure the activity is safe for you, and to adjust your insulin, medicines, or food intake as needed. This information is not intended to replace advice given to you by your health care provider. Make sure you discuss any questions you have with your health care provider. Document Released: 04/24/2003 Document Revised: 08/12/2016 Document Reviewed: 07/14/2015 Elsevier Interactive Patient Education  2019 [place].  Hyperglycemia Hyperglycemia is when the sugar (glucose) level in your blood is too high. It may not cause symptoms. If you do have symptoms, they may include warning signs, such as:  Feeling more thirsty than normal.  Hunger.  Feeling tired.  Needing to pee (urinate) more than normal.  Blurry eyesight (vision). You may get other symptoms as it gets worse, such as:  Dry mouth.  Not being hungry (loss of appetite).  Fruity-smelling breath.  Weakness.  Weight gain or loss that is not planned. Weight loss may be fast.  A tingling or numb feeling in your hands or feet.  Headache.  Skin that does not bounce back quickly when it is lightly pinched and released (poor skin turgor).  Pain in your belly (abdomen).  Cuts or bruises that heal slowly. High blood sugar can happen to people who do or do not have diabetes. High blood sugar can happen slowly or quickly, and it can be an emergency. Follow these instructions at home: General  instructions  Take over-the-counter and prescription medicines only as told by your doctor.  Do not use products that  contain nicotine or tobacco, such as cigarettes and e-cigarettes. If you need help quitting, ask your doctor.  Limit alcohol intake to no more than 1 drink per day for nonpregnant women and 2 drinks per day for men. One drink equals 12 oz of beer, 5 oz of wine, or 1 oz of hard liquor.  Manage stress. If you need help with this, ask your doctor.  Keep all follow-up visits as told by your doctor. This is important. Eating and drinking   Stay at a healthy weight.  Exercise regularly, as told by your doctor.  Drink enough fluid, especially when you: ? Exercise. ? Get sick. ? Are in hot temperatures.  Eat healthy foods, such as: ? Low-fat (lean) proteins. ? Complex carbs (complex carbohydrates), such as whole wheat bread or brown rice. ? Fresh fruits and vegetables. ? Low-fat dairy products. ? Healthy fats.  Drink enough fluid to keep your pee (urine) clear or pale yellow. If you have diabetes:   Make sure you know the symptoms of hyperglycemia.  Follow your diabetes management plan, as told by your doctor. Make sure you: ? Take insulin and medicines as told. ? Follow your exercise plan. ? Follow your meal plan. Eat on time. Do not skip meals. ? Check your blood sugar as often as told. Make sure to check before and after exercise. If you exercise longer or in a different way than you normally do, check your blood sugar more often. ? Follow your sick day plan whenever you cannot eat or drink normally. Make this plan ahead of time with your doctor.  Share your diabetes management plan with people in your workplace, school, and household.  Check your urine for ketones when you are ill and as told by your doctor.  Carry a card or wear jewelry that says that you have diabetes. Contact a doctor if:  Your blood sugar level is higher than 240 mg/dL (13.3 mmol/L)  for 2 days in a row.  You have problems keeping your blood sugar in your target range.  High blood sugar happens often for you. Get help right away if:  You have trouble breathing.  You have a change in how you think, feel, or act (mental status).  You feel sick to your stomach (nauseous), and that feeling does not go away.  You cannot stop throwing up (vomiting). These symptoms may be an emergency. Do not wait to see if the symptoms will go away. Get medical help right away. Call your local emergency services (911 in the [place] Do not drive yourself to the hospital. Summary  Hyperglycemia is when the sugar (glucose) level in your blood is too high.  High blood sugar can happen to people who do or do not have diabetes.  Make sure you drink enough fluids, eat healthy foods, and exercise regularly.  Contact your doctor if you have problems keeping your blood sugar in your target range. This information is not intended to replace advice given to you by your health care provider. Make sure you discuss any questions you have with your health care provider. Document Released: 11/29/2008 Document Revised: 10/20/2015 Document Reviewed: 10/20/2015 Elsevier Interactive Patient Education  2019 [place]. Daily Diabetes Record Introduction Check your blood glucose (BG) as directed by your health care provider. Use this form to record your BG results as well as any diabetes medicines that you take, including insulin. Bringing a record of your BG results and a list of your current medicines to your  health care provider is very helpful in managing your diabetes. These numbers help your health care provider to know whether your diabetes management plan needs to be changed. Patient name: ____________________________________ Week of ____________________ Daily BG results and diabetes medicines Date: _________  Breakfast - BG / Medicines: ________________ /  __________________________________________________________  Lunch - BG / Medicines: ___________________ / __________________________________________________________  Dinner - BG / Medicines: __________________ / __________________________________________________________  Bedtime - BG / Medicines: ________________ / ___________________________________________________________ Date: _________  Breakfast - BG / Medicines: ________________ / __________________________________________________________  Lunch - BG / Medicines: ___________________ / __________________________________________________________  Dinner - BG / Medicines: __________________ / __________________________________________________________  Bedtime - BG / Medicines: ________________ / ___________________________________________________________ Date: _________  Breakfast - BG / Medicines: ________________ / __________________________________________________________  Lunch - BG / Medicines: ___________________ / __________________________________________________________  Dinner - BG / Medicines: __________________ / __________________________________________________________  Bedtime - BG / Medicines: ________________ / ___________________________________________________________ Date: _________  Breakfast - BG / Medicines: ________________ / __________________________________________________________  Lunch - BG / Medicines: ___________________ / __________________________________________________________  Dinner - BG / Medicines: __________________ / __________________________________________________________  Bedtime - BG / Medicines: ________________ / ___________________________________________________________ Date: _________  Breakfast - BG / Medicines: ________________ / __________________________________________________________  Lunch - BG / Medicines: ___________________ /  __________________________________________________________  Dinner - BG / Medicines: __________________ / __________________________________________________________  Bedtime - BG / Medicines: ________________ / ___________________________________________________________ Date: _________  Breakfast - BG / Medicines: ________________ / __________________________________________________________  Lunch - BG / Medicines: ___________________ / __________________________________________________________  Dinner - BG / Medicines: __________________ / __________________________________________________________  Bedtime - BG / Medicines: ________________ / ___________________________________________________________ Date: _________  Breakfast - BG / Medicines: ________________ / __________________________________________________________  Lunch - BG / Medicines: ___________________ / __________________________________________________________  Dinner - BG / Medicines: __________________ / __________________________________________________________  Bedtime - BG / Medicines: ________________ / ___________________________________________________________ Notes: ______________________________________________________________________________________________________________________ This information is not intended to replace advice given to you by your health care provider. Make sure you discuss any questions you have with your health care provider. Document Released: 01/06/2004 Document Revised: 10/31/2015 Document Reviewed: 10/31/2015 Elsevier Interactive Patient Education  2019 [employer].

## 2018-02-28 NOTE — Assessment & Plan Note (Signed)
   Encourage patient to drink more water 4 to 6 12 ounce bottles daily  We will not start on any other medications as patient is starting on metformin today  Continue to follow.

## 2018-03-06 ENCOUNTER — Telehealth: Payer: Self-pay | Admitting: Family Medicine

## 2018-03-06 NOTE — Telephone Encounter (Signed)
Pt called and she requested a sheet on what she can and cannot eat. Please call her back at [phone]

## 2018-03-07 ENCOUNTER — Other Ambulatory Visit: Payer: Self-pay | Admitting: Family Medicine

## 2018-03-07 DIAGNOSIS — G5603 Carpal tunnel syndrome, bilateral upper limbs: Secondary | ICD-10-CM | POA: Diagnosis not present

## 2018-03-07 DIAGNOSIS — E118 Type 2 diabetes mellitus with unspecified complications: Secondary | ICD-10-CM

## 2018-03-13 DIAGNOSIS — G5602 Carpal tunnel syndrome, left upper limb: Secondary | ICD-10-CM | POA: Diagnosis not present

## 2018-03-14 ENCOUNTER — Other Ambulatory Visit: Payer: Self-pay | Admitting: Family Medicine

## 2018-03-14 DIAGNOSIS — Z1231 Encounter for screening mammogram for malignant neoplasm of breast: Secondary | ICD-10-CM

## 2018-03-20 ENCOUNTER — Ambulatory Visit: Payer: Medicare Other | Admitting: Family Medicine

## 2018-03-30 DIAGNOSIS — Z961 Presence of intraocular lens: Secondary | ICD-10-CM | POA: Diagnosis not present

## 2018-03-30 DIAGNOSIS — H40013 Open angle with borderline findings, low risk, bilateral: Secondary | ICD-10-CM | POA: Diagnosis not present

## 2018-03-30 DIAGNOSIS — E119 Type 2 diabetes mellitus without complications: Secondary | ICD-10-CM | POA: Diagnosis not present

## 2018-03-30 LAB — HM DIABETES EYE EXAM

## 2018-04-26 ENCOUNTER — Other Ambulatory Visit: Payer: Self-pay

## 2018-04-26 ENCOUNTER — Ambulatory Visit (INDEPENDENT_AMBULATORY_CARE_PROVIDER_SITE_OTHER): Payer: Medicare Other | Admitting: Family Medicine

## 2018-04-26 ENCOUNTER — Encounter: Payer: Self-pay | Admitting: Family Medicine

## 2018-04-26 VITALS — BP 132/80 | HR 93 | Temp 98.7°F | Ht 66.0 in | Wt 168.8 lb

## 2018-04-26 DIAGNOSIS — F339 Major depressive disorder, recurrent, unspecified: Secondary | ICD-10-CM | POA: Diagnosis not present

## 2018-04-26 DIAGNOSIS — E119 Type 2 diabetes mellitus without complications: Secondary | ICD-10-CM | POA: Diagnosis not present

## 2018-04-26 LAB — POCT UA - MICROALBUMIN
Albumin/Creatinine Ratio, Urine, POC: <30
Creatinine, POC: 100 mg/dL
Microalbumin Ur, POC: 30 mg/L

## 2018-04-26 LAB — POCT GLYCOSYLATED HEMOGLOBIN (HGB A1C): HbA1c, POC (controlled diabetic range): 6.4 % (ref 0.0–7.0)

## 2018-04-26 NOTE — Progress Notes (Signed)
   Subjective:  PCP: Wilber Oliphant, MD Patient ID: [id number]  Date of birth: 04/09/1940  CC: Diabetes followup  HPI:  Diet is biggest concern today. Patient has not yet seen Dr.Sykes, but plans for make another appointment with her.  Medication compliance: compliant most of the time, diabetic diet compliance: compliant most of the time, home glucose monitoring: is not performed, further diabetic ROS: no polyuria or polydipsia, no chest pain, dyspnea or TIA's, no numbness, tingling or pain in extremities, no unusual visual symptoms, no hypoglycemia, weight has decreased, last eye exam approximately 3 weeks ago.  New concerns: Metformin side effects: making her heart flutter, "taking her hair out", and losing weight.  Diabetic foot exam done by medicare home visit. No concerns noted. Patient seen at ophthalmology (Dr. Katy Fitch) a couple of weeks ago.   Review of Symptoms: See HPI  Medications & Allergies: Reviewed with patient and updated in EMR as appropriate.   Social History: Renea reports that she quit smoking about 25 years ago. She has never used smokeless tobacco. She reports that she does not drink alcohol or use drugs. Objective:  Physical Exam:  BP 132/80   Pulse 93   Temp 98.7 F (37.1 C) (Oral)   Ht 5\' 6"  (1.676 m)   Wt 168 lb 12.8 oz (76.6 kg)   SpO2 96%   BMI 27.25 kg/m   General: NAD, non-toxic, well-appearing, sitting comfortably in bed.  Cardiovascular: RRR, normal S1, S2. 2+ RP & DP bilaterally Respiratory: CTAB. No IWOB.  Abdomen: + BS. NT, ND, soft to palpation.  Extremities: Warm and well perfused. Moving spontaneously.   Pertinent Labs & Imaging:  Reviewed in chart   Assessment & Plan:  1. Type 2 diabetes mellitus without complication, without long-term current use of insulin (HCC) Assessment: Diabetes Mellitus: stable.  Plan: See orders for this visit as documented in the electronic medical record. Diabetic issues reviewed with her: referral to  Diabetic Education department, diabetic diet discussed in detail, written exchange diet given, all medications, side effects and compliance discussed carefully, annual eye examinations at Ophthalmology discussed and long term diabetic complications discussed. A1C improved to 6.4. Patient does not want to take metformin anymore due to side effects. Obtained TSH for subjective sxs of hair falling out, heart beating fast while taking medications.  Will continue to follow Hgb A1C q 3 months as patient desires.   A1C in three months - Lipid Panel - Comprehensive metabolic panel - TSH - HgB A1c - POCT UA - Microalbumin  2. Episode of recurrent major depressive disorder, unspecified depression episode severity ([place]) - patient o follow up in two weeks   Zettie Cooley, M.D. [place]  PGY -1 04/28/2018, 2:12 PM

## 2018-04-26 NOTE — Patient Instructions (Addendum)
Dear Susan Ingram,   It was very nice to see you! Thank you for taking your time to come in to be seen. Today, we discussed the following:   Diabetes   You can stop taking your metformin if you're not feeling well while taking it.  We will follow up on your labs and decide where to go from there   I will see you two weeks to talk more about depression and anxiety. We can discuss your lab results then as well.   Be well,   Dr. Zettie Cooley [facility] [phone]   Sign up for MyChart for instant access to your health profile, labs, orders, upcoming appointments or to contact your provider with questions.    Diabetes Mellitus and Nutrition, Adult When you have diabetes (diabetes mellitus), it is very important to have healthy eating habits because your blood sugar (glucose) levels are greatly affected by what you eat and drink. Eating healthy foods in the appropriate amounts, at about the same times every day, can help you:  Control your blood glucose.  Lower your risk of heart disease.  Improve your blood pressure.  Reach or maintain a healthy weight. Every person with diabetes is different, and each person has different needs for a meal plan. Your health care provider may recommend that you work with a diet and nutrition specialist (dietitian) to make a meal plan that is best for you. Your meal plan may vary depending on factors such as:  The calories you need.  The medicines you take.  Your weight.  Your blood glucose, blood pressure, and cholesterol levels.  Your activity level.  Other health conditions you have, such as heart or kidney disease. How do carbohydrates affect me? Carbohydrates, also called carbs, affect your blood glucose level more than any other type of food. Eating carbs naturally raises the amount of glucose in your blood. Carb counting is a method for keeping track of how many carbs you eat. Counting carbs is important to keep  your blood glucose at a healthy level, especially if you use insulin or take certain oral diabetes medicines. It is important to know how many carbs you can safely have in each meal. This is different for every person. Your dietitian can help you calculate how many carbs you should have at each meal and for each snack. Foods that contain carbs include:  Bread, cereal, rice, pasta, and crackers.  Potatoes and corn.  Peas, beans, and lentils.  Milk and yogurt.  Fruit and juice.  Desserts, such as cakes, cookies, ice cream, and candy. How does alcohol affect me? Alcohol can cause a sudden decrease in blood glucose (hypoglycemia), especially if you use insulin or take certain oral diabetes medicines. Hypoglycemia can be a life-threatening condition. Symptoms of hypoglycemia (sleepiness, dizziness, and confusion) are similar to symptoms of having too much alcohol. If your health care provider says that alcohol is safe for you, follow these guidelines:  Limit alcohol intake to no more than 1 drink per day for nonpregnant women and 2 drinks per day for men. One drink equals 12 oz of beer, 5 oz of wine, or 1 oz of hard liquor.  Do not drink on an empty stomach.  Keep yourself hydrated with water, diet soda, or unsweetened iced tea.  Keep in mind that regular soda, juice, and other mixers may contain a lot of sugar and must be counted as carbs. What are tips for following this plan?  Reading food  labels  Start by checking the serving size on the "Nutrition Facts" label of packaged foods and drinks. The amount of calories, carbs, fats, and other nutrients listed on the label is based on one serving of the item. Many items contain more than one serving per package.  Check the total grams (g) of carbs in one serving. You can calculate the number of servings of carbs in one serving by dividing the total carbs by 15. For example, if a food has 30 g of total carbs, it would be equal to 2 servings  of carbs.  Check the number of grams (g) of saturated and trans fats in one serving. Choose foods that have low or no amount of these fats.  Check the number of milligrams (mg) of salt (sodium) in one serving. Most people should limit total sodium intake to less than 2,300 mg per day.  Always check the nutrition information of foods labeled as "low-fat" or "nonfat". These foods may be higher in added sugar or refined carbs and should be avoided.  Talk to your dietitian to identify your daily goals for nutrients listed on the label. Shopping  Avoid buying canned, premade, or processed foods. These foods tend to be high in fat, sodium, and added sugar.  Shop around the outside edge of the grocery store. This includes fresh fruits and vegetables, bulk grains, fresh meats, and fresh dairy. Cooking  Use low-heat cooking methods, such as baking, instead of high-heat cooking methods like deep frying.  Cook using healthy oils, such as olive, canola, or sunflower oil.  Avoid cooking with butter, cream, or high-fat meats. Meal planning  Eat meals and snacks regularly, preferably at the same times every day. Avoid going long periods of time without eating.  Eat foods high in fiber, such as fresh fruits, vegetables, beans, and whole grains. Talk to your dietitian about how many servings of carbs you can eat at each meal.  Eat 4-6 ounces (oz) of lean protein each day, such as lean meat, chicken, fish, eggs, or tofu. One oz of lean protein is equal to: ? 1 oz of meat, chicken, or fish. ? 1 egg. ?  cup of tofu.  Eat some foods each day that contain healthy fats, such as avocado, nuts, seeds, and fish. Lifestyle  Check your blood glucose regularly.  Exercise regularly as told by your health care provider. This may include: ? 150 minutes of moderate-intensity or vigorous-intensity exercise each week. This could be brisk walking, biking, or water aerobics. ? Stretching and doing strength  exercises, such as yoga or weightlifting, at least 2 times a week.  Take medicines as told by your health care provider.  Do not use any products that contain nicotine or tobacco, such as cigarettes and e-cigarettes. If you need help quitting, ask your health care provider.  Work with a Social worker or diabetes educator to identify strategies to manage stress and any emotional and social challenges. Questions to ask a health care provider  Do I need to meet with a diabetes educator?  Do I need to meet with a dietitian?  What number can I call if I have questions?  When are the best times to check my blood glucose? Where to find more information:  American Diabetes Association: diabetes.org  Academy of Nutrition and Dietetics: www.eatright.[employer] of Diabetes and Digestive and Kidney Diseases (NIH): [employer] Summary  A healthy meal plan will help you control your blood glucose and maintain a healthy lifestyle.  Working with a diet and nutrition specialist (dietitian) can help you make a meal plan that is best for you.  Keep in mind that carbohydrates (carbs) and alcohol have immediate effects on your blood glucose levels. It is important to count carbs and to use alcohol carefully. This information is not intended to replace advice given to you by your health care provider. Make sure you discuss any questions you have with your health care provider. Document Released: 10/29/2004 Document Revised: 09/01/2016 Document Reviewed: 03/08/2016 Elsevier Interactive Patient Education  2019 [place] for Eating Away From Home If You Have Diabetes Controlling your blood sugar (glucose) levels can be challenging when you do not prepare your own meals. The following tips can help you manage your diabetes when you eat away from home. If you have questions or if you need help, work with your health care provider or diet and nutrition specialist  (dietitian). Planning ahead Plan ahead if you know you will be eating away from home:  Try to eat your meals and snacks at about the same time each day. If you know your meal is going to be later than normal, make sure you have a small snack. Being very hungry can cause you to make unhealthy food choices.  Make a list of restaurants near you that offer healthy choices. If a restaurant has a carry-out menu, take the menu home and plan what you will order ahead of time.  Look up the restaurant you want to eat at online. Many chain and fast-food restaurants list nutritional information online. Use this information to choose the healthiest options and to calculate how many carbohydrates will be in your meal.  Use a carbohydrate-counting book or mobile app to look up the carbohydrate content and serving size of the foods you want to eat. Free foods A "free food" is any food or drink that has less than 5 grams of carbohydrates and less than 20 calories per serving. These food are high in fiber and nutrients and low in calories, carbohydrates, and fats. Free foods include:  Non-starchy vegetables, such as carrots, broccoli, celery, lettuce, or green beans.  Non-sugar drinks, such as water, unsweetened coffee, or unsweetened tea.  Low-calorie salad dressings.  Sugar-free gelatin. Starting meals with a salad full of vegetables is a healthy choice that includes a lot of free foods. Avoid high-calorie salad toppings like bacon, cheese, and high-fat dressings. Ask for your salad dressing to be served on the side so that you dip your fork in the dressing and then in the salad. This allows you to control how much dressing you eat and still get the flavor with every bite. Choices to control carbohydrates   Ask your server to take away the bread basket or chips from your table.  Choose light yogurt or [place] yogurt instead of non-fat sweetened yogurt.  Order fresh fruit. A salad bar often offers fresh  fruit choices. Avoid canned fruit because it is usually packed in sugar or syrup.  Order a salad, and ask for dressing on the side.  Ask for substitutes. For example, if your meal comes with french fries, ask for a side salad or steamed veggies instead. If a meal comes with fried chicken, ask for grilled chicken instead. Beverages  Choose drinks that are low in calories and sugar, such as: ? Water. ? Unsweetened tea or coffee. ? Lowfat milk.  Avoid the following drinks: ? Alcoholic beverages. ? Regular (not diet) sodas. Other tips  If  you take insulin, wait to take your insulin once your food arrives to your table. This will ensure that your insulin and your food are timed correctly.  Become familiar with serving sizes and learn to recognize how many servings are in a portion. Restaurant portions are typically two to three times larger than what you really need.  Ask your server for a to-go box at the beginning of the meal. When your food comes, leave the amount you should have on your plate, and put the rest in the to-go box so that you are not tempted to eat too much.  Consider splitting an entree with someone and ordering a side salad.  Avoid buffets. They are typically too tempting and result in overeating. Where to find more information  American Diabetes Association: www.diabetes.org  American Association of Diabetes Educators: www.diabeteseducator.org Summary  Plan ahead when eating away from home.  Try to eat your meals and snacks at about the same time each day. If you know your meal is going to be later than normal, make sure you have a small snack. Being very hungry can cause you to make unhealthy food choices.  Ask for substitutes. For example, if your meal comes with french fries, ask for a side salad or steamed veggies instead. If a meal comes with fried chicken, ask for grilled chicken instead.  Ask for a to-go box when you order your meal. Divide your meal  before you start eating. This information is not intended to replace advice given to you by your health care provider. Make sure you discuss any questions you have with your health care provider. Document Released: 02/01/2005 Document Revised: 05/12/2016 Document Reviewed: 05/12/2016 Elsevier Interactive Patient Education  2019 Elsevier Inc.  Prediabetes Eating Plan Prediabetes is a condition that causes blood sugar (glucose) levels to be higher than normal. This increases the risk for developing diabetes. In order to prevent diabetes from developing, your health care provider may recommend a diet and other lifestyle changes to help you:  Control your blood glucose levels.  Improve your cholesterol levels.  Manage your blood pressure. Your health care provider may recommend working with a diet and nutrition specialist (dietitian) to make a meal plan that is best for you. What are tips for following this plan? Lifestyle  Set weight loss goals with the help of your health care team. It is recommended that most people with prediabetes lose 7% of their current body weight.  Exercise for at least 30 minutes at least 5 days a week.  Attend a support group or seek ongoing support from a mental health counselor.  Take over-the-counter and prescription medicines only as told by your health care provider. Reading food labels  Read food labels to check the amount of fat, salt (sodium), and sugar in prepackaged foods. Avoid foods that have: ? Saturated fats. ? Trans fats. ? Added sugars.  Avoid foods that have more than 300 milligrams (mg) of sodium per serving. Limit your daily sodium intake to less than 2,300 mg each day. Shopping  Avoid buying pre-made and processed foods. Cooking  Cook with olive oil. Do not use butter, lard, or ghee.  Bake, broil, grill, or boil foods. Avoid frying. Meal planning   Work with your dietitian to develop an eating plan that is right for you. This may  include: ? Tracking how many calories you take in. Use a food diary, notebook, or mobile application to track what you eat at each meal. ? Using the  glycemic index (GI) to plan your meals. The index tells you how quickly a food will raise your blood glucose. Choose low-GI foods. These foods take a longer time to raise blood glucose.  Consider following a Mediterranean diet. This diet includes: ? Several servings each day of fresh fruits and vegetables. ? Eating fish at least twice a week. ? Several servings each day of whole grains, beans, nuts, and seeds. ? Using olive oil instead of other fats. ? Moderate alcohol consumption. ? Eating small amounts of red meat and whole-fat dairy.  If you have high blood pressure, you may need to limit your sodium intake or follow a diet such as the DASH eating plan. DASH is an eating plan that aims to lower high blood pressure. What foods are recommended? The items listed below may not be a complete list. Talk with your dietitian about what dietary choices are best for you. Grains Whole grains, such as whole-wheat or whole-grain breads, crackers, cereals, and pasta. Unsweetened oatmeal. Bulgur. Barley. Quinoa. Brown rice. Corn or whole-wheat flour tortillas or taco shells. Vegetables Lettuce. Spinach. Peas. Beets. Cauliflower. Cabbage. Broccoli. Carrots. Tomatoes. Squash. Eggplant. Herbs. Peppers. Onions. Cucumbers. Brussels sprouts. Fruits Berries. Bananas. Apples. Oranges. Grapes. Papaya. Mango. Pomegranate. Kiwi. Grapefruit. Cherries. Meats and other protein foods Seafood. Poultry without skin. Lean cuts of pork and beef. Tofu. Eggs. Nuts. Beans. Dairy Low-fat or fat-free dairy products, such as yogurt, cottage cheese, and cheese. Beverages Water. Tea. Coffee. Sugar-free or diet soda. Seltzer water. Lowfat or no-fat milk. Milk alternatives, such as soy or almond milk. Fats and oils Olive oil. Canola oil. Sunflower oil. Grapeseed oil. Avocado.  Walnuts. Sweets and desserts Sugar-free or low-fat pudding. Sugar-free or low-fat ice cream and other frozen treats. Seasoning and other foods Herbs. Sodium-free spices. Mustard. Relish. Low-fat, low-sugar ketchup. Low-fat, low-sugar barbecue sauce. Low-fat or fat-free mayonnaise. What foods are not recommended? The items listed below may not be a complete list. Talk with your dietitian about what dietary choices are best for you. Grains Refined white flour and flour products, such as bread, pasta, snack foods, and cereals. Vegetables Canned vegetables. Frozen vegetables with butter or cream sauce. Fruits Fruits canned with syrup. Meats and other protein foods Fatty cuts of meat. Poultry with skin. Breaded or fried meat. Processed meats. Dairy Full-fat yogurt, cheese, or milk. Beverages Sweetened drinks, such as sweet iced tea and soda. Fats and oils Butter. Lard. Ghee. Sweets and desserts Baked goods, such as cake, cupcakes, pastries, cookies, and cheesecake. Seasoning and other foods Spice mixes with added salt. Ketchup. Barbecue sauce. Mayonnaise. Summary  To prevent diabetes from developing, you may need to make diet and other lifestyle changes to help control blood sugar, improve cholesterol levels, and manage your blood pressure.  Set weight loss goals with the help of your health care team. It is recommended that most people with prediabetes lose 7 percent of their current body weight.  Consider following a Mediterranean diet that includes plenty of fresh fruits and vegetables, whole grains, beans, nuts, seeds, fish, lean meat, low-fat dairy, and healthy oils. This information is not intended to replace advice given to you by your health care provider. Make sure you discuss any questions you have with your health care provider. Document Released: 06/18/2014 Document Revised: 04/07/2016 Document Reviewed: 04/07/2016 Elsevier Interactive Patient Education  2019 [employer].

## 2018-04-27 ENCOUNTER — Encounter: Payer: Self-pay | Admitting: Family Medicine

## 2018-04-27 LAB — COMPREHENSIVE METABOLIC PANEL
ALT: 17 IU/L (ref 0–32)
AST: 21 IU/L (ref 0–40)
Albumin/Globulin Ratio: 1.9 (ref 1.2–2.2)
Albumin: 4.7 g/dL (ref 3.7–4.7)
Alkaline Phosphatase: 126 IU/L — ABNORMAL HIGH (ref 39–117)
BUN/Creatinine Ratio: 16 (ref 12–28)
BUN: 12 mg/dL (ref 8–27)
Bilirubin Total: 0.7 mg/dL (ref 0.0–1.2)
CO2: 24 mmol/L (ref 20–29)
Calcium: 9.9 mg/dL (ref 8.7–10.3)
Chloride: 97 mmol/L (ref 96–106)
Creatinine, Ser: 0.77 mg/dL (ref 0.57–1.00)
GFR calc Af Amer: 86 mL/min/{1.73_m2} (ref >59)
GFR calc non Af Amer: 74 mL/min/{1.73_m2} (ref >59)
Globulin, Total: 2.5 g/dL (ref 1.5–4.5)
Glucose: 100 mg/dL — ABNORMAL HIGH (ref 65–99)
Potassium: 3.3 mmol/L — ABNORMAL LOW (ref 3.5–5.2)
Sodium: 139 mmol/L (ref 134–144)
Total Protein: 7.2 g/dL (ref 6.0–8.5)

## 2018-04-27 LAB — LIPID PANEL
Chol/HDL Ratio: 2 ratio (ref 0.0–4.4)
Cholesterol, Total: 98 mg/dL — ABNORMAL LOW (ref 100–199)
HDL: 48 mg/dL (ref >39)
LDL Calculated: 36 mg/dL (ref 0–99)
Triglycerides: 68 mg/dL (ref 0–149)
VLDL Cholesterol Cal: 14 mg/dL (ref 5–40)

## 2018-04-27 LAB — TSH: TSH: 0.938 u[IU]/mL (ref 0.450–4.500)

## 2018-04-28 ENCOUNTER — Encounter: Payer: Self-pay | Admitting: Family Medicine

## 2018-04-28 NOTE — Assessment & Plan Note (Signed)
Patient to follow up next week to address

## 2018-04-28 NOTE — Assessment & Plan Note (Addendum)
A1C improved to 6.4. Patient does not want to take metformin anymore due to side effects. Obtained TSH for subjective sxs of hair falling out, heart beating fast while taking medications.  Will continue to follow Hgb A1C q 3 months as patient desires.   A1C in three months  Assessment: Diabetes Mellitus: stable.  Plan: See orders for this visit as documented in the electronic medical record. Diabetic issues reviewed with her: referral to Diabetic Education department, diabetic diet discussed in detail, written exchange diet given, all medications, side effects and compliance discussed carefully, annual eye examinations at Ophthalmology discussed and long term diabetic complications discussed.

## 2018-05-02 ENCOUNTER — Telehealth: Payer: Self-pay | Admitting: *Deleted

## 2018-05-02 NOTE — Telephone Encounter (Signed)
Pt will need to fill out a release of information to retrieve her eye exam, placed a note in her appointment note to be sure to have her fill out the form. April Zimmerman Rumple, CMA

## 2018-05-02 NOTE — Telephone Encounter (Signed)
-----   Message from Wilber Oliphant, MD sent at 04/28/2018  2:14 PM EDT ----- Regarding: Opthalmology Please try to obtain patient's records with opth (Dr. Katy Fitch) to update health maintenance.   Thank you!!

## 2018-05-08 ENCOUNTER — Telehealth: Payer: Self-pay

## 2018-05-08 NOTE — Telephone Encounter (Signed)
Pt called nurse line stating she is very constipated and uncomfortable. Pt stated its been a good two weeks since ive had a "good" BM. Pt stated she used miralax on Saturday, with minimal relief. Pt thinks it didn't work bc she didn't have that much. Pt would like something called into her pharmacy, something stronger. Pt is really worried and doesn't want to end up at the hospital bc of the virus.

## 2018-05-09 ENCOUNTER — Telehealth (HOSPITAL_COMMUNITY): Payer: Self-pay | Admitting: Family Medicine

## 2018-05-09 ENCOUNTER — Encounter: Payer: Self-pay | Admitting: Family Medicine

## 2018-05-09 NOTE — Telephone Encounter (Signed)
Encounter made in error. 

## 2018-05-09 NOTE — Telephone Encounter (Signed)
I was preceptor the day of this visit.   

## 2018-05-09 NOTE — Telephone Encounter (Signed)
This encounter was created in error - please disregard.

## 2018-05-09 NOTE — Telephone Encounter (Signed)
Telephone Encounter  PCP: Wilber Oliphant, MD Historian/Reporter: Self 05/09/2018, 9:06 AM   BAYLEI SIEBELS is a 78 y.o. female who called the office about constipation. I called patient back this morning. Patient reports feeling constipated for two weeks. She reports having small bowel movements, one this morning, but are "small and hard". Patient has been taking 1 packet of mirilax daily for two days. Patient denies fever, bloating, severe abdominal pain. She does endorse some discomfort on her left side.   Patient to take mirilax twice daily, and start daily colace tomorrow if no relief. Patient can pull back on medications once she has consistent bowel movements. Patient also agrees to drink at least 4 bottles of water daily. Patient has good kidney function. Return call precautions provided. Patient told that if she has fever, moderate to severe abdominal pain to call back or to go to the ED to seek further care.   Zettie Cooley, M.D. [facility]  PGY -1 05/09/2018, 9:06 AM

## 2018-05-10 ENCOUNTER — Telehealth: Payer: Self-pay | Admitting: Family Medicine

## 2018-05-10 DIAGNOSIS — I10 Essential (primary) hypertension: Secondary | ICD-10-CM

## 2018-05-10 DIAGNOSIS — I152 Hypertension secondary to endocrine disorders: Secondary | ICD-10-CM | POA: Insufficient documentation

## 2018-05-10 DIAGNOSIS — E1159 Type 2 diabetes mellitus with other circulatory complications: Secondary | ICD-10-CM | POA: Insufficient documentation

## 2018-05-10 NOTE — Telephone Encounter (Signed)
Spoke to patient over the phone and agrees to do virtual visit tomorrow at scheduled appointment time.   Zettie Cooley, M.D.  Family Medicine  PGY-1 05/10/2018 3:35 PM

## 2018-05-10 NOTE — Telephone Encounter (Signed)
Called patient to assess chief complaint and offer telemed visit in lieu of being seen in clinic due in order to decrease exposure to coronavirus. No answer.

## 2018-05-11 ENCOUNTER — Ambulatory Visit (INDEPENDENT_AMBULATORY_CARE_PROVIDER_SITE_OTHER): Payer: Medicare Other | Admitting: Family Medicine

## 2018-05-11 ENCOUNTER — Other Ambulatory Visit: Payer: Self-pay

## 2018-05-11 ENCOUNTER — Ambulatory Visit: Payer: Medicare Other | Admitting: Family Medicine

## 2018-05-11 ENCOUNTER — Encounter: Payer: Self-pay | Admitting: Family Medicine

## 2018-05-11 DIAGNOSIS — F411 Generalized anxiety disorder: Secondary | ICD-10-CM

## 2018-05-11 DIAGNOSIS — F32A Depression, unspecified: Secondary | ICD-10-CM

## 2018-05-11 DIAGNOSIS — G479 Sleep disorder, unspecified: Secondary | ICD-10-CM | POA: Diagnosis not present

## 2018-05-11 DIAGNOSIS — F320 Major depressive disorder, single episode, mild: Secondary | ICD-10-CM

## 2018-05-11 NOTE — Progress Notes (Signed)
Telephone Encounter  PCP: Wilber Oliphant, MD  Participants : Wilber Oliphant, MD and VALARI TAYLOR 05/11/2018, 11:51 AM   Subjective:  CC: Followup for depression and anxiety    HPI:  Susan Ingram is a 78 y.o. female with past medical history significant for depression and anxiety who reported increasing depression and worry.   #Depression  Gets 4 hours of sleep. Run in family >1-2 years   PHQ-9 In the past 2 weeks, how often have you felt 0  not at all 1 (Some days) Less than 1/2 a week 2 More than 1/2 the week 3 Everyday  1. Little interest or pleasure in doing things   x    2. Feeling down, depressed, or hopeless   x    3. Trouble falling or staying asleep, or sleeping too much     x  4. Feeling tired or having little energy      x   5. Poor appetite or overeating    x    6. Feeling bad about yourself-or that you are a failure or have let yourself or your family down  x    7. Trouble concentrating on things, such as reading the newspaper or watching television  x    8. Moving or speaking so slowly that other people could have noticed.  For the opposite, being so fidgety or restless that you have been moving around a lot more than usual. x     9. Thoughts that you would be better off dead, or of hurting yourself x     Totals  5 2 3    10. If you have checked off any problems, how difficult have these problems needed for you to do your work, take care of things at home, or get along with other people?   Somewhat   #Anxiety  GAD-7 Anxiety  Over the last two weeks, how often have you been bothered by the following problems?  Not at all 0 Several days 1 More than half the days 2 Nearly Everyday 3  1. Feeling nervous, anxious or on edge   x    2. Not being able to sleep or control worrying   x    3. Worrying too much about different things  x    4. Trouble relaxing  x    5. Being so restless that it is hard to sit still  x    6. Becoming easily annoyed or  irritable  x    7. Feeling afraid as if something awful might happen  x    TOTALS       If getting too worried, try to shake it off and get up and do something. Patient prays. She worries about bills, and cannot work currently due to covid. But still thankful for the money she does get.   Constipation Having soft and formed   Medications & Allergies:  Reviewed in chart Pertinent Medical, Family and Social History: Reviewed and taken into consideration in the context of this patient's complaint.   Objective:  Pertinent Labs & Imaging:  Reviewed in chart   Assessment & Plan:  Difficulty sleeping Sleeps 4 hours per night. Goes to sleep at different times, just whenever she is tired. More issue falling asleep. Sleeps with the TV on. Has so daytime tiredness but does not nap   Encourage better sleep hygiene. No screens 1-2 hours prior to bed.   Can try melatonin, start at 1 mg and  can titrate to 5 mg.   Generalized anxiety disorder Anxiety is very mild to moderate. Does not interfere with daily life. Patient has very good coping skills and a lot of support from family and friends   Mild depression ([place]) Depression may be situational. Experienced depression previously when grandson was getting into trouble. Patient feels offs now due to not being able to work and having to stay home all day with COVID. Patient has very good coping skills and a lot of support from family and friends. Does not interfere with daily life. Medication not necessary at this time.  Return precautions provided  Daily gratitude    Telephone encounter lasted 25 minutes  Zettie Cooley, M.D. [place]  PGY -1 05/11/2018, 11:51 AM

## 2018-05-11 NOTE — Assessment & Plan Note (Signed)
>>  ASSESSMENT AND PLAN FOR MILD DEPRESSION WRITTEN ON 05/11/2018  5:55 PM BY Genia Hotter E, MD  Depression may be situational. Experienced depression previously when grandson was getting into trouble. Patient feels offs now due to not being able to work and having to stay home all day with COVID. Patient has very good coping skills and a lot of support from family and friends. Does not interfere with daily life. Medication not necessary at this time.  Return precautions provided  Daily gratitude

## 2018-05-11 NOTE — Assessment & Plan Note (Signed)
Sleeps 4 hours per night. Goes to sleep at different times, just whenever she is tired. More issue falling asleep. Sleeps with the TV on. Has so daytime tiredness but does not nap   Encourage better sleep hygiene. No screens 1-2 hours prior to bed.   Can try melatonin, start at 1 mg and can titrate to 5 mg.

## 2018-05-11 NOTE — Assessment & Plan Note (Signed)
Depression may be situational. Experienced depression previously when grandson was getting into trouble. Patient feels offs now due to not being able to work and having to stay home all day with COVID. Patient has very good coping skills and a lot of support from family and friends. Does not interfere with daily life. Medication not necessary at this time.  Return precautions provided  Daily gratitude

## 2018-05-11 NOTE — Assessment & Plan Note (Signed)
Anxiety is very mild to moderate. Does not interfere with daily life. Patient has very good coping skills and a lot of support from family and friends

## 2018-05-18 ENCOUNTER — Other Ambulatory Visit: Payer: Self-pay | Admitting: Family Medicine

## 2018-05-23 ENCOUNTER — Ambulatory Visit: Payer: Medicare Other

## 2018-05-23 ENCOUNTER — Other Ambulatory Visit: Payer: Self-pay | Admitting: *Deleted

## 2018-05-23 MED ORDER — METFORMIN HCL 500 MG PO TABS: 500.0000 mg | ORAL_TABLET | Freq: Every day | ORAL | 3 refills | Status: DC

## 2018-05-23 NOTE — Telephone Encounter (Signed)
Patient has started back taking metformin x 2 weeks.  She thought that she told Dr. Maudie Mercury.  She states that she is no longer having issues with metformin and "thinks that I should take it because of my diabetes".  Will forward to MD. Christen Bame, CMA

## 2018-06-20 ENCOUNTER — Telehealth: Payer: Self-pay | Admitting: *Deleted

## 2018-06-20 NOTE — Telephone Encounter (Signed)
Pt calls because she received a letter from [facility] that she is due for a pap smear but she was told that she did not need that due to her age.  Pt is very anxious about her weight loss and cancer.  She has a hx of cancer and is afraid to not get a pap and also that her wt loss and constipation is due to cancer.  Pt denies bloody or dark stools.  Pt has appt on Thursday and would like Dr. Maudie Mercury to discuss these concerns with her. Christen Bame, CMA

## 2018-06-22 ENCOUNTER — Encounter: Payer: Self-pay | Admitting: Family Medicine

## 2018-06-22 ENCOUNTER — Other Ambulatory Visit (HOSPITAL_COMMUNITY)
Admission: RE | Admit: 2018-06-22 | Discharge: 2018-06-22 | Disposition: A | Discharge status: Home / Self Care | Payer: Medicare Other | Source: Ambulatory Visit | Attending: Family Medicine | Admitting: Family Medicine

## 2018-06-22 ENCOUNTER — Ambulatory Visit (INDEPENDENT_AMBULATORY_CARE_PROVIDER_SITE_OTHER): Payer: Medicare Other | Admitting: Family Medicine

## 2018-06-22 ENCOUNTER — Other Ambulatory Visit: Payer: Self-pay

## 2018-06-22 VITALS — BP 118/72 | HR 75 | Wt 164.6 lb

## 2018-06-22 DIAGNOSIS — E119 Type 2 diabetes mellitus without complications: Secondary | ICD-10-CM | POA: Diagnosis not present

## 2018-06-22 DIAGNOSIS — Z85118 Personal history of other malignant neoplasm of bronchus and lung: Secondary | ICD-10-CM

## 2018-06-22 DIAGNOSIS — N2889 Other specified disorders of kidney and ureter: Secondary | ICD-10-CM

## 2018-06-22 DIAGNOSIS — Z124 Encounter for screening for malignant neoplasm of cervix: Secondary | ICD-10-CM | POA: Diagnosis not present

## 2018-06-22 DIAGNOSIS — F411 Generalized anxiety disorder: Secondary | ICD-10-CM

## 2018-06-22 DIAGNOSIS — R634 Abnormal weight loss: Secondary | ICD-10-CM

## 2018-06-22 NOTE — Patient Instructions (Signed)
Dear Susan Ingram,   It was very nice to see you! Thank you for taking your time to come in to be seen. Today, we discussed the following:   Screening    I will let you know about your imaging and labs when results return  Please call with any concerns    Be well,   Dr. Zettie Cooley [facility] [phone]   Sign up for MyChart for instant access to your health profile, labs, orders, upcoming appointments or to contact your provider with questions.

## 2018-06-22 NOTE — Progress Notes (Signed)
Established Patient - Clinic Visit  Subjective  Subjective  Patient ID: MRN [id number]  Date of birth: May 02, 1940   PCP: Wilber Oliphant, MD  CC: Weight Loss  HPI:  Susan Ingram is a 78 y.o. female with PMHx significant for lung cancer s/p lobectomy in 2012, who presents to clinic for the following:  #Weight loss  Patient has been experiencing weight loss over the past year, recently.  Patient reports that her clothes are starting to be loose fitting.  Patient has a history of lung cancer, for which she she tried to call the oncologist recently, and she was told that she would need to see her PCP first.  Patient's last chest CT was on 10/02/2013, which showed no evidence of recurrent or metastatic disease.  Since her last visit on March 11, patient has lost 4 pounds.  Prior to that, patient had experienced an 8 on weight loss to be seen on the left.  She is worried that she is getting sick and might have a cancer.  She reports getting a colonoscopy within the last 10 years, she reports that she had a Pap smear once between the time of her hysterectomy (in her 42s) and today.  She does not recall any abnormal lab results.  She requests she is not sure if she has a cervix.    #Changes in stool In addition to weight loss, patient has several other complaints today for which she is worried about.  We have previously talked about constipation in the last couple of months, which was resolved with stool softener and MiraLAX.  She reports that over the last 2 days, her stool has been soft, "like soft serve ice cream".  The last time she took a softener or laxative was last Monday.  She reports the same number of bowel movements, 1-2 a day.  And that there has been no change in the color or width of her bowel movements.  She denies any blood, black tarry stool.  She does not have any family history of colon cancer.  #Left-sided hip pain Patient also reports left-sided hip pain that started 3 weeks ago.   She does recall an event where she was looking behind her washer after a repair man had been to the home to ensure something was fixed correctly.  She reports that she did feel pain in that area at that time and that has since not gone away.  She does not have any other history of trauma, falls, intense physical activity.  It does not keep her from ambulating or going about her day.  She reports that she has been taking Tylenol for the pain.  She has agreed to taking a low-dose oxycodone that she still had from her carpal tunnel surgery.  She says she has not taken it multiple times.  She does report that there is pain with pressing down hard on it and there is pain with certain movements, such as hip flexion.  ROS: See HPI   HISTORY Meds   Allergies   PMHx: Reviewed as appropriate  PMHx: Lung Cancer Social Hx:  Susan Ingram reports that she quit smoking about 25 years ago. She has never used smokeless tobacco. She reports that she does not drink alcohol or use drugs.   Objective  Objective  Physical Exam:  BP 118/72    Pulse 75    Wt 164 lb 9.6 oz (74.7 kg)    SpO2 92%    BMI 26.57 kg/m  General: NAD, non-toxic, well-appearing, sitting comfortably in chair. Cardiovascular: RRR, normal S1, S2. 2+ RP bilaterally. No BLEE  Respiratory: CTAB. No IWOB.   Abdomen: + BS. NT, ND, soft to palpation.  Extremities: Warm and well perfused. Moving spontaneously.  MSK: Patient has pain with palpation of her left lateral hip just under the iliac crest.  There is no bruising or discoloration of the area.  Flexion at the hips to 75 degrees elicits pain.  Her gait is otherwise appears unchanged from previous.  She has full range of movement.   Pertinent Labs & Imaging:  Reviewed in chart as appropriate  CT abdomen pelvis 2015:  4.3 cm right renal cyst with additional nonspecific 8 mm diameter right renal lesion.     Assessment & Plan  History of lung cancer Lungs are clear on exam bilaterally.  Patient  denies any difficulties with breathing, cough, hemoptysis, pleuritic chest pain.  Her last CT was in 2015 and per notes, oncologist reports her lung cancer as "cured".  Patient requests a imaging as she has had no other surveillance on her previous lung cancer and in conjunction with the weight loss, patient fears that she may have recurrence.  If concerning imaging, referral back to oncologist  Generalized anxiety disorder Patient continues to have anxiety about health issues.  This is certainly appropriate in the setting of her family's health history and with her history of lung cancer.  She does report increased anxiety over her weight loss.  However, she is otherwise coping well and I will continue to see her as often as she needs for follow-up.  Stable, no medication   Diabetes ([place]) Patient's blood glucose over the last several lab values have shown appropriate glucose.  A1c is also stable.  Can consider stopping metformin if continued weight loss and stable A1c.  I am unsure if patient would be agreeable to this.  She is very anxious about diabetes as her brother passed away from diabetes related complications.  Renal mass Patient had CT in 2015, which showed a 4.3 cm right renal cyst and a nonspecific 8 mm diameter right renal lesion.  In the setting of her weight loss, I think that it is appropriate for her to obtain repeat imaging for monitoring of nonspecific lesion  Patient scheduled for CT, follow-up imaging  Weight loss, unintentional Patient reports unintentional weight loss over the past year.  It does appear that patient has lost about 20 pounds since December 2018.  This could be due to her improved diet and concern of her diabetes.  This could also be age-related, however, continues to be concerning with the acute recent loss.  Since January 2020, she has lost 13 pounds.  Through our last notes for her diabetes, patient does report some better adherence to her diet, however she  still reports eating a lot of junk food.  She still walks, but has not increased her physical activity.  She does report being more tired recently, but she reports things such as falling asleep on the couch while watching TV.  She has not had any loss or significant gains of sleep.  Can also consider recent stay at home orders in the setting of the pandemic causing her to be depressed and losing appetite.  Patient denies any difficulty obtaining or affording food.  Given history of lung cancer and no recent surveillance, will obtain chest CT.  Additionally, will obtain CT abdomen pelvis for monitoring of nonspecific lesion found in 2015.  Patient  also requests Pap smear today as she has only received 1 in her entire life after her hysterectomy.  Patient does have a cervix and a sample was successfully obtained.  Follow-up Pap and imaging  Can consider further lab testing, will be obtaining labs at next visit for A1c and annual BMP, CBC.  1. Screening for malignant neoplasm of cervix - Cytology - PAP([place])  2. History of lung cancer in adulthood - CT CHEST WO CONTRAST; Future  3. Renal mass - CT ABDOMEN PELVIS W WO CONTRAST; Future  Follow-up:  Future Appointments  Date Time Provider [place]  06/29/2018 12:00 PM [facility] CT 1 [facility]  06/29/2018 12:30 PM [facility] CT 1 [facility]  07/11/2018  9:20 AM [facility] MM 2 [facility]    Zettie Cooley, M.D. [place]   PGY -1 06/24/2018, 3:14 PM

## 2018-06-23 LAB — CYTOLOGY - PAP: Diagnosis: NEGATIVE

## 2018-06-24 ENCOUNTER — Encounter: Payer: Self-pay | Admitting: Family Medicine

## 2018-06-24 DIAGNOSIS — N281 Cyst of kidney, acquired: Secondary | ICD-10-CM

## 2018-06-24 DIAGNOSIS — R634 Abnormal weight loss: Secondary | ICD-10-CM | POA: Insufficient documentation

## 2018-06-24 DIAGNOSIS — Z124 Encounter for screening for malignant neoplasm of cervix: Secondary | ICD-10-CM | POA: Insufficient documentation

## 2018-06-24 DIAGNOSIS — N2889 Other specified disorders of kidney and ureter: Secondary | ICD-10-CM | POA: Insufficient documentation

## 2018-06-24 DIAGNOSIS — Z85118 Personal history of other malignant neoplasm of bronchus and lung: Secondary | ICD-10-CM | POA: Insufficient documentation

## 2018-06-24 HISTORY — DX: Cyst of kidney, acquired: N28.1

## 2018-06-24 HISTORY — DX: Abnormal weight loss: R63.4

## 2018-06-24 NOTE — Assessment & Plan Note (Signed)
Patient's blood glucose over the last several lab values have shown appropriate glucose.  A1c is also stable.  Can consider stopping metformin if continued weight loss and stable A1c.  I am unsure if patient would be agreeable to this.  She is very anxious about diabetes as her brother passed away from diabetes related complications.

## 2018-06-24 NOTE — Assessment & Plan Note (Signed)
Patient continues to have anxiety about health issues.  This is certainly appropriate in the setting of her family's health history and with her history of lung cancer.  She does report increased anxiety over her weight loss.  However, she is otherwise coping well and I will continue to see her as often as she needs for follow-up.  Stable, no medication

## 2018-06-24 NOTE — Assessment & Plan Note (Signed)
Lungs are clear on exam bilaterally.  Patient denies any difficulties with breathing, cough, hemoptysis, pleuritic chest pain.  Her last CT was in 2015 and per notes, oncologist reports her lung cancer as "cured".  Patient requests a imaging as she has had no other surveillance on her previous lung cancer and in conjunction with the weight loss, patient fears that she may have recurrence.  If concerning imaging, referral back to oncologist

## 2018-06-24 NOTE — Assessment & Plan Note (Signed)
Patient had CT in 2015, which showed a 4.3 cm right renal cyst and a nonspecific 8 mm diameter right renal lesion.  In the setting of her weight loss, I think that it is appropriate for her to obtain repeat imaging for monitoring of nonspecific lesion  Patient scheduled for CT, follow-up imaging

## 2018-06-24 NOTE — Assessment & Plan Note (Addendum)
Patient reports unintentional weight loss over the past year.  It does appear that patient has lost about 20 pounds since December 2018.  This could be due to her improved diet and concern of her diabetes.  This could also be age-related, however, continues to be concerning with the acute recent loss.  Since January 2020, she has lost 13 pounds.  Through our last notes for her diabetes, patient does report some better adherence to her diet, however she still reports eating a lot of junk food.  She still walks, but has not increased her physical activity.  She does report being more tired recently, but she reports things such as falling asleep on the couch while watching TV.  She has not had any loss or significant gains of sleep.  Can also consider recent stay at home orders in the setting of the pandemic causing her to be depressed and losing appetite.  Patient denies any difficulty obtaining or affording food.  Given history of lung cancer and no recent surveillance, will obtain chest CT.  Additionally, will obtain CT abdomen pelvis for monitoring of nonspecific lesion found in 2015.  Patient also requests Pap smear today as she has only received 1 in her entire life after her hysterectomy.  Patient does have a cervix and a sample was successfully obtained.  Follow-up Pap and imaging  Can consider further lab testing, will be obtaining labs at next visit for A1c and annual BMP, CBC.  We will continue to monitor weight loss closely  Follow-up in 1 month

## 2018-06-26 ENCOUNTER — Telehealth: Payer: Self-pay

## 2018-06-26 NOTE — Telephone Encounter (Deleted)
Spoke to pt and gave information from Dr. Maudie Mercury. Pt will call back to schedule appt in June for Labs and Dr. Visit. Dr. Maudie Mercury doesn't have a June schedule at this time. Ottis Stain, CMA

## 2018-06-26 NOTE — Telephone Encounter (Signed)
Pt called and informed. Pt will call back in a couple of weeks to schedule an appt for June. Ottis Stain, CMA

## 2018-06-26 NOTE — Telephone Encounter (Signed)
-----   Message from Wilber Oliphant, MD sent at 06/24/2018  3:18 PM EDT ----- Please call patient and let her know that her pap smear was negative. Also, I would encourage her to schedule another appointment in one month for labs, chronic dz check, and monitoring of weight loss.  Thank you!

## 2018-06-29 ENCOUNTER — Ambulatory Visit
Admission: RE | Admit: 2018-06-29 | Discharge: 2018-06-29 | Disposition: A | Discharge status: Home / Self Care | Payer: Medicare Other | Source: Ambulatory Visit | Attending: Family Medicine | Admitting: Family Medicine

## 2018-06-29 DIAGNOSIS — Z85118 Personal history of other malignant neoplasm of bronchus and lung: Secondary | ICD-10-CM

## 2018-06-29 DIAGNOSIS — J439 Emphysema, unspecified: Secondary | ICD-10-CM | POA: Diagnosis not present

## 2018-06-29 DIAGNOSIS — N2889 Other specified disorders of kidney and ureter: Secondary | ICD-10-CM

## 2018-06-29 MED ORDER — IOPAMIDOL (ISOVUE-300) INJECTION 61%
100.0000 mL | Freq: Once | INTRAVENOUS | Status: AC | PRN
  Administered 2018-06-29: 12:00:00 100 mL via INTRAVENOUS

## 2018-06-30 ENCOUNTER — Telehealth: Payer: Self-pay | Admitting: Family Medicine

## 2018-06-30 NOTE — Telephone Encounter (Signed)
Patient aware of Pap and CT results. Will schedule to see me in 1 month for weight loss labs.

## 2018-07-11 ENCOUNTER — Other Ambulatory Visit: Payer: Self-pay

## 2018-07-11 ENCOUNTER — Ambulatory Visit
Admission: RE | Admit: 2018-07-11 | Discharge: 2018-07-11 | Disposition: A | Discharge status: Home / Self Care | Payer: Medicare Other | Source: Ambulatory Visit | Attending: Pediatrics | Admitting: Pediatrics

## 2018-07-11 DIAGNOSIS — Z1231 Encounter for screening mammogram for malignant neoplasm of breast: Secondary | ICD-10-CM

## 2018-07-24 ENCOUNTER — Encounter: Payer: Self-pay | Admitting: Family Medicine

## 2018-07-24 ENCOUNTER — Other Ambulatory Visit: Payer: Self-pay

## 2018-07-24 ENCOUNTER — Ambulatory Visit (INDEPENDENT_AMBULATORY_CARE_PROVIDER_SITE_OTHER): Payer: Medicare Other | Admitting: Family Medicine

## 2018-07-24 VITALS — BP 116/78 | Wt 163.0 lb

## 2018-07-24 DIAGNOSIS — R634 Abnormal weight loss: Secondary | ICD-10-CM | POA: Diagnosis not present

## 2018-07-24 DIAGNOSIS — E119 Type 2 diabetes mellitus without complications: Secondary | ICD-10-CM | POA: Diagnosis not present

## 2018-07-24 LAB — POCT GLYCOSYLATED HEMOGLOBIN (HGB A1C): HbA1c, POC (controlled diabetic range): 6.4 % (ref 0.0–7.0)

## 2018-07-24 NOTE — Assessment & Plan Note (Addendum)
Patient is not worried about her weight today.  She believes that maybe she was eating less.  Though she was very anxious about this at the last appointment, she seems very sure that it was likely due to not eating as much.  She does not want to stop the metformin at this time as she does carry much anxiety about her A1c and hemoglobin as stated in previous notes.  Patient's weight has been stable since last visit from 164 pounds 9.6 ounces to 163 pounds today in the last month. If there is further concern about weight loss,will obtain CBC, BMP, LFT, CRP, ESR, LDH, TSH per recommendations by AAFP. If all results return negative, it would be appropriate to stop further work up and monitor for 3-6 months.

## 2018-07-24 NOTE — Patient Instructions (Signed)
Dear Susan Ingram,   It was very nice to see you! Thank you for taking your time to come in to be seen. Today, we discussed the following:   Weight Loss   I am so glad that you are feeling so well. I think your weight loss might be a combination of many things, but I do not think any further work up needs to be done. Especially if you think you have an idea of why you're losing weight (such as, eating less).   Please follow up in 3-6 months or sooner for concerning or worsening symptoms.   Be well,   Dr. Zettie Cooley [facility] [phone]   Sign up for MyChart for instant access to your health profile, labs, orders, upcoming appointments or to contact your provider with questions.

## 2018-07-24 NOTE — Progress Notes (Signed)
Established Patient - Clinic Visit Subjective  Subjective  Patient ID: MRN [id number]  Date of birth: 05-17-1940   PCP: Wilber Oliphant, MD Name: Susan Ingram, 78 y.o. female  CC: f/u weight loss HPI:  Weight loss follow up  Patient reports feeling well today. She says, "to be honest with you, I did stop eating as much". She is not worried about her weight today.  She reports that the thing that she worries most about is her glucose and A1c.  ROS: See HPI  HISTORY Meds  Allergies: Reviewed as appropriate  Pertinent PMHx: Anxiety, hx of lung cancer (status post resection, most recent CT in May 2020 shows no recurrence). Social Hx: [name] reports that she quit smoking about 25 years ago. She has never used smokeless tobacco. She reports that she does not drink alcohol or use drugs. Social History   Social History Narrative   Current Social History   (Please include date ( . td) when updating information )      Who lives at home: lives alone 02/11/2016    Transportation: rides the bus 02/11/2016   Important Relationships & Pets: daughter and grandson 02/11/2016    Current Stressors: lost job Nov after working 16 years, receives [employer] but not enough to cover her bills  02/11/2016   Work / Education:  Unemployed and looking for work, Working is importation to patient 02/11/2016   Other: Patient wants to remain in her apartment, she has lived there 21 years 02/11/2016                                                                                                                                                                                                               Objective   Objective  Physical Exam:  BP 116/78   Wt 73.9 kg   BMI 26.31 kg/m  General: NAD, non-toxic, well-appearing, sitting comfortably in chair HEENT: [place]/AT. PERRLA. EOMI.  Cardiovascular: RRR, normal S1, S2. B/L 2+ RP.  Soft 1/6 systolic ejection murmur.  No BLEE Respiratory: CTAB. No IWOB.   Abdomen: + BS. NT, ND, soft to palpation.  Extremities: Warm and well perfused. Moving spontaneously.  Integumentary: No obvious rashes, lesions, trauma on general exam. Neuro: A & O x4. CN grossly intact. No FND    Pertinent Labs & Imaging:  CT A/P & lungs: Negative for recurrence of lung cancer and negative for acute renal findings. Hemoglobin A1c today: 6.4 Mammography with no evidence of malignancy.    Assessment  Assessment &  Plan  Weight loss, unintentional Patient is not worried about her weight today.  She believes that maybe she was eating less.  Though she was very anxious about this at the last appointment, she seems very sure that it was likely due to not eating as much.  She does not want to stop the metformin at this time as she does carry much anxiety about her A1c and hemoglobin as stated in previous notes.  Patient's weight has been stable since last visit from 164 pounds 9.6 ounces to 163 pounds today in the last month. If there is further concern about weight loss,will obtain CBC, BMP, LFT, CRP, ESR, LDH, TSH per recommendations by AAFP. If all results return negative, it would be appropriate to stop further work up and monitor for 3-6 months.     Diabetes ([facility]) Stable A1c.  No changes to metformin.  Patient follow-up in 3 to 6 months for repeat A1c per patient preference.    Zettie Cooley, M.D. [place]  PGY -1 07/25/2018, 10:15 PM

## 2018-07-25 ENCOUNTER — Encounter: Payer: Self-pay | Admitting: Family Medicine

## 2018-07-25 NOTE — Assessment & Plan Note (Signed)
Stable A1c.  No changes to metformin.  Patient follow-up in 3 to 6 months for repeat A1c per patient preference.

## 2018-08-01 ENCOUNTER — Other Ambulatory Visit: Payer: Self-pay | Admitting: *Deleted

## 2018-08-01 MED ORDER — FLUTICASONE PROPIONATE 50 MCG/ACT NA SUSP: 2.0000 | Freq: Every day | NASAL | 6 refills | Status: DC

## 2018-10-20 ENCOUNTER — Ambulatory Visit: Payer: Medicare Other | Admitting: Family Medicine

## 2018-10-25 ENCOUNTER — Other Ambulatory Visit: Payer: Self-pay

## 2018-10-25 ENCOUNTER — Encounter: Payer: Self-pay | Admitting: Family Medicine

## 2018-10-25 ENCOUNTER — Ambulatory Visit (INDEPENDENT_AMBULATORY_CARE_PROVIDER_SITE_OTHER): Payer: Medicare Other | Admitting: Family Medicine

## 2018-10-25 VITALS — BP 110/60 | HR 124 | Wt 163.4 lb

## 2018-10-25 DIAGNOSIS — R7303 Prediabetes: Secondary | ICD-10-CM | POA: Diagnosis not present

## 2018-10-25 DIAGNOSIS — R6889 Other general symptoms and signs: Secondary | ICD-10-CM | POA: Diagnosis not present

## 2018-10-25 DIAGNOSIS — Z23 Encounter for immunization: Secondary | ICD-10-CM

## 2018-10-25 DIAGNOSIS — R0989 Other specified symptoms and signs involving the circulatory and respiratory systems: Secondary | ICD-10-CM

## 2018-10-25 LAB — POCT GLYCOSYLATED HEMOGLOBIN (HGB A1C): HbA1c, POC (prediabetic range): 6.2 % (ref 5.7–6.4)

## 2018-10-25 NOTE — Patient Instructions (Signed)
Dear Ilda [facility],   It was good to see you! Thank you for taking your time to come in to be seen. Today, we discussed the following:   Throat Tingle    Come back if this doesn't improve after fixing your dentures   Thank you for getting your flu shot!    Be well,   Zettie Cooley, M.D   [facility] [phone]  *Sign up for MyChart for instant access to your health profile, labs, orders, upcoming appointments or to contact your provider with questions*  ===============================================================

## 2018-10-25 NOTE — Progress Notes (Signed)
   Acute Visit  PCP: Wilber Oliphant, MD Subjective  CC: Throat tingle, Flu Vx, Prediabetes s/u  ZJI:RCVELF Susan Ingram is a 78 y.o. female who presents today with the following problems:  [place] going on for months. Has not tried anything to help. Does try to drink some water. Denies fevers, pain, no sneezing, runny nose. Might have soemthing to do with her teeth-- just got new dentures. Cant chew her food well, but likely due to dentures. No other issues with swallowing.  Prediabetes  Patient would like A1C taken today. Last was three months ago at 6.4. She reports she has been eating many sweets and would not be surprised if the A1C looks bad today. Compliant with medication without any side effects.   ROS: Pertinent ROS included in HPI. Fam Hx: No fam hx of throat cancer  Social Hx: Remote history of smoking 25 years ago.  Objective   Physical Exam:  BP 110/60   Pulse (!) 124   Wt 163 lb 6.4 oz (74.1 kg)   SpO2 97%   BMI 26.37 kg/m  Oropharynx: No erythema or swelling appreciated.  Mild post nasal drip. No tonsillar exudate.  No cobblestoning appreciated.  Patient swallows saliva with no issue.     Assessment   Problem List Items Addressed This Visit      Unprioritized   Throat clearing    Patient reports repeated throat clearing which she thinks is due to her new dentures that don't fit very well. Patient is going to dentist today to hopefully fix this problem. Could also consider allergies as that is very likely at this time of year. Mild post nasal drip on exam, no cobblestoning.  Will continue to follow. No medication at this time. Patient to follow up if worsening.        Other Visit Diagnoses    Prediabetes    -  Primary   Relevant Orders   HgB A1c (Completed)   Need for immunization against influenza       Relevant Orders   Flu Vaccine QUAD 36+ mos IM (Completed)      Follow-up: No return appointment scheduled. Return if problem recurs,   worsens, or new problem develops.    Wilber Oliphant, M.D.  PGY-2  Family Medicine  [phone] 10/26/2018 3:39 PM

## 2018-10-26 ENCOUNTER — Encounter: Payer: Self-pay | Admitting: Family Medicine

## 2018-10-26 NOTE — Assessment & Plan Note (Signed)
Patient reports repeated throat clearing which she thinks is due to her new dentures that don't fit very well. Patient is going to dentist today to hopefully fix this problem. Could also consider allergies as that is very likely at this time of year. Mild post nasal drip on exam, no cobblestoning.  Will continue to follow. No medication at this time. Patient to follow up if worsening.

## 2018-11-10 ENCOUNTER — Other Ambulatory Visit: Payer: Self-pay

## 2018-11-10 NOTE — Patient Outreach (Signed)
[facility]) Care Management  11/10/2018  Kanyla Omeara [facility] 1940-08-12 [id number]   Medication Adherence call to Mrs. Sevanna Ballengee spoke with patient she wants a call back on Monday patient is past due on Atorvastatin 40 mg under [place].   [place] Direct Dial [phone]  Fax [phone] [email or web address]

## 2018-11-12 ENCOUNTER — Other Ambulatory Visit: Payer: Self-pay | Admitting: Family Medicine

## 2018-11-12 DIAGNOSIS — E118 Type 2 diabetes mellitus with unspecified complications: Secondary | ICD-10-CM

## 2018-11-28 ENCOUNTER — Telehealth: Payer: Self-pay

## 2018-11-28 NOTE — Telephone Encounter (Signed)
Pt calling about Metformin being recalled. Pt would like to know if she should take something in place of Metformin. Please call pt at [phone]. Ottis Stain, CMA

## 2018-12-21 ENCOUNTER — Other Ambulatory Visit: Payer: Self-pay | Admitting: Family Medicine

## 2018-12-21 DIAGNOSIS — I10 Essential (primary) hypertension: Secondary | ICD-10-CM

## 2018-12-26 NOTE — Telephone Encounter (Signed)
Pt informed. Keilen Kahl T Audwin Semper, CMA  

## 2019-01-08 NOTE — Progress Notes (Signed)
     Subjective  CC: Diabetes  POE:UMPNTI Susan Ingram is a 78 y.o. female who presents today with the following problems:  Foot Discoloration  Patient reports that she has had some bilateral foot discoloration that she has noticed in the past several weeks.  She reports that the changes in color are a little darker and a little lighter.  She denies any bleeding, ulcerations, injuries to her foot.  She reports that she is worried as we have talked about peripheral neuropathy in the setting of diabetes in the past and she was concerned that this was related to that.  She denies any numbness, tingling, loss of sensation or motor in her lower extremities.  Pertinent PM/FHx: Well-controlled diabetes anxiety, hyperlipidemia Social Hx: Former smoker . Denies ETOH & drug use. Patient reports that her daughter has cancer and that she has been with her a lot and helping her out.   ROS: Pertinent ROS included in HPI. Objective  Physical Exam:  BP 128/78   Pulse 66   Wt 162 lb (73.5 kg)   SpO2 94%   BMI 26.15 kg/m  General: Well-appearing African-American female appears younger than stated age. Feet: 2+ pulses at DPs bilaterally, normal cap refill.  No obvious discoloration of feet.  Intact proprioception and sensation.  Intact motor function and strength bilaterally.  Patient has scar on dorsum of left foot from previous surgery.  Also left phalanx without toenail and dysmorphic from previous surgeries.  Assessment & Plan    Problem List Items Addressed This Visit    At risk for foot problem - Primary    On physical exam, I appreciate no abnormalities of her feet.  I provided reassurance to the patient and return precautions.  We additionally reviewed pictures of diabetic feet and addressed more specifically the items she should look for.  Given her A1c and overall health, it is unlikely that she will have diabetic foot problems in her lifetime.        Discussed with Dr. Andria Frames. Will likely  d/c her ASA as she is primary prevention and would not benefit patient.   Wilber Oliphant, M.D.  2:11 PM 01/13/2019

## 2019-01-09 ENCOUNTER — Ambulatory Visit (INDEPENDENT_AMBULATORY_CARE_PROVIDER_SITE_OTHER): Payer: Medicare Other | Admitting: Family Medicine

## 2019-01-09 ENCOUNTER — Other Ambulatory Visit: Payer: Self-pay

## 2019-01-09 ENCOUNTER — Encounter: Payer: Self-pay | Admitting: Family Medicine

## 2019-01-09 VITALS — BP 128/78 | HR 66 | Wt 162.0 lb

## 2019-01-09 DIAGNOSIS — Z9189 Other specified personal risk factors, not elsewhere classified: Secondary | ICD-10-CM | POA: Diagnosis not present

## 2019-01-09 NOTE — Patient Instructions (Signed)
Dear Susan Ingram,   It was good to see you! Thank you for taking your time to come in to be seen. Today, we discussed the following:   Foot changes   It was good to see you. Please be safe and we'll see you at your next check in  Please call us if there is anything that you need. We will keep you, your daughter and your family in our thoughts and prayers.    Be well,   Zettie Cooley, M.D   [facility] [phone]  *Sign up for MyChart for instant access to your health profile, labs, orders, upcoming appointments or to contact your provider with questions*  ===================================================================================

## 2019-01-13 ENCOUNTER — Encounter: Payer: Self-pay | Admitting: Family Medicine

## 2019-01-13 DIAGNOSIS — Z9189 Other specified personal risk factors, not elsewhere classified: Secondary | ICD-10-CM | POA: Insufficient documentation

## 2019-01-13 NOTE — Assessment & Plan Note (Signed)
On physical exam, I appreciate no abnormalities of her feet.  I provided reassurance to the patient and return precautions.  We additionally reviewed pictures of diabetic feet and addressed more specifically the items she should look for.  Given her A1c and overall health, it is unlikely that she will have diabetic foot problems in her lifetime.

## 2019-02-06 ENCOUNTER — Other Ambulatory Visit: Payer: Self-pay | Admitting: Family Medicine

## 2019-02-06 DIAGNOSIS — J309 Allergic rhinitis, unspecified: Secondary | ICD-10-CM

## 2019-02-06 NOTE — Telephone Encounter (Signed)
Cutting dose in half given patient's age. If poor response, can increase back to 10 mg.

## 2019-02-06 NOTE — Telephone Encounter (Signed)
Pt informed and verbalized understanding.  Talbot Grumbling, RN

## 2019-02-14 ENCOUNTER — Telehealth: Payer: Self-pay

## 2019-02-14 NOTE — Telephone Encounter (Signed)
Patient calling about concerns with taking Zyrtec. Pt states that she is worried about continuing to take medication due to safety. Pt states that she saw that there can be bad side effects and wants to know if she needs to continue taking medication.  Please advise.   To PCP Talbot Grumbling, RN

## 2019-02-14 NOTE — Telephone Encounter (Signed)
She does not have to continue taking this medication and can discontinue whenever she would like. Thank you!

## 2019-02-19 NOTE — Telephone Encounter (Signed)
Patient calls nurse line stating she will continue to take the medication. Patient stated please let her know if she should stop taking it, but will continue as of now.

## 2019-03-12 ENCOUNTER — Other Ambulatory Visit: Payer: Self-pay | Admitting: Family Medicine

## 2019-03-16 ENCOUNTER — Ambulatory Visit (INDEPENDENT_AMBULATORY_CARE_PROVIDER_SITE_OTHER): Payer: Medicare Other | Admitting: Family Medicine

## 2019-03-16 ENCOUNTER — Encounter: Payer: Self-pay | Admitting: Family Medicine

## 2019-03-16 ENCOUNTER — Other Ambulatory Visit: Payer: Self-pay

## 2019-03-16 VITALS — BP 130/76 | HR 77 | Wt 165.0 lb

## 2019-03-16 DIAGNOSIS — K5900 Constipation, unspecified: Secondary | ICD-10-CM | POA: Diagnosis not present

## 2019-03-16 DIAGNOSIS — R7303 Prediabetes: Secondary | ICD-10-CM | POA: Diagnosis not present

## 2019-03-16 DIAGNOSIS — E119 Type 2 diabetes mellitus without complications: Secondary | ICD-10-CM | POA: Insufficient documentation

## 2019-03-16 DIAGNOSIS — R413 Other amnesia: Secondary | ICD-10-CM | POA: Diagnosis not present

## 2019-03-16 DIAGNOSIS — E118 Type 2 diabetes mellitus with unspecified complications: Secondary | ICD-10-CM | POA: Insufficient documentation

## 2019-03-16 HISTORY — DX: Other amnesia: R41.3

## 2019-03-16 LAB — POCT GLYCOSYLATED HEMOGLOBIN (HGB A1C): HbA1c, POC (controlled diabetic range): 6.3 % (ref 0.0–7.0)

## 2019-03-16 MED ORDER — POLYETHYLENE GLYCOL 3350 17 GM/SCOOP PO POWD: 17.0000 g | Freq: Two times a day (BID) | ORAL | 1 refills | Status: DC | PRN

## 2019-03-16 MED ORDER — METAMUCIL 0.36 G PO CAPS: 1.0000 | ORAL_CAPSULE | Freq: Every day | ORAL | 2 refills | Status: DC

## 2019-03-16 NOTE — Assessment & Plan Note (Signed)
Patient is concerned about recent forgetfulness.  Today, she is alert and oriented x4 and without any neurologic deficit.  Patient to return for [facility] appointment.

## 2019-03-16 NOTE — Progress Notes (Signed)
  Subjective  Susan Ingram is a 79 y.o. female who presents to clinic today with the following problems:  A1C Check  6.3 today.   Constipation  Has been having hard stools that are hard to push out. Not taking anything at home for this now. Reports that her food is currently limited to things she can puree due to issues with her dentures.   Memory Difficulty  Brother had alzheimers and dad had some minor memory loss. Patient reports that she has had difficulty with remembering where she puts things and why she goes into certain rooms.  Objective  Physical Exam BP 130/76   Pulse 77   Wt 165 lb (74.8 kg)   SpO2 99%   BMI 26.63 kg/m  General: Well appearing female, NAD.   Assessment & Plan    Problem List Items Addressed This Visit      Active Problems   Constipation    Bowel Regimen:  - PEG BID + psyllium capsules and hydration       Poor short term memory    Patient is concerned about recent forgetfulness.  Today, she is alert and oriented x4 and without any neurologic deficit.  Patient to return for [facility] appointment.      Prediabetes    History of diabetes and strong family history of diabetes.  Patient's A1c is now at prediabetic level.  We will continue to monitor.  Shared decision making about continuing metformin at previous visit.  She will continue taking metformin 500 mg.        Resolved Problems   RESOLVED: Diabetes ([place]) - Primary (Chronic)   Relevant Orders   POCT glycosylated hemoglobin (Hb A1C) (Completed)      Wilber Oliphant, M.D.  5:07 PM 03/16/2019

## 2019-03-16 NOTE — Assessment & Plan Note (Addendum)
Bowel Regimen:  - PEG BID + psyllium capsules and hydration

## 2019-03-16 NOTE — Patient Instructions (Signed)
Dear Ilda Basset [place],   It was good to see you! Thank you for taking your time to come in to be seen. Today, we discussed the following:   Diabetes and Constipation   Your A1C looked great today!   For constipation, take Mirilax Powder Packets twice a day. Start Metamucil capsules daily.   Drink plenty of water   Continue exercise and daily activity   Concerns for memory loss:  . Please follow up in the office for further evaluation   Future Appointments  Date Time Provider [place]  04/09/2019  8:30 AM Wilber Oliphant, MD [facility] [place]    Be well,   Zettie Cooley, M.D   [place] [phone]  *Sign up for MyChart for instant access to your health profile, labs, orders, upcoming appointments or to contact your provider with questions*  ===================================================================================

## 2019-03-16 NOTE — Assessment & Plan Note (Signed)
History of diabetes and strong family history of diabetes.  Patient's A1c is now at prediabetic level.  We will continue to monitor.  Shared decision making about continuing metformin at previous visit.  She will continue taking metformin 500 mg.

## 2019-03-27 ENCOUNTER — Other Ambulatory Visit: Payer: Self-pay | Admitting: Family Medicine

## 2019-03-27 DIAGNOSIS — I10 Essential (primary) hypertension: Secondary | ICD-10-CM

## 2019-03-28 ENCOUNTER — Other Ambulatory Visit: Payer: Self-pay | Admitting: Family Medicine

## 2019-03-28 DIAGNOSIS — I10 Essential (primary) hypertension: Secondary | ICD-10-CM

## 2019-04-09 ENCOUNTER — Other Ambulatory Visit: Payer: Self-pay

## 2019-04-09 ENCOUNTER — Encounter: Payer: Self-pay | Admitting: Family Medicine

## 2019-04-09 ENCOUNTER — Ambulatory Visit (INDEPENDENT_AMBULATORY_CARE_PROVIDER_SITE_OTHER): Payer: Medicare Other | Admitting: Family Medicine

## 2019-04-09 VITALS — BP 116/58 | HR 62 | Ht 66.0 in | Wt 166.6 lb

## 2019-04-09 DIAGNOSIS — G3184 Mild cognitive impairment, so stated: Secondary | ICD-10-CM

## 2019-04-09 DIAGNOSIS — F0390 Unspecified dementia without behavioral disturbance: Secondary | ICD-10-CM | POA: Insufficient documentation

## 2019-04-09 NOTE — Progress Notes (Signed)
   SUBJECTIVE:   CHIEF COMPLAINT / HPI:  Short term memory loss  Over the last two months. Forgets to add certain parts of recipe and then cannot remember where she has placed certain ingredients. Yesterday, was looking for the watch and "tore the place up" and it was on her arm the whole time.   Patient also reports that there is a lot going on with her daughter at this time which is causes her some stress and episodes of depression.  Montreal Cognitive Assessment  04/09/2019  Visuospatial/ Executive (0/5) 1  Naming (0/3) 3  Attention: Read list of digits (0/2) 2  Attention: Read list of letters (0/1) 1  Attention: Serial 7 subtraction starting at 100 (0/3) 0  Language: Repeat phrase (0/2) 0  Language : Fluency (0/1) 0  Abstraction (0/2) 0  Delayed Recall (0/5) 0  Orientation (0/6) 6  Total 13  Adjusted Score (based on education) 55   PERTINENT  PMH / PSH: depression, difficulty sleeping  OBJECTIVE:   BP (!) 116/58   Pulse 62   Ht 5\' 6"  (1.676 m)   Wt 166 lb 9.6 oz (75.6 kg)   SpO2 100%   BMI 26.89 kg/m   Well appearing female. NAD.   ASSESSMENT/PLAN:   Mild cognitive impairment Patient continues to perform IADLs and ADLs, though she does report that she does get help from her daughter for paying 1 bill, her other bills toothpaste by money order.  She is able to manage her medications.  She is also currently taking care of her daughter who was diagnosed with stage IV breast cancer recently.  She does report a lot of stress from this.  Additionally, patient has complained of decreased sleep due to poor bedtime habits.    Spoke to patient about dementia versus mild cognitive impairment.Discussed test results with patient and she is understanding.  Encourage patient to return to care if she has any trouble with her ADLs and IADLs.  -Continue to follow at next appointment for A1c check Return to care if worsening or further worry  Case discussed with Dr. Wendy Poet    Susan Oliphant, MD [place]

## 2019-04-09 NOTE — Assessment & Plan Note (Addendum)
Patient continues to perform IADLs and ADLs, though she does report that she does get help from her daughter for paying 1 bill, her other bills toothpaste by money order.  She is able to manage her medications.  She is also currently taking care of her daughter who was diagnosed with stage IV breast cancer recently.  She does report a lot of stress from this.  Additionally, patient has complained of decreased sleep due to poor bedtime habits.    Spoke to patient about dementia versus mild cognitive impairment.Discussed test results with patient and she is understanding.  Encourage patient to return to care if she has any trouble with her ADLs and IADLs.  -Continue to follow at next appointment for A1c check Return to care if worsening or further worry  Case discussed with Dr. Wendy Poet

## 2019-04-13 ENCOUNTER — Telehealth: Payer: Self-pay

## 2019-04-13 NOTE — Telephone Encounter (Signed)
Patient calls nurse line regarding increased sneezing over the last couple of days. Patient denies any other symptoms at this time. Informed patient that this sounds like allergies and to continue using prescribed allergy medication. Patient reports discontinuing use of nasal spray, however, with increased sneezing, has started using again. Advised patient to continue current course and to call back if any new symptoms appear.   Patient appreciative and verbalized understanding.   To PCP  Talbot Grumbling, RN

## 2019-04-16 DIAGNOSIS — Z961 Presence of intraocular lens: Secondary | ICD-10-CM | POA: Diagnosis not present

## 2019-04-16 DIAGNOSIS — E119 Type 2 diabetes mellitus without complications: Secondary | ICD-10-CM | POA: Diagnosis not present

## 2019-04-16 DIAGNOSIS — H40013 Open angle with borderline findings, low risk, bilateral: Secondary | ICD-10-CM | POA: Diagnosis not present

## 2019-04-16 LAB — HM DIABETES EYE EXAM

## 2019-05-15 ENCOUNTER — Other Ambulatory Visit: Payer: Self-pay | Admitting: Family Medicine

## 2019-05-15 DIAGNOSIS — Z1231 Encounter for screening mammogram for malignant neoplasm of breast: Secondary | ICD-10-CM

## 2019-05-16 ENCOUNTER — Ambulatory Visit (INDEPENDENT_AMBULATORY_CARE_PROVIDER_SITE_OTHER): Payer: Medicare Other | Admitting: Family Medicine

## 2019-05-16 ENCOUNTER — Other Ambulatory Visit: Payer: Self-pay

## 2019-05-16 VITALS — BP 125/65 | HR 63 | Ht 63.0 in | Wt 168.8 lb

## 2019-05-16 DIAGNOSIS — N644 Mastodynia: Secondary | ICD-10-CM

## 2019-05-16 NOTE — Patient Instructions (Signed)
1. I have ordered a mammogram 2. Please call to schedule this 3. Follow up in 2-4 weeks

## 2019-05-16 NOTE — Assessment & Plan Note (Signed)
Patient with breast tenderness.  Abnormal breast exam.  Small nodule palpated in right upper outer border.  There was tenderness on palpation.  Given abnormal finding as well as breast tenderness will get diagnostic mammogram.  Patient is agreeable with this.  Patient plans to call breast center today to schedule this.  Phone number for breast center also given to patient.  Strict return precautions given.  Advised conservative measures at this time such as using heat/ice or Tylenol/ibuprofen as needed for pain.  Follow-up in 1 month.

## 2019-05-16 NOTE — Progress Notes (Addendum)
    SUBJECTIVE:   CHIEF COMPLAINT / HPI:   Breast soreness Patient presenting for concern of breast soreness.  Has been occurring x2 weeks.  States that she called the breast center to get imaging but was told she had to see her physician first.  States that it is on the right side under her nipple.  States that it feels like it is rubbing areas.  Her area under her breast also hurts as well.  Maternal aunt has a history of breast cancer, had bilateral mastectomy.  Paternal cousin has a history of breast cancer, had unilateral mastectomy.  Her daughter has a history of cervical cancer.  No family history of any colon or endometrial cancers.  Denies any nipple discharge.  Denies any rashes or sores.  Denies any redness or swelling.  Denies any skin changes.  Does lay on her right side so is wondering if that caused the pain.  PERTINENT  PMH / PSH: GAD, MDD, mild cognitive impairment, HTN, asthma  OBJECTIVE:   BP 125/65   Pulse 63   Ht 5\' 3"  (1.6 m)   Wt 168 lb 12.8 oz (76.6 kg)   SpO2 99%   BMI 29.90 kg/m   Breasts: left breast normal without mass, skin or nipple changes or axillary nodes; abnormal mass palpable in right breast in upper outer corner, tender to palpation. Chaperone present   ASSESSMENT/PLAN:   Breast tenderness in female Patient with breast tenderness.  Abnormal breast exam.  Small nodule palpated in right upper outer border.  There was tenderness on palpation.  Given abnormal finding as well as breast tenderness will get diagnostic mammogram.  Patient is agreeable with this.  Patient plans to call breast center today to schedule this.  Phone number for breast center also given to patient.  Strict return precautions given.  Advised conservative measures at this time such as using heat/ice or Tylenol/ibuprofen as needed for pain.  Follow-up in 1 month.     Caroline More, [place]

## 2019-05-17 ENCOUNTER — Other Ambulatory Visit: Payer: Self-pay | Admitting: Family Medicine

## 2019-05-17 DIAGNOSIS — N644 Mastodynia: Secondary | ICD-10-CM

## 2019-05-25 ENCOUNTER — Other Ambulatory Visit: Payer: Self-pay | Admitting: Family Medicine

## 2019-06-04 ENCOUNTER — Other Ambulatory Visit: Payer: Self-pay

## 2019-06-04 ENCOUNTER — Ambulatory Visit: Payer: Medicare Other

## 2019-06-04 ENCOUNTER — Ambulatory Visit
Admission: RE | Admit: 2019-06-04 | Discharge: 2019-06-04 | Disposition: A | Discharge status: Home / Self Care | Payer: Medicare Other | Source: Ambulatory Visit | Attending: Family Medicine | Admitting: Family Medicine

## 2019-06-04 DIAGNOSIS — N644 Mastodynia: Secondary | ICD-10-CM

## 2019-06-04 DIAGNOSIS — R928 Other abnormal and inconclusive findings on diagnostic imaging of breast: Secondary | ICD-10-CM | POA: Diagnosis not present

## 2019-06-19 ENCOUNTER — Other Ambulatory Visit: Payer: Self-pay | Admitting: Family Medicine

## 2019-07-12 ENCOUNTER — Ambulatory Visit
Admission: RE | Admit: 2019-07-12 | Discharge: 2019-07-12 | Disposition: A | Discharge status: Home / Self Care | Payer: Medicare Other | Source: Ambulatory Visit | Attending: *Deleted | Admitting: *Deleted

## 2019-07-12 ENCOUNTER — Other Ambulatory Visit: Payer: Self-pay

## 2019-07-12 DIAGNOSIS — Z1231 Encounter for screening mammogram for malignant neoplasm of breast: Secondary | ICD-10-CM | POA: Diagnosis not present

## 2019-07-17 ENCOUNTER — Other Ambulatory Visit: Payer: Self-pay | Admitting: Family Medicine

## 2019-07-17 DIAGNOSIS — J309 Allergic rhinitis, unspecified: Secondary | ICD-10-CM

## 2019-08-14 ENCOUNTER — Other Ambulatory Visit: Payer: Self-pay | Admitting: Family Medicine

## 2019-08-14 DIAGNOSIS — E118 Type 2 diabetes mellitus with unspecified complications: Secondary | ICD-10-CM

## 2019-09-03 ENCOUNTER — Ambulatory Visit: Payer: Medicare Other | Admitting: Family Medicine

## 2019-09-03 ENCOUNTER — Ambulatory Visit: Payer: Medicare Other

## 2019-09-03 NOTE — Progress Notes (Deleted)
    SUBJECTIVE:   CHIEF COMPLAINT / HPI:   Hurting in thigh and neck Patient is an 79 year old female that presents today to discuss pain in her neck and***thigh.  PERTINENT  PMH / PSH: ***  OBJECTIVE:   There were no vitals taken for this visit.   General: NAD, pleasant, able to participate in exam Cardiac: RRR, no murmurs. Respiratory: CTAB, normal effort, No wheezes, rales or rhonchi Abdomen: Bowel sounds present, nontender, nondistended, no hepatosplenomegaly. Extremities: no edema or cyanosis. Skin: warm and dry, no rashes noted Neuro: alert, no obvious focal deficits Psych: Normal affect and mood  ASSESSMENT/PLAN:   No problem-specific Assessment & Plan notes found for this encounter.     Lurline Del, [place]

## 2019-09-04 ENCOUNTER — Ambulatory Visit (INDEPENDENT_AMBULATORY_CARE_PROVIDER_SITE_OTHER): Payer: Medicare Other | Admitting: Family Medicine

## 2019-09-04 ENCOUNTER — Other Ambulatory Visit: Payer: Self-pay

## 2019-09-04 VITALS — BP 138/74 | HR 64 | Ht 63.0 in | Wt 174.8 lb

## 2019-09-04 DIAGNOSIS — L299 Pruritus, unspecified: Secondary | ICD-10-CM | POA: Diagnosis not present

## 2019-09-04 DIAGNOSIS — E119 Type 2 diabetes mellitus without complications: Secondary | ICD-10-CM

## 2019-09-04 LAB — POCT GLYCOSYLATED HEMOGLOBIN (HGB A1C): HbA1c, POC (controlled diabetic range): 6.4 % (ref 0.0–7.0)

## 2019-09-04 MED ORDER — HYDROCORTISONE 1 % EX LOTN
1.0000 | TOPICAL_LOTION | Freq: Two times a day (BID) | CUTANEOUS | 0 refills | Status: DC
Start: 2019-09-04 — End: 2019-12-12

## 2019-09-04 NOTE — Assessment & Plan Note (Signed)
A1c 6.3 indicating prediabetes, reassured patient that this is stable from previous A1c. Continue metformin 500mg  once daily. Diet counseling provided to cut back on sugar food and sodas. Pt is in agreement. Follow up with PCP.

## 2019-09-04 NOTE — Assessment & Plan Note (Signed)
Prescribed 1% hydrocortisone for pruritus symptoms for presumed skin sensitivities due to allergies. No obvious rash on exam today. Follow up with PCP if no improvement in symptoms.

## 2019-09-04 NOTE — Progress Notes (Signed)
    SUBJECTIVE:   CHIEF COMPLAINT / HPI:   Susan Ingram is an 79 yr old female who presents today for a prediabetes check  Prediabetes Last A1c was 6.3 in January 2021. She would like her A1c checked today. She takes Metformin 500mg  once daily with breakfast. Tolerating well without side effects. Denies polydipsia, vision changes or abdominal pain. Has nocturia sometimes.  Itching of skin Reports seasonal skin allergies which are causing itching of her skin on her neck. Would like a cream for her itching rather than a pill.   PERTINENT  PMH / PSH: Prediabetes, allergic rhinitis, Asthma  OBJECTIVE:   BP 138/74   Pulse 64   Ht 5\' 3"  (1.6 m)   Wt 174 lb 12.8 oz (79.3 kg)   SpO2 97%   BMI 30.96 kg/m    General: Alert, no acute distress, pleasant  HEENT: no obvious erythema or dryness over neck Cardio: well perfused    Pulm: Normal respiratory effort Neuro: Cranial nerves grossly intact  ASSESSMENT/PLAN:   Prediabetes A1c 6.3 indicating prediabetes, reassured patient that this is stable from previous A1c. Continue metformin 500mg  once daily. Diet counseling provided to cut back on sugar food and sodas. Pt is in agreement. Follow up with PCP.  Itching Prescribed 1% hydrocortisone for pruritus symptoms for presumed skin sensitivities due to allergies. No obvious rash on exam today. Follow up with PCP if no improvement in symptoms.      Lattie Haw, MD [place]

## 2019-09-04 NOTE — Patient Instructions (Signed)
Great to see you today. Your test showed you are still prediabetic but it is stable. Please reduce the sugary foods and drinks you have in your diet. I have prescribed an anti itch cream for your seasonal allergies.   Please follow up with your PCP for other concerns.  Best wishes,  Dr Posey Pronto

## 2019-09-06 ENCOUNTER — Ambulatory Visit: Payer: Medicare Other | Admitting: Family Medicine

## 2019-09-18 ENCOUNTER — Other Ambulatory Visit: Payer: Self-pay | Admitting: Family Medicine

## 2019-12-12 ENCOUNTER — Encounter: Payer: Self-pay | Admitting: Family Medicine

## 2019-12-12 ENCOUNTER — Ambulatory Visit (INDEPENDENT_AMBULATORY_CARE_PROVIDER_SITE_OTHER): Payer: Medicare Other | Admitting: Family Medicine

## 2019-12-12 ENCOUNTER — Other Ambulatory Visit: Payer: Self-pay

## 2019-12-12 VITALS — BP 110/60 | HR 71 | Ht 63.0 in | Wt 173.6 lb

## 2019-12-12 DIAGNOSIS — B078 Other viral warts: Secondary | ICD-10-CM

## 2019-12-12 DIAGNOSIS — R7303 Prediabetes: Secondary | ICD-10-CM | POA: Diagnosis not present

## 2019-12-12 DIAGNOSIS — E782 Mixed hyperlipidemia: Secondary | ICD-10-CM

## 2019-12-12 DIAGNOSIS — I10 Essential (primary) hypertension: Secondary | ICD-10-CM | POA: Diagnosis not present

## 2019-12-12 LAB — POCT GLYCOSYLATED HEMOGLOBIN (HGB A1C): Hemoglobin A1C: 6.4 % — AB (ref 4.0–5.6)

## 2019-12-12 MED ORDER — ALBUTEROL SULFATE HFA 108 (90 BASE) MCG/ACT IN AERS: 2.0000 | INHALATION_SPRAY | Freq: Four times a day (QID) | RESPIRATORY_TRACT | 1 refills | Status: DC | PRN

## 2019-12-12 NOTE — Assessment & Plan Note (Signed)
Patient declines any treatment for this today.  No worrisome signs on exam today.  Does not appear infected.  Can consider treatment in the future if patient desires.

## 2019-12-12 NOTE — Patient Instructions (Signed)
We can think about taking off the high blood pressure medication (chlorithalidone) in the future. Otherwise, I agree with stopping the aspirin.  We will check some labs today, I will call you with results and if we need to change any medications.

## 2019-12-12 NOTE — Assessment & Plan Note (Signed)
Patient is well within goal parameters of blood pressure.  Given her age, discussed with patient that we can consider trialing off of medication in the future.

## 2019-12-12 NOTE — Progress Notes (Signed)
    SUBJECTIVE:  CHIEF COMPLAINT / HPI:   Wart Patient presents with wart located periungual he on her right pinky.  She reports that this has been present for over 25 years.  She reports that it has been treated with cryotherapy multiple times without success.  She denies any bothersome symptoms.  It is not painful.  She declines any cryotherapy or further treatment for today.  She just want to make sure that it was okay.  Primary hypertension Patient reports compliance with her medication.  She is currently taking chlorthalidone 25 mg.  She denies any side effects.  She does not take her blood pressures at home.  Her blood pressures in clinic have been well controlled.  Today 110/60.  Denies any chest pain, shortness of breath, dyspnea on exertion, dizziness.  Prediabetes Last A1c on 09/04/2019 6.4.  Patient continues to take Metformin and atorvastatin daily.  PERTINENT  PMH / PSH: Hyperlipidemia, prediabetes   OBJECTIVE:  BP 110/60   Pulse 71   Ht 5\' 3"  (1.6 m)   Wt 173 lb 9.6 oz (78.7 kg)   SpO2 98%   BMI 30.75 kg/m   General: Well-appearing female, no acute distress Right fifth finger: Small area of hyperkeratinization just lateral to nail in periungual space (radial aspect).  No tenderness to palpation, no erythema, no draining.  ASSESSMENT/PLAN:  Periungual wart Patient declines any treatment for this today.  No worrisome signs on exam today.  Does not appear infected.  Can consider treatment in the future if patient desires.  Hypertension Patient is well within goal parameters of blood pressure.  Given her age, discussed with patient that we can consider trialing off of medication in the future.   Prediabetes Repeat A1c today.  Patient continues to take Metformin daily.  Patient also takes atorvastatin daily.    Wilber Oliphant, MD [place]

## 2019-12-12 NOTE — Assessment & Plan Note (Signed)
Repeat A1c today.  Patient continues to take Metformin daily.  Patient also takes atorvastatin daily.

## 2019-12-13 LAB — LIPID PANEL
Chol/HDL Ratio: 2.1 ratio (ref 0.0–4.4)
Cholesterol, Total: 118 mg/dL (ref 100–199)
HDL: 56 mg/dL (ref >39)
LDL Chol Calc (NIH): 48 mg/dL (ref 0–99)
Triglycerides: 68 mg/dL (ref 0–149)
VLDL Cholesterol Cal: 14 mg/dL (ref 5–40)

## 2019-12-13 LAB — BASIC METABOLIC PANEL
BUN/Creatinine Ratio: 12 (ref 12–28)
BUN: 11 mg/dL (ref 8–27)
CO2: 27 mmol/L (ref 20–29)
Calcium: 10.9 mg/dL — ABNORMAL HIGH (ref 8.7–10.3)
Chloride: 98 mmol/L (ref 96–106)
Creatinine, Ser: 0.9 mg/dL (ref 0.57–1.00)
GFR calc Af Amer: 70 mL/min/{1.73_m2} (ref >59)
GFR calc non Af Amer: 61 mL/min/{1.73_m2} (ref >59)
Glucose: 111 mg/dL — ABNORMAL HIGH (ref 65–99)
Potassium: 3.7 mmol/L (ref 3.5–5.2)
Sodium: 138 mmol/L (ref 134–144)

## 2019-12-21 ENCOUNTER — Ambulatory Visit: Payer: Medicare Other | Admitting: Family Medicine

## 2019-12-24 ENCOUNTER — Other Ambulatory Visit: Payer: Self-pay

## 2019-12-24 ENCOUNTER — Ambulatory Visit (INDEPENDENT_AMBULATORY_CARE_PROVIDER_SITE_OTHER): Payer: Medicare Other | Admitting: Family Medicine

## 2019-12-24 ENCOUNTER — Encounter: Payer: Self-pay | Admitting: Family Medicine

## 2019-12-24 VITALS — BP 122/56 | HR 79 | Wt 177.2 lb

## 2019-12-24 DIAGNOSIS — L299 Pruritus, unspecified: Secondary | ICD-10-CM | POA: Diagnosis not present

## 2019-12-24 DIAGNOSIS — R748 Abnormal levels of other serum enzymes: Secondary | ICD-10-CM | POA: Diagnosis not present

## 2019-12-24 DIAGNOSIS — Z23 Encounter for immunization: Secondary | ICD-10-CM | POA: Diagnosis not present

## 2019-12-24 MED ORDER — HYDROCORTISONE 2.5 % EX OINT: TOPICAL_OINTMENT | Freq: Two times a day (BID) | CUTANEOUS | 0 refills | Status: DC

## 2019-12-24 NOTE — Addendum Note (Signed)
Addended by: Owens Shark, Lexander Tremblay on: 12/24/2019 07:02 PM   Modules accepted: Level of Service

## 2019-12-24 NOTE — Assessment & Plan Note (Signed)
Pt complains of whole body itching which has worsened over the past week. It is worse on her scalp and she rates the itching at an 8/10. It is worse at night when trying to sleep.  She denies using any new products such as soaps, lotions, detergents, or eating any new foods.  She has not noticed a rash or bumps on any part of her body. She has had this whole body itching before and was prescribed 1% hydrocortisone.  -She will be prescribed 2.5% hydrocortisone today.  -CMP ordered to check liver function and calcium as it was slightly elevated at 10.9 at last apt.

## 2019-12-24 NOTE — Patient Instructions (Signed)
Itching: I am sorry that you have had such intense itching.  I recommend you stop taking the new multivitamin to see if that helps.  In the meantime, we will get some lab work to make sure that your liver is not contributing to this itching.  I have also prescribed you a stronger topical steroid to help with the itching.  I do not think that there is a pill that will easily solve the itching issue.   We have gotten some additional lab work today I will let you know if there are any abnormalities.

## 2019-12-24 NOTE — Progress Notes (Addendum)
    SUBJECTIVE:   CHIEF COMPLAINT / HPI:  Pt complains of whole body itching which has worsened over the past week. It is worse on her scalp and she rates the itching at an 8/10. It is worse at night when trying to sleep.  She denies using any new products such as soaps, lotions, detergents, or eating any new foods.  She has not noticed a rash or bumps on any part of her body.   PERTINENT  PMH / PSH: Prediabetes, HTN, HLD  OBJECTIVE:   BP (!) 122/56   Pulse 79   Wt 177 lb 4 oz (80.4 kg)   SpO2 97%   BMI 31.40 kg/m   General: alert, in no acute distress Heart: RRR, no murmurs, normal S1 and S2 Lungs: CTA bilaterally Abdomen: non-tender, non distended  Skin: no excoriations, no rash Extremities: no edema  ASSESSMENT/PLAN:   Itching Pt complains of whole body itching which has worsened over the past week. It is worse on her scalp and she rates the itching at an 8/10. It is worse at night when trying to sleep.  She denies using any new products such as soaps, lotions, detergents, or eating any new foods.  She has not noticed a rash or bumps on any part of her body. She has had this whole body itching before and was prescribed 1% hydrocortisone.  -She will be prescribed 2.5% hydrocortisone today.  -CMP ordered to check liver function and calcium as it was slightly elevated at 10.9 at last apt.       Micheline Rough, Medical Student [place]    I was present for the medical decision making and physical exam of the above visit.  I agree with the above documentation with the following additions:  Pruritus No evidence of rash or significant excoriations.  She specifically denies abdominal discomfort.  Chart review shows that she has had trouble with pruritus in the past which does not seem to significantly improved with her hydrocortisone 1%.  For now, we will move forward with the basic work-up and assess her liver function and hemoglobin.  Overall, lower  suspicion for cholestasis, hyperbilirubinemia or polycythemia.  If labs are normal, we will move forward with continued topical treatment with a stronger steroid.  I am hesitant to add any antihistamine medication due to age.  If her discomfort persists, may be helpful to trial gabapentin. -Follow-up CMP, CBC -Hydrocortisone 2.5% ointment as needed

## 2019-12-25 ENCOUNTER — Other Ambulatory Visit: Payer: Self-pay | Admitting: Family Medicine

## 2019-12-25 DIAGNOSIS — R748 Abnormal levels of other serum enzymes: Secondary | ICD-10-CM

## 2019-12-25 LAB — CBC
Hematocrit: 38.8 % (ref 34.0–46.6)
Hemoglobin: 13.2 g/dL (ref 11.1–15.9)
MCH: 28.1 pg (ref 26.6–33.0)
MCHC: 34 g/dL (ref 31.5–35.7)
MCV: 83 fL (ref 79–97)
Platelets: 269 10*3/uL (ref 150–450)
RBC: 4.69 x10E6/uL (ref 3.77–5.28)
RDW: 13.4 % (ref 11.7–15.4)
WBC: 6.5 10*3/uL (ref 3.4–10.8)

## 2019-12-25 LAB — COMPREHENSIVE METABOLIC PANEL
ALT: 25 IU/L (ref 0–32)
AST: 32 IU/L (ref 0–40)
Albumin/Globulin Ratio: 1.6 (ref 1.2–2.2)
Albumin: 4.3 g/dL (ref 3.7–4.7)
Alkaline Phosphatase: 132 IU/L — ABNORMAL HIGH (ref 44–121)
BUN/Creatinine Ratio: 14 (ref 12–28)
BUN: 12 mg/dL (ref 8–27)
Bilirubin Total: 0.5 mg/dL (ref 0.0–1.2)
CO2: 29 mmol/L (ref 20–29)
Calcium: 10.2 mg/dL (ref 8.7–10.3)
Chloride: 97 mmol/L (ref 96–106)
Creatinine, Ser: 0.83 mg/dL (ref 0.57–1.00)
GFR calc Af Amer: 77 mL/min/{1.73_m2} (ref >59)
GFR calc non Af Amer: 67 mL/min/{1.73_m2} (ref >59)
Globulin, Total: 2.7 g/dL (ref 1.5–4.5)
Glucose: 102 mg/dL — ABNORMAL HIGH (ref 65–99)
Potassium: 3.4 mmol/L — ABNORMAL LOW (ref 3.5–5.2)
Sodium: 138 mmol/L (ref 134–144)
Total Protein: 7 g/dL (ref 6.0–8.5)

## 2019-12-27 LAB — GAMMA GT: GGT: 25 IU/L (ref 0–60)

## 2019-12-27 LAB — SPECIMEN STATUS REPORT

## 2019-12-31 ENCOUNTER — Telehealth: Payer: Self-pay

## 2019-12-31 NOTE — Telephone Encounter (Signed)
Patient returns call to nurse line regarding missed call from provider on Friday. Patient apologizes for missing the call as she has been in the hospital with a sick child. Patient requesting returned call from provider to discuss results at [phone].   To Dr. Thornton Park, RN

## 2020-01-02 ENCOUNTER — Other Ambulatory Visit: Payer: Self-pay | Admitting: Family Medicine

## 2020-01-30 ENCOUNTER — Other Ambulatory Visit: Payer: Self-pay | Admitting: Family Medicine

## 2020-01-30 DIAGNOSIS — J309 Allergic rhinitis, unspecified: Secondary | ICD-10-CM

## 2020-02-19 ENCOUNTER — Encounter: Payer: Self-pay | Admitting: Family Medicine

## 2020-02-19 ENCOUNTER — Ambulatory Visit (INDEPENDENT_AMBULATORY_CARE_PROVIDER_SITE_OTHER): Payer: Medicare Other | Admitting: Family Medicine

## 2020-02-19 VITALS — BP 120/78 | HR 67 | Ht 63.0 in | Wt 173.4 lb

## 2020-02-19 DIAGNOSIS — B078 Other viral warts: Secondary | ICD-10-CM | POA: Diagnosis not present

## 2020-02-19 DIAGNOSIS — F329 Major depressive disorder, single episode, unspecified: Secondary | ICD-10-CM | POA: Diagnosis not present

## 2020-02-19 DIAGNOSIS — F411 Generalized anxiety disorder: Secondary | ICD-10-CM | POA: Diagnosis not present

## 2020-02-19 MED ORDER — OMEPRAZOLE 20 MG PO CPDR: 20.0000 mg | DELAYED_RELEASE_CAPSULE | Freq: Every day | ORAL | 3 refills | Status: DC

## 2020-02-19 NOTE — Patient Instructions (Addendum)
Cough  Start taking omeprazole 20 mg daily to see if this helps with your cough. Lets follow up in 1-2 months to see how you're doing.   Wart Referral to dermatology

## 2020-02-19 NOTE — Progress Notes (Signed)
    SUBJECTIVE:  CHIEF COMPLAINT / HPI:   1. Periungual wart Patient reports that her wart worsened and swelled proximally over the length of the right pinky. She reports that she has been able to express pus. She denies any warmth, erythema. She does report peeling skin around the area. No pain.   2. Depression related to illness in family  Patient reports that her daughter, who was previously in remission after chemotherapy from stage 4 cervical cancer, has had recurrence and recently had new cancer found in her lungs. The daughter is scheduled for biopsy tomorrow. Patient reports that she has been crying a lot, but tries to stay strong for the daughter, as the daughter is a very strong willed woman. She reports that she feels very badly for the daughter and all of the pain she is experiencing. She attends all of the appointments with the daughter and reports good support from family and friends.   PERTINENT  PMH / PSH: GAD, depression  OBJECTIVE:  BP 120/78   Pulse 67   Ht 5\' 3"  (1.6 m)   Wt 173 lb 6 oz (78.6 kg)   SpO2 97%   BMI 30.71 kg/m   General: Well appearing female, tearful when speaking about her daughter.  Integumentary: see photo below. No warmth, swelling, erythema today. A small pinpoint amount of creamy material expressed.  Psych: Patient is well groomed with good hygiene.  Speech is normal with normal rate, latency, volume and intonation.  Behavior is normal with normal eye contact.  Patient is friendly, cooperative.  Tight and logical thought process is.  No abnormal thought content.  Mood is good with appropriate affect and range of emotion.    ASSESSMENT/PLAN:  Reactive depression (situational) Patient's symptoms seem directly related to daughter's illness. She is not currently in counseling, but does report very good support form friends and family. Offered counseling and therapy today which the patient declines. When not discussing her daughter, she otherwise  appears in good spirits, appropriate affect. Will continue to monitor. Patient to call/return if she would like further support from counseling/therapy.   Periungual wart Wart present for over 30 years. Previously, treatment has been unsuccessful. Patient previously wanting to treat if symptomatic. Given long standing wart refractory to multiple treatments, will send to dermatology. Referral placed.     Wilber Oliphant, MD [place]

## 2020-02-20 DIAGNOSIS — F329 Major depressive disorder, single episode, unspecified: Secondary | ICD-10-CM | POA: Insufficient documentation

## 2020-02-20 NOTE — Assessment & Plan Note (Signed)
Wart present for over 30 years. Previously, treatment has been unsuccessful. Patient previously wanting to treat if symptomatic. Given long standing wart refractory to multiple treatments, will send to dermatology. Referral placed.

## 2020-02-20 NOTE — Assessment & Plan Note (Signed)
Patient's symptoms seem directly related to daughter's illness. She is not currently in counseling, but does report very good support form friends and family. Offered counseling and therapy today which the patient declines. When not discussing her daughter, she otherwise appears in good spirits, appropriate affect. Will continue to monitor. Patient to call/return if she would like further support from counseling/therapy.

## 2020-03-31 ENCOUNTER — Other Ambulatory Visit: Payer: Self-pay | Admitting: Family Medicine

## 2020-03-31 DIAGNOSIS — I10 Essential (primary) hypertension: Secondary | ICD-10-CM

## 2020-04-07 ENCOUNTER — Other Ambulatory Visit: Payer: Self-pay | Admitting: Family Medicine

## 2020-04-10 ENCOUNTER — Ambulatory Visit (INDEPENDENT_AMBULATORY_CARE_PROVIDER_SITE_OTHER): Payer: Medicare Other | Admitting: Family Medicine

## 2020-04-10 ENCOUNTER — Other Ambulatory Visit: Payer: Self-pay

## 2020-04-10 VITALS — BP 144/82 | HR 61 | Ht 63.0 in | Wt 172.2 lb

## 2020-04-10 DIAGNOSIS — E876 Hypokalemia: Secondary | ICD-10-CM

## 2020-04-10 DIAGNOSIS — R748 Abnormal levels of other serum enzymes: Secondary | ICD-10-CM

## 2020-04-10 DIAGNOSIS — R7303 Prediabetes: Secondary | ICD-10-CM

## 2020-04-10 HISTORY — DX: Abnormal levels of other serum enzymes: R74.8

## 2020-04-10 HISTORY — DX: Hypokalemia: E87.6

## 2020-04-10 LAB — POCT GLYCOSYLATED HEMOGLOBIN (HGB A1C): Hemoglobin A1C: 6.3 % — AB (ref 4.0–5.6)

## 2020-04-10 NOTE — Assessment & Plan Note (Signed)
Potassium slightly low on last check at 3.4.  We will follow up on CMP today.

## 2020-04-10 NOTE — Patient Instructions (Signed)
Thank you for coming to see me today. It was a pleasure. Today we talked about:   We will get some labs today.  If they are abnormal or we need to do something about them, I will call you.  If they are normal, I will send you a message on MyChart (if it is active) or a letter in the mail.  If you don't hear from [place] in 2 weeks, please call the office at the number below.  Please follow-up with PCP in 3 months.  If you have any questions or concerns, please do not hesitate to call the office at [phone].  Best,   [place] Constable, DO

## 2020-04-10 NOTE — Assessment & Plan Note (Addendum)
Alk phos previously mildly elevated with GGT normal.  We will follow-up with a CMP today to ensure that there is no increase.  If increasing, could consider vitamin D check as well as low levels of vitamin D can cause elevated alk phos.

## 2020-04-10 NOTE — Assessment & Plan Note (Signed)
A1c today still in prediabetic range at 6.3.  Patient very happy about this.  We will continue with her current regimen of Metformin 500 mg daily.  She is up-to-date on lipid panel.  We will repeat CMP per below.  Patient would like to follow-up in 3 months again for another A1c as she likes to get these every 3 months.

## 2020-04-10 NOTE — Progress Notes (Signed)
    SUBJECTIVE:   CHIEF COMPLAINT / HPI:   Prediabetes Current regimen: Metformin 500 mg daily Last A1c 6.4 on 12/12/2019 CBGs doesn't check Feeling okay otherwise Sees an eye doctor regularly, next month is next appt Doing well, ust wanted to come in for A1c since it had been awhile She is very active in her house Goes up and down her steps a lot Last Lipid panel 12/12/19, LDL 48  Hypokalemia Last BMP on 11/8 with potassium of 3.4  Elevated alk phos On CMP on 11/8, alk phos was 132, GGT was not elevated Recommended repeat at next visit to ensure not trending upward  PERTINENT  PMH / PSH: Hypertension, asthma, major depressive disorder, hyperlipidemia, GERD, prediabetes  OBJECTIVE:   BP (!) 144/82   Pulse 61   Ht $R'5\' 3"'Ro$  (1.6 m)   Wt 172 lb 3.2 oz (78.1 kg)   SpO2 96%   BMI 30.50 kg/m    Physical Exam:  General: 80 y.o. female in NAD Cardio: RRR no m/r/g Lungs: CTAB, no wheezing, no rhonchi, no crackles, no IWOB on RA Skin: warm and dry Extremities: No edema  Results for orders placed or performed in visit on 04/10/20 (from the past 24 hour(s))  POCT glycosylated hemoglobin (Hb A1C)     Status: Abnormal   Collection Time: 04/10/20  9:05 AM  Result Value Ref Range   Hemoglobin A1C 6.3 (A) 4.0 - 5.6 %   HbA1c POC (<> result, manual entry)     HbA1c, POC (prediabetic range)     HbA1c, POC (controlled diabetic range)       ASSESSMENT/PLAN:   Prediabetes A1c today still in prediabetic range at 6.3.  Patient very happy about this.  We will continue with her current regimen of Metformin 500 mg daily.  She is up-to-date on lipid panel.  We will repeat CMP per below.  Patient would like to follow-up in 3 months again for another A1c as she likes to get these every 3 months.  Elevated alkaline phosphatase level Alk phos previously mildly elevated with GGT normal.  We will follow-up with a CMP today to ensure that there is no increase.  If increasing, could consider  vitamin D check as well as low levels of vitamin D can cause elevated alk phos.  Hypokalemia Potassium slightly low on last check at 3.4.  We will follow up on CMP today.     Cleophas Dunker, [place]

## 2020-04-11 ENCOUNTER — Encounter: Payer: Self-pay | Admitting: Family Medicine

## 2020-04-11 LAB — COMPREHENSIVE METABOLIC PANEL
ALT: 26 IU/L (ref 0–32)
AST: 32 IU/L (ref 0–40)
Albumin/Globulin Ratio: 1.5 (ref 1.2–2.2)
Albumin: 4.5 g/dL (ref 3.7–4.7)
Alkaline Phosphatase: 161 IU/L — ABNORMAL HIGH (ref 44–121)
BUN/Creatinine Ratio: 21 (ref 12–28)
BUN: 17 mg/dL (ref 8–27)
Bilirubin Total: 0.8 mg/dL (ref 0.0–1.2)
CO2: 28 mmol/L (ref 20–29)
Calcium: 10.6 mg/dL — ABNORMAL HIGH (ref 8.7–10.3)
Chloride: 98 mmol/L (ref 96–106)
Creatinine, Ser: 0.82 mg/dL (ref 0.57–1.00)
GFR calc Af Amer: 78 mL/min/{1.73_m2} (ref >59)
GFR calc non Af Amer: 68 mL/min/{1.73_m2} (ref >59)
Globulin, Total: 3.1 g/dL (ref 1.5–4.5)
Glucose: 136 mg/dL — ABNORMAL HIGH (ref 65–99)
Potassium: 3.4 mmol/L — ABNORMAL LOW (ref 3.5–5.2)
Sodium: 140 mmol/L (ref 134–144)
Total Protein: 7.6 g/dL (ref 6.0–8.5)

## 2020-04-11 NOTE — Progress Notes (Signed)
Letter sent.

## 2020-04-16 DIAGNOSIS — H40013 Open angle with borderline findings, low risk, bilateral: Secondary | ICD-10-CM | POA: Diagnosis not present

## 2020-04-16 DIAGNOSIS — Z961 Presence of intraocular lens: Secondary | ICD-10-CM | POA: Diagnosis not present

## 2020-04-16 DIAGNOSIS — E119 Type 2 diabetes mellitus without complications: Secondary | ICD-10-CM | POA: Diagnosis not present

## 2020-04-16 LAB — HM DIABETES EYE EXAM

## 2020-05-20 ENCOUNTER — Ambulatory Visit (INDEPENDENT_AMBULATORY_CARE_PROVIDER_SITE_OTHER): Payer: Medicare Other

## 2020-05-20 DIAGNOSIS — Z0000 Encounter for general adult medical examination without abnormal findings: Secondary | ICD-10-CM | POA: Diagnosis not present

## 2020-05-20 NOTE — Patient Instructions (Signed)
You spoke to Susan Ingram, Susan Ingram over the phone for your annual wellness visit.  We discussed goals: Goals    . Exercise 3x per week (15 minutes per time)     Short walks outside with the nicer weather ~15 minutes      We also discussed recommended health maintenance. As discussed, you are up to date with everything!   Health Maintenance  Topic Date Due  . INFLUENZA VACCINE  09/15/2020  . TETANUS/TDAP  03/25/2027  . DEXA SCAN  Completed  . COVID-19 Vaccine  Completed  . PNA vac Low Risk Adult  Completed  . HPV VACCINES  Aged Out  . URINE MICROALBUMIN  Discontinued   Fill out an advanced directive. You are due for 2nd Booster.  PCP apt scheduled for 5/25 @ 930am.  Health Maintenance, Female Adopting a healthy lifestyle and getting preventive care are important in promoting health and wellness. Ask your health care provider about:  The right schedule for you to have regular tests and exams.  Things you can do on your own to prevent diseases and keep yourself healthy. What should I know about diet, weight, and exercise? Eat a healthy diet  Eat a diet that includes plenty of vegetables, fruits, low-fat dairy products, and lean protein.  Do not eat a lot of foods that are high in solid fats, added sugars, or sodium.   Maintain a healthy weight Body mass index (BMI) is used to identify weight problems. It estimates body fat based on height and weight. Your health care provider can help determine your BMI and help you achieve or maintain a healthy weight. Get regular exercise Get regular exercise. This is one of the most important things you can do for your health. Most adults should:  Exercise for at least 150 minutes each week. The exercise should increase your heart rate and make you sweat (moderate-intensity exercise).  Do strengthening exercises at least twice a week. This is in addition to the moderate-intensity exercise.  Spend less time sitting. Even light physical  activity can be beneficial. Watch cholesterol and blood lipids Have your blood tested for lipids and cholesterol at 81 years of age, then have this test every 5 years. Have your cholesterol levels checked more often if:  Your lipid or cholesterol levels are high.  You are older than 81 years of age.  You are at high risk for heart disease. What should I know about cancer screening? Depending on your health history and family history, you may need to have cancer screening at various ages. This may include screening for:  Breast cancer.  Cervical cancer.  Colorectal cancer.  Skin cancer.  Lung cancer. What should I know about heart disease, diabetes, and high blood pressure? Blood pressure and heart disease  High blood pressure causes heart disease and increases the risk of stroke. This is more likely to develop in people who have high blood pressure readings, are of African descent, or are overweight.  Have your blood pressure checked: ? Every 3-5 years if you are 66-31 years of age. ? Every year if you are 72 years old or older. Diabetes Have regular diabetes screenings. This checks your fasting blood sugar level. Have the screening done:  Once every three years after age 53 if you are at a normal weight and have a low risk for diabetes.  More often and at a younger age if you are overweight or have a high risk for diabetes. What should I know  about preventing infection? Hepatitis B If you have a higher risk for hepatitis B, you should be screened for this virus. Talk with your health care provider to find out if you are at risk for hepatitis B infection. Hepatitis C Testing is recommended for:  Everyone born from [birth year] through 1966.  Anyone with known risk factors for hepatitis C. Sexually transmitted infections (STIs)  Get screened for STIs, including gonorrhea and chlamydia, if: ? You are sexually active and are younger than 81 years of age. ? You are older than 81  years of age and your health care provider tells you that you are at risk for this type of infection. ? Your sexual activity has changed since you were last screened, and you are at increased risk for chlamydia or gonorrhea. Ask your health care provider if you are at risk.  Ask your health care provider about whether you are at high risk for HIV. Your health care provider may recommend a prescription medicine to help prevent HIV infection. If you choose to take medicine to prevent HIV, you should first get tested for HIV. You should then be tested every 3 months for as long as you are taking the medicine. Pregnancy  If you are about to stop having your period (premenopausal) and you may become pregnant, seek counseling before you get pregnant.  Take 400 to 800 micrograms (mcg) of folic acid every day if you become pregnant.  Ask for birth control (contraception) if you want to prevent pregnancy. Osteoporosis and menopause Osteoporosis is a disease in which the bones lose minerals and strength with aging. This can result in bone fractures. If you are 77 years old or older, or if you are at risk for osteoporosis and fractures, ask your health care provider if you should:  Be screened for bone loss.  Take a calcium or vitamin D supplement to lower your risk of fractures.  Be given hormone replacement therapy (HRT) to treat symptoms of menopause. Follow these instructions at home: Lifestyle  Do not use any products that contain nicotine or tobacco, such as cigarettes, e-cigarettes, and chewing tobacco. If you need help quitting, ask your health care provider.  Do not use street drugs.  Do not share needles.  Ask your health care provider for help if you need support or information about quitting drugs. Alcohol use  Do not drink alcohol if: ? Your health care provider tells you not to drink. ? You are pregnant, may be pregnant, or are planning to become pregnant.  If you drink  alcohol: ? Limit how much you use to 0-1 drink a day. ? Limit intake if you are breastfeeding.  Be aware of how much alcohol is in your drink. In the [place] one drink equals one 12 oz bottle of beer (355 mL), one 5 oz glass of wine (148 mL), or one 1 oz glass of hard liquor (44 mL). General instructions  Schedule regular health, dental, and eye exams.  Stay current with your vaccines.  Tell your health care provider if: ? You often feel depressed. ? You have ever been abused or do not feel safe at home. Summary  Adopting a healthy lifestyle and getting preventive care are important in promoting health and wellness.  Follow your health care provider's instructions about healthy diet, exercising, and getting tested or screened for diseases.  Follow your health care provider's instructions on monitoring your cholesterol and blood pressure. This information is not intended to replace advice given to  you by your health care provider. Make sure you discuss any questions you have with your health care provider. Document Revised: 01/25/2018 Document Reviewed: 01/25/2018 Elsevier Patient Education  2021 [employer].   Our clinic's number is [phone]. Please call with questions or concerns about what we discussed today.

## 2020-05-20 NOTE — Progress Notes (Addendum)
Subjective:   Susan Ingram is a 80 y.o. female who presents for Medicare Annual (Subsequent) preventive examination.  Patient consented to have virtual visit and was identified by name and date of birth. Method of visit: Telephone  Encounter participants: Patient: Susan Ingram - located at Home Nurse/Provider: Dorna Bloom - located at [facility] Others (if applicable): NA  Review of Systems: Defer to PCP  Cardiac Risk Factors include: advanced age (>40men, >4 women)  Objective:   Vitals: There were no vitals taken for this visit.  There is no height or weight on file to calculate BMI.  Advanced Directives 05/20/2020 02/19/2020 12/24/2019 12/12/2019 04/09/2019 01/09/2019 10/25/2018  Does Patient Have a Medical Advance Directive? No No No No No No No  Would patient like information on creating a medical advance directive? Yes (MAU/Ambulatory/Procedural Areas - Information given) No - Patient declined No - Patient declined No - Patient declined No - Patient declined No - Patient declined No - Patient declined   Tobacco Social History   Tobacco Use  Smoking Status Former Smoker  . Quit date: 04/15/1993  . Years since quitting: 27.1  Smokeless Tobacco Never Used     Counseling given: No plans to restart  Clinical Intake:  Pre-visit preparation completed: Yes  Pain Score: 0-No pain  How often do you need to have someone help you when you read instructions, pamphlets, or other written materials from your doctor or pharmacy?: 2 - Rarely What is the last grade level you completed in school?: High School  Past Medical History:  Diagnosis Date  . Arthralgia of right temporomandibular joint 10/10/2012  . Arthritis    RIGHT KNEE  . Asthma   . Cancer ([place])    lung  . Cervical stenosis of spinal canal 09/03/2015   MRI 08/2015: Degenerative changes resulting in moderate canal stenosis C3-4, mild to moderate at C4-5 and C5-6.  Neural foraminal narrowing C3-4 thru C6-7,  moderate at multiple levels.  . Diabetes ([place]) 02/28/2018  . Dizzy spells 08/11/2013  . Eczema   . Eustachian tube dysfunction 07/23/2016  . Heartburn 06/18/2014  . Helicobacter pylori gastritis 10/08/2014  . History of lung cancer 11/24/2010   S/P Bronchoscopy, left VATS, wedge resection,lingular segmentectomy, lymph node dissection 05/23/2008, [place]. Recently seen by [facility] on 10/02/13.  Negative CT scan. Patient now cured.   Marland Kitchen History of lung cancer 11/24/2010   S/P Bronchoscopy, left VATS, wedge resection,lingular segmentectomy, lymph node dissection 05/23/2008, [place]. Recently seen by [facility] on 10/02/13.  Negative CT scan. Patient now cured.   Marland Kitchen History of vitamin D deficiency 02/18/2012  . Hypercholesterolemia   . Hypertension   . Hypertension associated with diabetes ([place])   . Otalgia of both ears 01/03/2015  . 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 Seborrheic keratosis 09/03/2015  . Throat clearing 09/15/2013   Past Surgical History:  Procedure Laterality Date  . Bronchoscopy, left video-assisted thoracoscopy, wedge  05/23/2008  . Left thyroid lobectomy, frozen section.  10/11/2002  . VAGINAL HYSTERECTOMY  1966   in her 52's    Family History  Problem Relation Age of Onset  . Alzheimer's disease Father   . Early death Mother   . Hypertension Mother   . Stroke Mother   . Diabetes Brother   . Breast cancer Maternal Uncle   . Breast cancer Cousin    Social History   Socioeconomic History  . Marital status: Divorced    Spouse name: Not on file  . Number of children:  1  . Years of education: 64  . Highest education level: 12th grade  Occupational History  . Occupation: Retired  Tobacco Use  . Smoking status: Former Smoker    Quit date: 04/15/1993    Years since quitting: 27.1  . Smokeless tobacco: Never Used  Vaping Use  . Vaping Use: Never used  Substance and Sexual Activity  . Alcohol use: No  . Drug use: No  . Sexual activity: Not Currently  Other Topics  Concern  . Not on file  Social History Narrative   Patient lives alone in [place].   Patient has one daughter and 7 grandchildren.    Patient typically rides the bus for transportation, however her grandchildren will help her get around as needed.   Patient enjoys watching westerns on TV, reading the paper and reading books.    Patient does not have an active work out plan, however she stays "on the move all the time."    Social Determinants of Health   Financial Resource Strain: Low Risk   . Difficulty of Paying Living Expenses: Not hard at all  Food Insecurity: No Food Insecurity  . Worried About Charity fundraiser in the Last Year: Never true  . Ran Out of Food in the Last Year: Never true  Transportation Needs: No Transportation Needs  . Lack of Transportation (Medical): No  . Lack of Transportation (Non-Medical): No  Physical Activity: Inactive  . Days of Exercise per Week: 0 days  . Minutes of Exercise per Session: 0 min  Stress: Stress Concern Present  . Feeling of Stress : To some extent  Social Connections: Socially Isolated  . Frequency of Communication with Friends and Family: More than three times a week  . Frequency of Social Gatherings with Friends and Family: More than three times a week  . Attends Religious Services: Never  . Active Member of Clubs or Organizations: No  . Attends Archivist Meetings: Never  . Marital Status: Divorced   Outpatient Encounter Medications as of 05/20/2020  Medication Sig  . albuterol (PROAIR HFA) 108 (90 Base) MCG/ACT inhaler Inhale 2 puffs into the lungs every 6 (six) hours as needed for wheezing or shortness of breath.  Marland Kitchen atorvastatin (LIPITOR) 40 MG tablet Take 1 tablet by mouth once daily  . chlorthalidone (HYGROTON) 25 MG tablet Take 1 tablet by mouth once daily  . EQ ALLERGY RELIEF, CETIRIZINE, 10 MG tablet Take 1/2 (one-half) tablet by mouth once daily  . fluticasone (FLONASE) 50 MCG/ACT nasal spray Use 2 spray(s)  in each nostril once daily  . metFORMIN (GLUCOPHAGE) 500 MG tablet Take 1 tablet by mouth once daily with breakfast  . hydrocortisone 2.5 % ointment Apply topically 2 (two) times daily. (Patient not taking: Reported on 05/20/2020)  . omeprazole (PRILOSEC) 20 MG capsule Take 1 capsule (20 mg total) by mouth daily. (Patient not taking: Reported on 05/20/2020)  . [DISCONTINUED] potassium chloride (K-DUR) 10 MEQ tablet Take 1 tablet (10 mEq total) by mouth daily.   No facility-administered encounter medications on file as of 05/20/2020.   Activities of Daily Living In your present state of health, do you have any difficulty performing the following activities: 05/20/2020  Hearing? N  Vision? N  Difficulty concentrating or making decisions? N  Walking or climbing stairs? N  Dressing or bathing? N  Doing errands, shopping? N  Preparing Food and eating ? N  Using the Toilet? N  In the past six months, have you accidently  leaked urine? N  Do you have problems with loss of bowel control? N  Managing your Medications? N  Managing your Finances? N  Housekeeping or managing your Housekeeping? N  Some recent data might be hidden   Patient Care Team: Wilber Oliphant, MD as PCP - General [place]rose Nakayama, MD (Cardiothoracic Surgery) Marijean Bravo, MD (Urgent Care)    Assessment:   This is a routine wellness examination for Susan Ingram.  Exercise Activities and Dietary recommendations Current Exercise Habits: The patient does not participate in regular exercise at present, Exercise limited by: None identified  Goals    . Exercise 3x per week (15 minutes per time)     Short walks outside with the nicer weather ~15 minutes      Fall Risk Fall Risk  05/20/2020 12/24/2019 09/04/2019 04/09/2019 03/16/2019  Falls in the past year? 0 0 0 0 0  Number falls in past yr: - 0 0 - 0  Injury with Fall? - 0 - - -  Follow up Falls prevention discussed - Falls evaluation completed - Falls evaluation completed   Is  the patient's home free of loose throw rugs in walkways, pet beds, electrical cords, etc?   yes      Grab bars in the bathroom? yes      Handrails on the stairs?   yes      Adequate lighting?   yes  Patient rating of health (0-10) scale: 8  Depression Screen PHQ 2/9 Scores 05/20/2020 04/10/2020 02/19/2020 12/24/2019  PHQ - 2 Score 2 2 6  0  PHQ- 9 Score 7 7 17 4     Cognitive Function  Montreal Cognitive Assessment  04/09/2019  Visuospatial/ Executive (0/5) 1  Naming (0/3) 3  Attention: Read list of digits (0/2) 2  Attention: Read list of letters (0/1) 1  Attention: Serial 7 subtraction starting at 100 (0/3) 0  Language: Repeat phrase (0/2) 0  Language : Fluency (0/1) 0  Abstraction (0/2) 0  Delayed Recall (0/5) 0  Orientation (0/6) 6  Total 13  Adjusted Score (based on education) 14   6CIT Screen 05/20/2020  What Year? 0 points  What month? 0 points  What time? 0 points  Count back from 20 4 points  Months in reverse 2 points  Repeat phrase 0 points  Total Score 6   Immunization History  Administered Date(s) Administered  . Fluad Quad(high Dose 65+) 12/24/2019  . Influenza Split 02/18/2012  . Influenza,inj,Quad PF,6+ Mos 12/27/2012, 12/12/2013, 12/09/2014, 12/01/2015, 11/16/2016, 11/10/2017, 10/25/2018  . Moderna Sars-Covid-2 Vaccination 03/31/2019, 04/28/2019, 01/15/2020  . Pneumococcal Conjugate-13 01/02/2014  . Pneumococcal Polysaccharide-23 07/15/2016  . Tdap 03/24/2017  . Zoster Recombinat (Shingrix) 06/13/2017, 08/11/2017   Screening Tests Health Maintenance  Topic Date Due  . INFLUENZA VACCINE  09/15/2020  . TETANUS/TDAP  03/25/2027  . DEXA SCAN  Completed  . COVID-19 Vaccine  Completed  . PNA vac Low Risk Adult  Completed  . HPV VACCINES  Aged Out  . URINE MICROALBUMIN  Discontinued   Cancer Screenings: Lung: Low Dose CT Chest recommended if Age 28-80 years, 30 pack-year currently smoking OR have quit w/in 15years. Patient does not qualify. Breast:  Up to  date on Mammogram? Yes   Up to date of Bone Density/Dexa? Yes Colorectal: 2016  Additional Screenings: HIV Screening: Completed  Pap Smear: 06/2018 NIL   Plan:  Venida Jarvis out an advanced directive. You are due for 2nd Booster.  PCP apt scheduled for 5/25 @ 930am.  I  have personally reviewed and noted the following in the patient's chart:   . Medical and social history . Use of alcohol, tobacco or illicit drugs  . Current medications and supplements . Functional ability and status . Nutritional status . Physical activity . Advanced directives . List of other physicians . Hospitalizations, surgeries, and ER visits in previous 12 months . Vitals . Screenings to include cognitive, depression, and falls . Referrals and appointments  In addition, I have reviewed and discussed with patient certain preventive protocols, quality metrics, and best practice recommendations. A written personalized care plan for preventive services as well as general preventive health recommendations were provided to patient.  This visit was conducted virtually in the setting of the [place] pandemic.    Dorna Bloom, [place]  05/20/2020     I have reviewed this visit and agree with the documentation.  Wilber Oliphant, M.D.  11:59 AM 05/20/2020

## 2020-06-03 ENCOUNTER — Other Ambulatory Visit: Payer: Self-pay | Admitting: Family Medicine

## 2020-06-03 DIAGNOSIS — Z1231 Encounter for screening mammogram for malignant neoplasm of breast: Secondary | ICD-10-CM

## 2020-06-05 ENCOUNTER — Other Ambulatory Visit: Payer: Self-pay | Admitting: *Deleted

## 2020-06-05 MED ORDER — METFORMIN HCL 500 MG PO TABS
1.0000 | ORAL_TABLET | Freq: Every day | ORAL | 3 refills | Status: DC
Start: 2020-06-05 — End: 2020-09-19

## 2020-06-27 ENCOUNTER — Other Ambulatory Visit: Payer: Self-pay | Admitting: *Deleted

## 2020-06-27 ENCOUNTER — Other Ambulatory Visit: Payer: Self-pay | Admitting: Family Medicine

## 2020-06-27 DIAGNOSIS — I10 Essential (primary) hypertension: Secondary | ICD-10-CM

## 2020-06-28 MED ORDER — CHLORTHALIDONE 25 MG PO TABS: 25.0000 mg | ORAL_TABLET | Freq: Every day | ORAL | 0 refills | Status: DC

## 2020-07-09 ENCOUNTER — Other Ambulatory Visit: Payer: Self-pay

## 2020-07-09 ENCOUNTER — Encounter: Payer: Self-pay | Admitting: Family Medicine

## 2020-07-09 ENCOUNTER — Ambulatory Visit (INDEPENDENT_AMBULATORY_CARE_PROVIDER_SITE_OTHER): Payer: Medicare Other | Admitting: Family Medicine

## 2020-07-09 VITALS — BP 136/68 | HR 60 | Wt 174.6 lb

## 2020-07-09 DIAGNOSIS — I10 Essential (primary) hypertension: Secondary | ICD-10-CM

## 2020-07-09 DIAGNOSIS — R7303 Prediabetes: Secondary | ICD-10-CM

## 2020-07-09 DIAGNOSIS — J309 Allergic rhinitis, unspecified: Secondary | ICD-10-CM

## 2020-07-09 LAB — POCT GLYCOSYLATED HEMOGLOBIN (HGB A1C): HbA1c, POC (controlled diabetic range): 6.8 % (ref 0.0–7.0)

## 2020-07-09 MED ORDER — CETIRIZINE HCL 10 MG PO TABS: ORAL_TABLET | ORAL | 0 refills | Status: DC

## 2020-07-09 NOTE — Patient Instructions (Signed)
Call us to make the geriatric appointment for another cognitive exam

## 2020-07-09 NOTE — Progress Notes (Signed)
    SUBJECTIVE:   CHIEF COMPLAINT / HPI:   Prediabetes  Would like to get hemoglobin a1c today. Continues to take metformin.  Denies any medication side effects  Annual medicare visit follow up  Patient recently had nurse come to her house.  She was told to bring her follow-up paper to her physician.  Today, discussed fourth COVID booster, which patient declines at this time.  Also reviewed patient's mini cog drawing.  We previously have done a [place] with abnormal results in the past.  I have encouraged her to follow-up in geriatric clinic which she has declined in the past.  We discussed this again today and she reports that she will call to make an appointment when she has time.  She continues to be busy taking care of her daughter who is battling cervical cancer at this time.  Patient is following up with dermatology and brought in the letter as she had a question about possibly setting up for MyChart.  She is not interested in signing up for my chart at this time.  Hypertension Patient currently taking chlorthalidone.  She does not check her blood pressures at home.  On recheck, her blood pressure is 136/68 today.  She does not have any chest pain, shortness of breath, lower extremity edema, medication side effects.  PERTINENT  PMH / PSH: Hypertension, asthma, hyperlipidemia, GERD, prediabetes  OBJECTIVE:   BP 136/68   Pulse 60   Wt 174 lb 9.6 oz (79.2 kg)   SpO2 98%   BMI 30.93 kg/m   General: Well-appearing female, no acute distress Lungs: No respiratory distress, speaking full sentences MSK: Moving extremities spontaneously.  No lower extremity edema.  ASSESSMENT/PLAN:  Abnormal mini cog  Mild cognitive impairment Previously abnormal MoCA when administered for patient's complaint of short-term memory issues.  Patient lives alone and performs her ADLs and IADLs without issue.  We have discussed this in the past and have encouraged patient to follow-up in geriatric clinic.   Today, she is agreeable and will call to make an appointment.  Prediabetes Recheck A1c Continue metformin  Hypertension On recheck, blood pressure 136/68 today. Continue chlorthalidone.   Hypercalcemia   Mildly elevated in February 2022 and previously elevated in 2021 once. Patient is on chlorthalidone. Needs recheck. Will call patient to return for labs.  - Future CMP placed (elevated ALP as well)    Wilber Oliphant, MD [place]

## 2020-07-10 NOTE — Progress Notes (Signed)
Spoke to patient over the phone about elevated calcium and alk phos and need for follow up labs. She will call next week to make an appointment. Future orders placed.

## 2020-07-10 NOTE — Addendum Note (Signed)
Addended by: Wilber Oliphant on: 07/10/2020 07:44 PM   Modules accepted: Orders

## 2020-07-11 ENCOUNTER — Other Ambulatory Visit: Payer: Medicare Other

## 2020-07-11 ENCOUNTER — Other Ambulatory Visit: Payer: Self-pay

## 2020-07-11 DIAGNOSIS — I10 Essential (primary) hypertension: Secondary | ICD-10-CM

## 2020-07-16 ENCOUNTER — Other Ambulatory Visit: Payer: Self-pay

## 2020-07-16 ENCOUNTER — Ambulatory Visit (INDEPENDENT_AMBULATORY_CARE_PROVIDER_SITE_OTHER): Payer: Medicare Other | Admitting: Physician Assistant

## 2020-07-16 ENCOUNTER — Encounter: Payer: Self-pay | Admitting: Physician Assistant

## 2020-07-16 DIAGNOSIS — L821 Other seborrheic keratosis: Secondary | ICD-10-CM

## 2020-07-16 DIAGNOSIS — L700 Acne vulgaris: Secondary | ICD-10-CM | POA: Diagnosis not present

## 2020-07-16 NOTE — Progress Notes (Signed)
   New Patient   Subjective  Susan Ingram is a 80 y.o. female who presents for the following: Annual Exam (Right pinky wart x 30 years ln2 topical otc, right inner lower leg darker and larger lesion x1 year).   The following portions of the chart were reviewed this encounter and updated as appropriate:      Objective  Well appearing patient in no apparent distress; mood and affect are within normal limits.  A focused examination was performed including face, neck, chest, back and legs. Relevant physical exam findings are noted in the Assessment and Plan.  Objective  Right Lower Leg - Anterior: Stuck-on, dark bichromic plaque.   Objective  Right Distal Thumb: Crust with an opening. No keratin plug or signs of infection.   Assessment & Plan  Seborrheic keratosis Right Lower Leg - Anterior  observe  Open comedone Right Distal Thumb  observe    I, Kennon Encinas, PA-C, have reviewed all documentation for this visit. The documentation on 07/16/20 for the exam, diagnosis, procedures, and orders are all accurate and complete.

## 2020-07-19 LAB — COMPREHENSIVE METABOLIC PANEL
ALT: 26 IU/L (ref 0–32)
AST: 30 IU/L (ref 0–40)
Albumin/Globulin Ratio: 1.4 (ref 1.2–2.2)
Albumin: 4.4 g/dL (ref 3.6–4.6)
Alkaline Phosphatase: 157 IU/L — ABNORMAL HIGH (ref 44–121)
BUN/Creatinine Ratio: 14 (ref 12–28)
BUN: 12 mg/dL (ref 8–27)
Bilirubin Total: 1.2 mg/dL (ref 0.0–1.2)
CO2: 25 mmol/L (ref 20–29)
Calcium: 10.2 mg/dL (ref 8.7–10.3)
Chloride: 93 mmol/L — ABNORMAL LOW (ref 96–106)
Creatinine, Ser: 0.85 mg/dL (ref 0.57–1.00)
Globulin, Total: 3.1 g/dL (ref 1.5–4.5)
Glucose: 114 mg/dL — ABNORMAL HIGH (ref 65–99)
Potassium: 3.7 mmol/L (ref 3.5–5.2)
Sodium: 137 mmol/L (ref 134–144)
Total Protein: 7.5 g/dL (ref 6.0–8.5)
eGFR: 69 mL/min/{1.73_m2} (ref >59)

## 2020-07-19 LAB — PTH, INTACT AND CALCIUM: PTH: 29 pg/mL (ref 15–65)

## 2020-07-19 LAB — ALKALINE PHOSPHATASE, ISOENZYMES
BONE FRACTION: 21 % (ref 14–68)
INTESTINAL FRAC.: 0 % (ref 0–18)
LIVER FRACTION: 79 % (ref 18–85)

## 2020-07-21 ENCOUNTER — Telehealth: Payer: Self-pay

## 2020-07-21 NOTE — Telephone Encounter (Signed)
Patient calls nurse line requesting to go over results from visit on 5/25.  Please advise.   Talbot Grumbling, RN

## 2020-07-23 NOTE — Telephone Encounter (Signed)
Called patient back to review results.

## 2020-07-24 ENCOUNTER — Ambulatory Visit
Admission: RE | Admit: 2020-07-24 | Discharge: 2020-07-24 | Disposition: A | Discharge status: Home / Self Care | Payer: Medicare Other | Source: Ambulatory Visit | Attending: Family Medicine | Admitting: Family Medicine

## 2020-07-24 ENCOUNTER — Ambulatory Visit: Payer: Medicare Other

## 2020-07-24 ENCOUNTER — Other Ambulatory Visit: Payer: Self-pay

## 2020-07-24 DIAGNOSIS — Z1231 Encounter for screening mammogram for malignant neoplasm of breast: Secondary | ICD-10-CM

## 2020-08-06 ENCOUNTER — Other Ambulatory Visit: Payer: Self-pay | Admitting: Family Medicine

## 2020-08-06 DIAGNOSIS — E118 Type 2 diabetes mellitus with unspecified complications: Secondary | ICD-10-CM

## 2020-08-21 ENCOUNTER — Other Ambulatory Visit: Payer: Self-pay

## 2020-08-21 ENCOUNTER — Encounter: Payer: Self-pay | Admitting: Family Medicine

## 2020-08-21 ENCOUNTER — Ambulatory Visit (INDEPENDENT_AMBULATORY_CARE_PROVIDER_SITE_OTHER): Payer: Medicare Other | Admitting: Family Medicine

## 2020-08-21 VITALS — BP 167/86 | HR 105

## 2020-08-21 DIAGNOSIS — R059 Cough, unspecified: Secondary | ICD-10-CM | POA: Diagnosis not present

## 2020-08-21 MED ORDER — BENZONATATE 100 MG PO CAPS: 100.0000 mg | ORAL_CAPSULE | Freq: Two times a day (BID) | ORAL | 0 refills | Status: DC | PRN

## 2020-08-21 NOTE — Progress Notes (Signed)
    SUBJECTIVE:   CHIEF COMPLAINT / HPI:   Intermittent cough almost 1 week. Has cough. Uses her inhaler when needed maybe 1-2x week. Ususally gets an asthma treatment once yearly about 4 years ago. Allergies are bothering her with nasal congestion takes cetrizine 5 mg daily. Otherwise she endorses feeling well and doing her daily chores around the house. Her daughter would like for her to be test for COVID.    PERTINENT  PMH / PSH: HTN  OBJECTIVE:   BP (!) 167/86   Pulse (!) 105   SpO2 99%   General: Appears well, no acute distress. Age appropriate. Cardiac: RRR, normal heart sounds, no murmurs Respiratory: CTAB, normal effort  ASSESSMENT/PLAN:   Cough No systemic symptoms or concerning exam findings. Patient does not appear to be ill. Would something for cough and COVID testing per daughter. Discussed return precaution if course changes and continuation of cetrizine for allergic rhinitis.  - benzonatate (TESSALON) 100 MG capsule; Take 1 capsule (100 mg total) by mouth 2 (two) times daily as needed for cough.  Dispense: 20 capsule; Refill: 0 - Novel Coronavirus, NAA ([facility])   Gerlene Fee, DO [place]

## 2020-08-21 NOTE — Patient Instructions (Addendum)
It was wonderful to see you today.  For cough I have given you tessalon perls.   I will notify you of your COVID results.   You can continue your cetrizine for you allergies.   Please be sure to schedule follow up at the front  desk before you leave today.   If you haven't already, sign up for My Chart to have easy access to your labs results, and communication with your primary care physician.  Please call the clinic at [phone] if your symptoms worsen or you have any concerns. It was our pleasure to serve you.  Dr. Janus Molder

## 2020-08-22 LAB — NOVEL CORONAVIRUS, NAA: SARS-CoV-2, NAA: NOT DETECTED

## 2020-08-22 LAB — SARS-COV-2, NAA 2 DAY TAT

## 2020-08-25 ENCOUNTER — Encounter: Payer: Self-pay | Admitting: Family Medicine

## 2020-08-25 ENCOUNTER — Telehealth: Payer: Self-pay | Admitting: Family Medicine

## 2020-08-25 NOTE — Telephone Encounter (Signed)
Attempted to call patient. Busy tone, unable to leave voicemail. COVID negative. Will send letter.  Cuauhtemoc Huegel Autry-Lott, DO 08/25/2020, 12:35 PM PGY-3, [place]

## 2020-08-26 ENCOUNTER — Telehealth: Payer: Self-pay

## 2020-08-26 NOTE — Telephone Encounter (Signed)
Patient calls nurse line requesting covid results. Negative covid result given.

## 2020-08-26 NOTE — Telephone Encounter (Signed)
Mrs. Ehmann contacted the office requesting to be seen with chest xray in the office today. She stated that she had a "spot taken off her lung" back in 2010 by Dr. Roxan Hockey. She stated that she was having the same symptoms as before and she was worried that the spot has returned. Advised patient to contact her PCP, Dr. Jeani Hawking for work-up and if needed she would be referred back here to Dr. Roxan Hockey. She stated that she had already contacted her PCP and that she has an appointment early August.

## 2020-09-05 IMAGING — MG MM DIGITAL DIAGNOSTIC UNILAT*R* W/ TOMO W/ CAD
4 series · 4 of 12 positions shown · non-contrast
Comparison: 07/11/2018 and earlier

CLINICAL DATA: Patient describes diffuse pain in the LATERAL
portion of the RIGHT breast.

EXAM:
DIGITAL DIAGNOSTIC UNILATERAL RIGHT MAMMOGRAM WITH CAD AND TOMO

[R CC synth-2D]
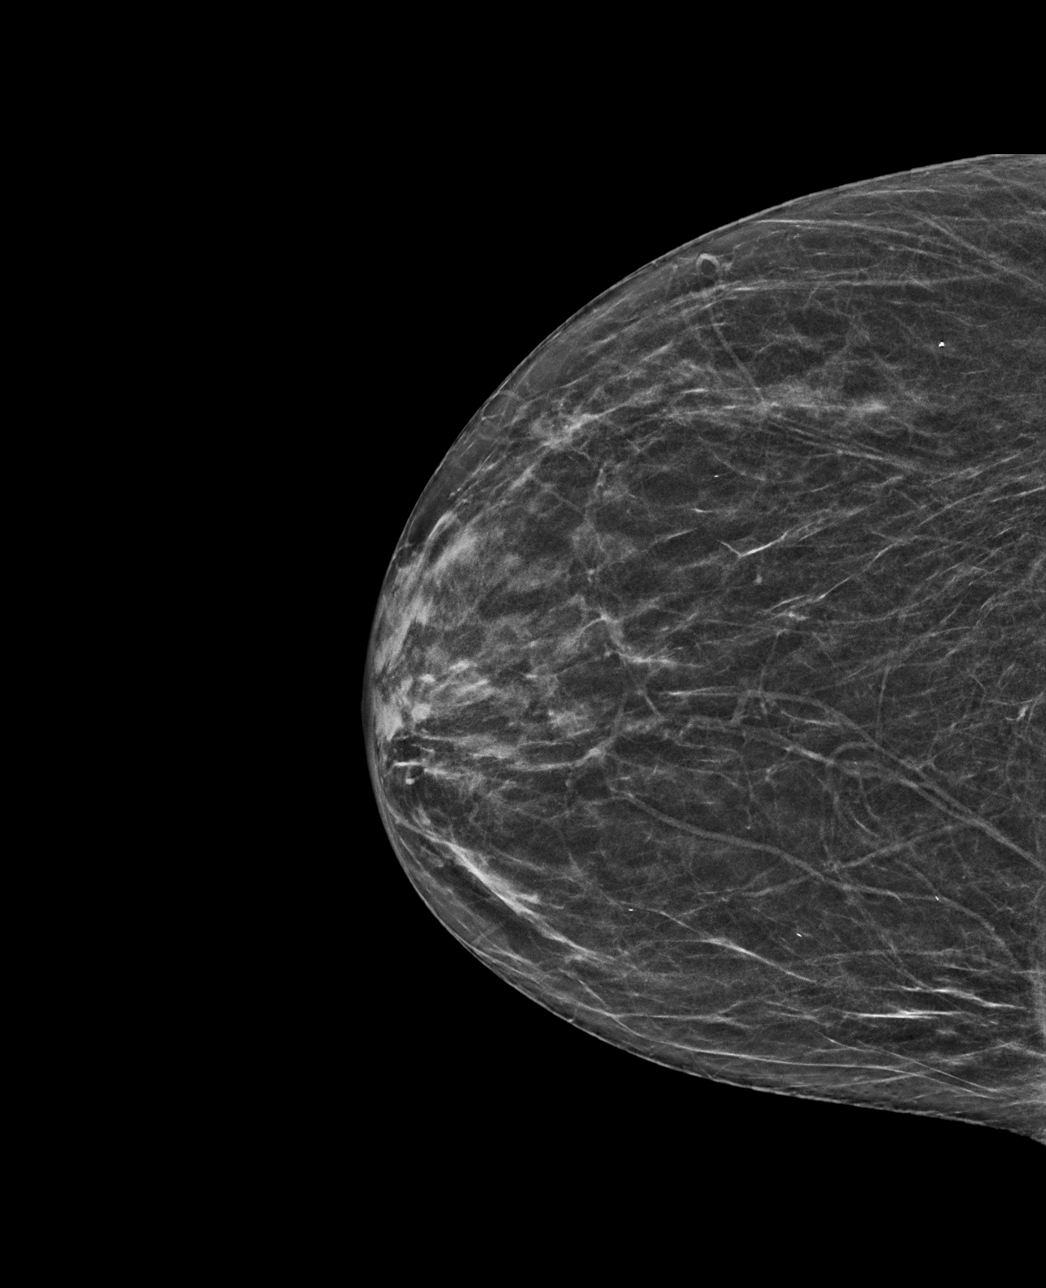

[R MLO synth-2D]
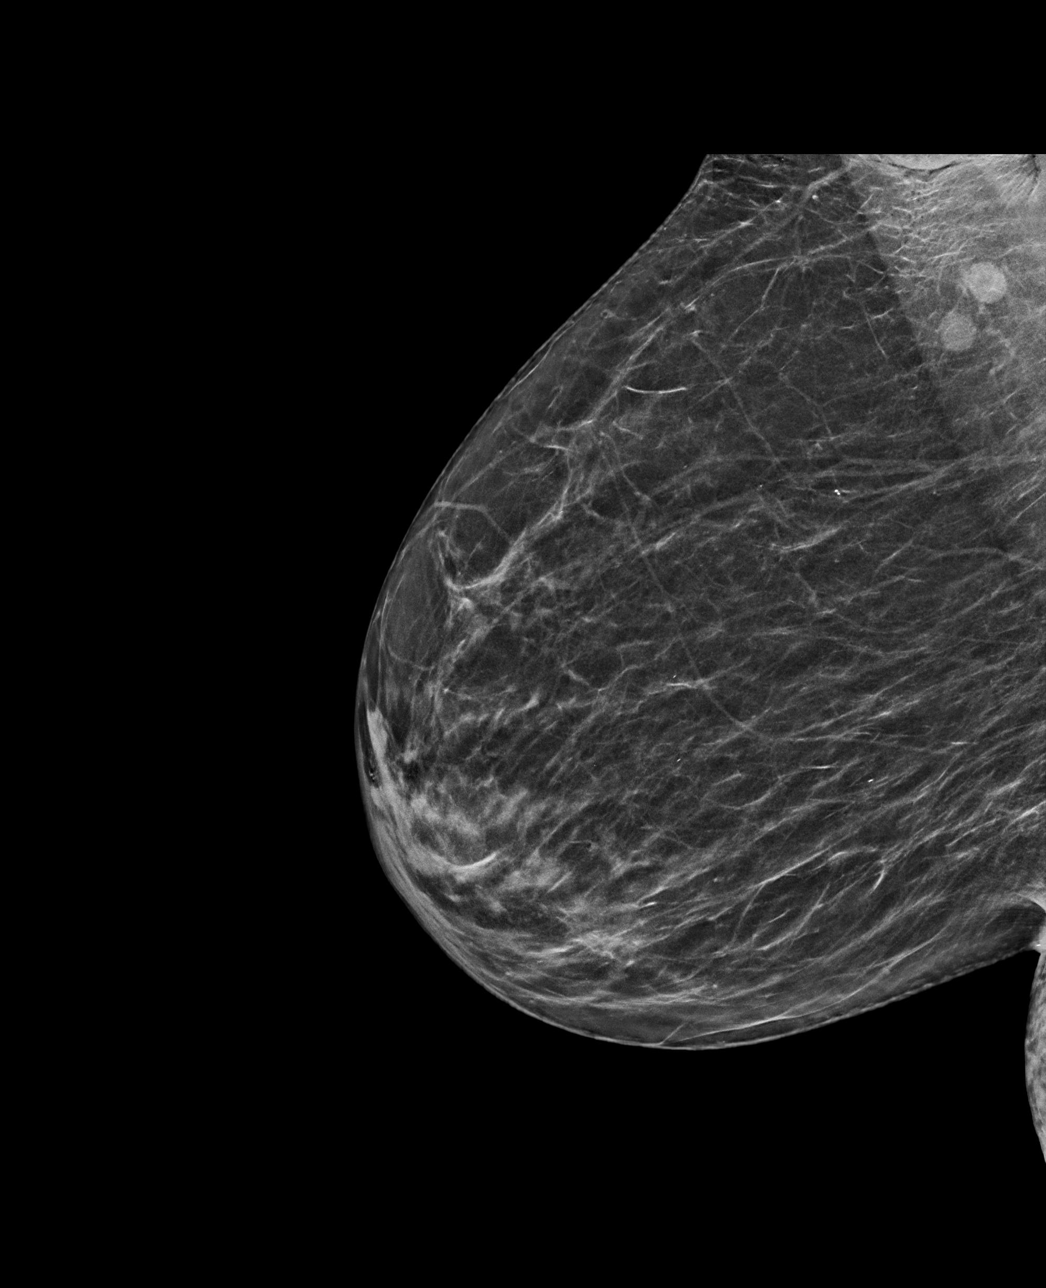

[R CC tomo · tomo slice 27/53.0]
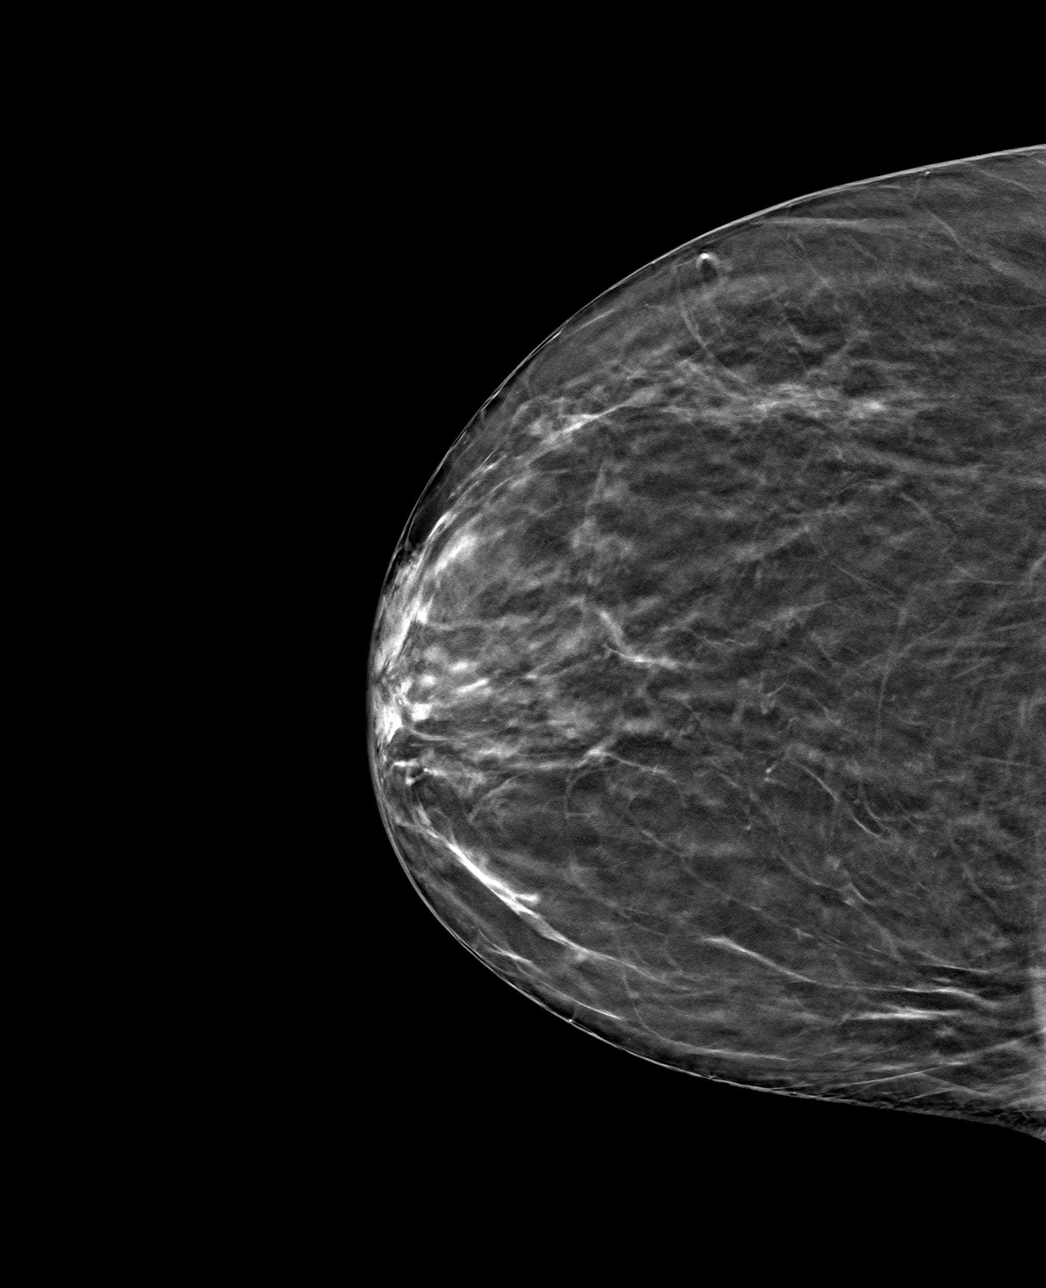

[R MLO tomo · tomo slice 33/64.0]
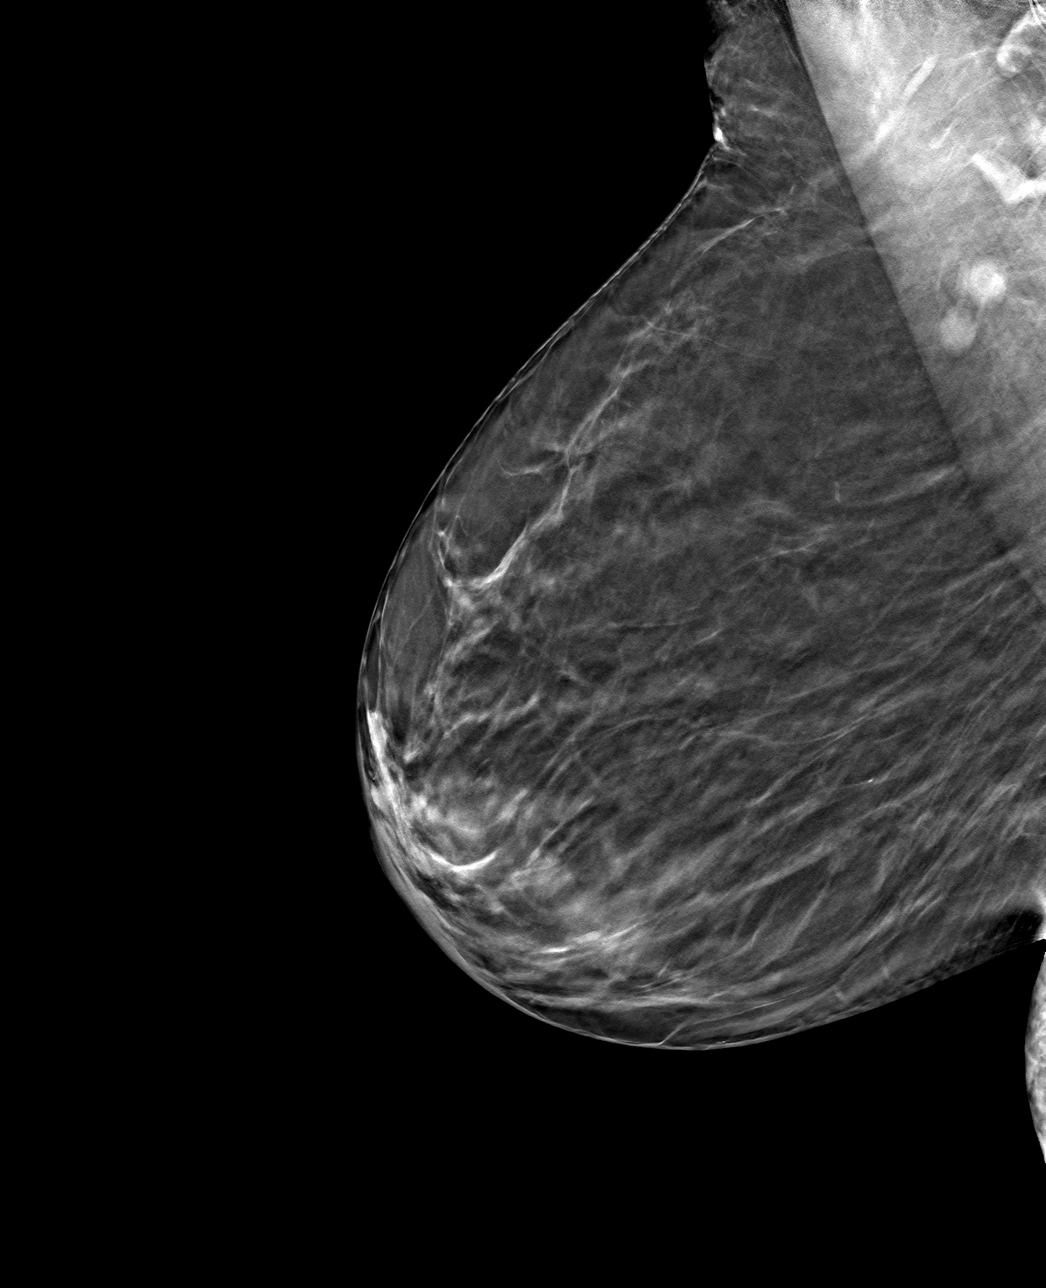

[4 of 12 positions shown; findings below may reference images not displayed]

ACR Breast Density Category b: There are scattered areas of
fibroglandular density.
FINDINGS: No suspicious mass, distortion, or microcalcifications are
identified to suggest presence of malignancy.

Mammographic images were processed with CAD.
IMPRESSION: No mammographic evidence for malignancy.

RECOMMENDATION:
Screening mammogram is recommended in June 2019.

I have discussed the findings and recommendations with the patient.
If applicable, a reminder letter will be sent to the patient
regarding the next appointment.

BI-RADS CATEGORY  1: Negative.

## 2020-09-19 ENCOUNTER — Ambulatory Visit (INDEPENDENT_AMBULATORY_CARE_PROVIDER_SITE_OTHER): Payer: Medicare Other | Admitting: Family Medicine

## 2020-09-19 ENCOUNTER — Other Ambulatory Visit: Payer: Self-pay | Admitting: Family Medicine

## 2020-09-19 ENCOUNTER — Other Ambulatory Visit: Payer: Self-pay

## 2020-09-19 ENCOUNTER — Encounter: Payer: Self-pay | Admitting: Family Medicine

## 2020-09-19 ENCOUNTER — Other Ambulatory Visit: Payer: Self-pay | Admitting: Pediatric Endocrinology

## 2020-09-19 VITALS — BP 156/70 | HR 56 | Ht 63.0 in | Wt 167.6 lb

## 2020-09-19 DIAGNOSIS — E1169 Type 2 diabetes mellitus with other specified complication: Secondary | ICD-10-CM | POA: Diagnosis not present

## 2020-09-19 DIAGNOSIS — R748 Abnormal levels of other serum enzymes: Secondary | ICD-10-CM | POA: Diagnosis not present

## 2020-09-19 DIAGNOSIS — E119 Type 2 diabetes mellitus without complications: Secondary | ICD-10-CM

## 2020-09-19 DIAGNOSIS — R1319 Other dysphagia: Secondary | ICD-10-CM

## 2020-09-19 DIAGNOSIS — R011 Cardiac murmur, unspecified: Secondary | ICD-10-CM | POA: Insufficient documentation

## 2020-09-19 DIAGNOSIS — Z85118 Personal history of other malignant neoplasm of bronchus and lung: Secondary | ICD-10-CM | POA: Diagnosis not present

## 2020-09-19 MED ORDER — EMPAGLIFLOZIN 10 MG PO TABS: 10.0000 mg | ORAL_TABLET | Freq: Every day | ORAL | 1 refills | Status: DC

## 2020-09-19 NOTE — Progress Notes (Signed)
SUBJECTIVE:   CHIEF COMPLAINT / HPI:   Difficulty swallowing - Patient reports several month history of progressive difficulty swallowing - Feels as though food gets stuck in the middle of her throat - Reports almost choking recently - Mainly solids  Elevated alk phos - serially elevated - Last check 07/11/2020 was 157 - Has ranged 126-161 within the last 2 years - normal PTH, Calcium in May 2022 - alkaline phosphatase isoenzymes indicate likely hepatobiliary etiology  Lung imaging, history lung cancer, follow-up - hx lung cancer - h/o wedge resection of lung around 2010 - last images "11 years ago" - patient would like follow up with previous pulm clinic (Dr. Roxan Hockey at [facility]) but was told she needs a referral to go back  Type 2 diabetes -Previously on metformin 500 mg daily - Believes metformin is making her hair fall out - Reports some diarrhea, but it is manageable -Previous A1c's ranged 6.2-6.4 within the last 2 years - Today's A1c 6.8, putting her within the diabetic range  PERTINENT  PMH / PSH: HTN, MDD, HLD, GAD, asthma, allergic rhinitis  OBJECTIVE:   BP (!) 156/70   Pulse (!) 56   Ht '5\' 3"'  (1.6 m)   Wt 167 lb 9.6 oz (76 kg)   SpO2 98%   BMI 29.69 kg/m    PHQ-9:  Depression screen [facility] 2/9 09/19/2020 07/09/2020 05/20/2020  Decreased Interest 2 0 0  Down, Depressed, Hopeless '3 2 2  ' PHQ - 2 Score '5 2 2  ' Altered sleeping '3 2 3  ' Tired, decreased energy 3 2 0  Change in appetite '2 2 2  ' Feeling bad or failure about yourself  0 0 0  Trouble concentrating 0 0 0  Moving slowly or fidgety/restless 0 0 0  Suicidal thoughts 0 0 0  PHQ-9 Score '13 8 7  ' Difficult doing work/chores Somewhat difficult Somewhat difficult -  Some recent data might be hidden     GAD-7: No flowsheet data found.   Physical Exam General: Awake, alert, oriented Cardiovascular: Regular rate and rhythm, S1 and S2 present, systolic murmur in atrial and pulmonic  auscultation locations Respiratory: Lung fields clear to auscultation bilaterally Extremities: Trace pitting edema of bilateral lower extremities, palpable pedal and pretibial pulses bilaterally  ASSESSMENT/PLAN:   Type 2 diabetes mellitus without complications (HCC) W9N today 6.8, putting her within the diabetic range.  Previously in prediabetic range.  Discussed additional medications, patient would like to stop metformin.  Patient would prefer pills over injections, declines Trulicity or Ozempic at this time.   - Per patient preference, will stop metformin 500 mg daily.  - Prescribed Jardiance 10 mg daily, follow-up in 4 weeks.  Expect increased to 25 mg daily at that time.  - Patient could not urinate today, will collect urine sample for UA and urine microalbumin in the future. - Referred to ophtho for routine diabetic eye exam - Next A1c due at 4-week follow-up.   Elevated alkaline phosphatase level Normal PTH, calcium in May 2022.  Alk phos isoenzymes indicate hepatobiliary etiology.  Order RUQ ultrasound to rule out hepatic structural etiologies for elevated alkaline phosphatase.  History of lung cancer Patient would like to go back to [facility] pulmonology for follow-up, but needed referral.  Referral placed.  No pulmonary complaints at this time.  Lungs CTAB on exam.  Systolic murmur Soft 2/6 systolic ejection murmur of left and right sternal borders on physical exam today.  Chart review indicates previous TTE 12/2009 demonstrating  normal EF, some LVH, and trivial tricuspid and mitral regurg.  Do not see any evidence of cardiology involvement per chart.  Given that patient has intermittent dizzy episodes (mentioned at end of visit) will send for ECHO.    Ezequiel Essex, MD [place]

## 2020-09-19 NOTE — Assessment & Plan Note (Signed)
Normal PTH, calcium in May 2022.  Alk phos isoenzymes indicate hepatobiliary etiology.  Order RUQ ultrasound to rule out hepatic structural etiologies for elevated alkaline phosphatase.

## 2020-09-19 NOTE — Assessment & Plan Note (Addendum)
A1c today 6.8, putting her within the diabetic range.  Previously in prediabetic range.  Discussed additional medications, patient would like to stop metformin.  Patient would prefer pills over injections, declines Trulicity or Ozempic at this time.   - Per patient preference, will stop metformin 500 mg daily.  - Prescribed Jardiance 10 mg daily, follow-up in 4 weeks.  Expect increased to 25 mg daily at that time.  - Patient could not urinate today, will collect urine sample for UA and urine microalbumin in the future. - Referred to ophtho for routine diabetic eye exam - Next A1c due at 4-week follow-up.

## 2020-09-19 NOTE — Assessment & Plan Note (Signed)
Soft 2/6 systolic ejection murmur of left and right sternal borders on physical exam today.  Chart review indicates previous TTE 12/2009 demonstrating normal EF, some LVH, and trivial tricuspid and mitral regurg.  Do not see any evidence of cardiology involvement per chart.  Given that patient has intermittent dizzy episodes (mentioned at end of visit) will send for ECHO.

## 2020-09-19 NOTE — Patient Instructions (Addendum)
It was wonderful to meet you today. Thank you for allowing me to be a part of your care. Below is a short summary of what we discussed at your visit today:  Difficulty swallowing -I am sending you to a speech-language pathologist who can evaluate your swallowing and possibly do imaging to see if anything is getting stuck -The SLP should give you a call to schedule an appointment, if you do not hear anything back in 1 to 2 weeks let us know  Elevated alkaline phosphatase -Is the liver lab that you are concerned about - We will get an ultrasound of your liver to make sure thing looks normal. If the results are normal, I will send you a letter or MyChart message. If the results are abnormal, I will give you a call.   -The nurse will schedule it and give you information on the appointment  Heart murmur - While I do not think this is anything serious, I want to get a scan of your heart and make sure all the valves are working correctly - The nurse will also help schedule this and give you information about the appointment - This will be at the heart center  Pulmonology - Today he said he wanted to get back in touch with your old pulmonologist for follow-up - I have placed referral to your old pulmonology clinic, [facility] -If you not hear from them in a week or 2 about an appointment, please call them directly to set up an appointment.  Diabetes medications - Your A1c puts you in the range of diabetes - Stop metformin - Start Jardiance, I have sent this to your pharmacy   Please bring all of your medications to every appointment!  If you have any questions or concerns, please do not hesitate to contact us via phone or MyChart message.   Ezequiel Essex, MD

## 2020-09-19 NOTE — Assessment & Plan Note (Signed)
Patient would like to go back to [facility] pulmonology for follow-up, but needed referral.  Referral placed.  No pulmonary complaints at this time.  Lungs CTAB on exam.

## 2020-09-20 LAB — URINALYSIS
Bilirubin, UA: NEGATIVE
Glucose, UA: NEGATIVE
Ketones, UA: NEGATIVE
Leukocytes,UA: NEGATIVE
Nitrite, UA: NEGATIVE
Protein,UA: NEGATIVE
RBC, UA: NEGATIVE
Specific Gravity, UA: 1.018 (ref 1.005–1.030)
Urobilinogen, Ur: 1 mg/dL (ref 0.2–1.0)
pH, UA: 7.5 (ref 5.0–7.5)

## 2020-09-20 LAB — MICROALBUMIN, URINE: Microalbumin, Urine: 3.9 ug/mL

## 2020-09-22 ENCOUNTER — Encounter: Payer: Self-pay | Admitting: Family Medicine

## 2020-09-24 ENCOUNTER — Telehealth: Payer: Self-pay

## 2020-09-24 NOTE — Telephone Encounter (Signed)
Patient calls nurse line regarding results and upcoming echocardiogram. Patient informed of results per result letter from PCP.   Patient reports that insurance is not going to cover echocardiogram. Patient wanted to make provider aware.   Talbot Grumbling, RN

## 2020-09-25 ENCOUNTER — Ambulatory Visit (HOSPITAL_COMMUNITY): Admission: RE | Admit: 2020-09-25 | Payer: Medicare Other | Source: Ambulatory Visit

## 2020-09-26 ENCOUNTER — Other Ambulatory Visit: Payer: Self-pay | Admitting: Family Medicine

## 2020-09-26 DIAGNOSIS — I10 Essential (primary) hypertension: Secondary | ICD-10-CM

## 2020-10-02 ENCOUNTER — Ambulatory Visit
Admission: RE | Admit: 2020-10-02 | Discharge: 2020-10-02 | Disposition: A | Discharge status: Home / Self Care | Payer: Medicare Other | Source: Ambulatory Visit | Attending: Family Medicine | Admitting: Family Medicine

## 2020-10-02 DIAGNOSIS — K760 Fatty (change of) liver, not elsewhere classified: Secondary | ICD-10-CM | POA: Diagnosis not present

## 2020-10-02 DIAGNOSIS — R748 Abnormal levels of other serum enzymes: Secondary | ICD-10-CM

## 2020-10-02 DIAGNOSIS — K8020 Calculus of gallbladder without cholecystitis without obstruction: Secondary | ICD-10-CM | POA: Diagnosis not present

## 2020-10-04 ENCOUNTER — Encounter: Payer: Self-pay | Admitting: Family Medicine

## 2020-10-04 DIAGNOSIS — K760 Fatty (change of) liver, not elsewhere classified: Secondary | ICD-10-CM | POA: Insufficient documentation

## 2020-10-04 DIAGNOSIS — K8020 Calculus of gallbladder without cholecystitis without obstruction: Secondary | ICD-10-CM

## 2020-10-04 HISTORY — DX: Calculus of gallbladder without cholecystitis without obstruction: K80.20

## 2020-10-04 NOTE — Progress Notes (Signed)
Letter regarding RUQ [place] results. Will call patient Monday morning to discuss.  Ezequiel Essex, MD

## 2020-10-06 ENCOUNTER — Telehealth: Payer: Self-pay

## 2020-10-06 ENCOUNTER — Telehealth: Payer: Self-pay | Admitting: Family Medicine

## 2020-10-06 NOTE — Telephone Encounter (Signed)
Called patient to discuss RUQ ultrasound results.  Verified patient by name and birthday.  Discussed asymptomatic gallstones and fatty liver.  Believe fatty liver to be etiology of elevated alk phos previously seen in labs.  Discussed with patient how best treatment for fatty liver is 5 to 7% weight loss gradually (1 pound per week or so), control of high cholesterol, and good glycemic control.  Patient incidentally mentioned that the Jardiance 10 mg daily was making her ill.  She reports beginning to vomit on day #2 and again on day #3.  She asks if it is okay to go back to her metformin.  Of note, metformin 500 mg daily was stopped at her last visit because she believed it to be causing her hair to fall out and giving her some diarrhea.  Instructed her to go back on her previous dose of metformin.   Return precautions given.   Ezequiel Essex, MD

## 2020-10-06 NOTE — Telephone Encounter (Signed)
Patient calls nurse line requesting to speak with Dr. Jeani Hawking regarding recent ultrasound results. Patient has further questions and would like to discuss with provider.   Talbot Grumbling, RN

## 2020-10-23 ENCOUNTER — Ambulatory Visit (INDEPENDENT_AMBULATORY_CARE_PROVIDER_SITE_OTHER): Payer: Medicare Other | Admitting: Family Medicine

## 2020-10-23 ENCOUNTER — Other Ambulatory Visit: Payer: Self-pay

## 2020-10-23 DIAGNOSIS — E119 Type 2 diabetes mellitus without complications: Secondary | ICD-10-CM | POA: Diagnosis not present

## 2020-10-23 DIAGNOSIS — K760 Fatty (change of) liver, not elsewhere classified: Secondary | ICD-10-CM | POA: Diagnosis not present

## 2020-10-23 NOTE — Patient Instructions (Addendum)
Check the nutrition facts label on most foods, especially snack foods, even if the product is "sugar-free" or "no sugar added."  It is TOTAL carbohydrate, not just sugar, that you want to limit to keep your blood sugar in control.  When you look on the nutrition facts panel, look for the number of grams of TOTAL CARBOHYDRATE (not the %).   Keeping your blood sugar in good control will also help minimize the fat in your liver.    If you are watching TV, make sure you get up and move at least 5 minutes each hour.  This helps to clear the bloodstream of "fuels" like sugar and fats.    Limit total carbohydrate at each meal:   Diet Recommendations for Diabetes  Carbohydrate includes starch, sugar, and fiber.  Of these, only sugar and starch raise blood glucose.  (Fiber is found in fruits, vegetables [especially skin, seeds, and stalks], whole grains, and beans.)   Starchy (carb) foods: Bread, rice, pasta, potatoes, corn, cereal, grits, crackers, bagels, muffins, all baked goods.  (Fruit, milk, and yogurt also have carbohydrate, but most of these foods will not spike your blood sugar as most starchy or sweet foods will.)  A few fruits do cause high blood sugars; use small portions of bananas (limit to 1/2 at a time), grapes, watermelon, and oranges.   Protein foods: Meat, fish, poultry, eggs, dairy foods, and beans such as pinto and kidney beans (beans also provide carbohydrate).   1. Limit starchy foods to TWO per meal and ONE per snack. ONE portion of a starchy food is equal to the following:   - ONE slice of bread (or its equivalent, such as half of a hamburger bun).   - 1/2 cup of a "scoopable" starchy food such as potatoes or rice.   - 15 grams of Total Carbohydrate as shown on food label.   - Every 4 ounces of a sweet drink (including fruit juice). 2. Limit fats, especially saturated fat, which is found mostly in animal products like meats and dairy.  Use no more than ~1 teaspoon of butter at a  time.  MEASURE fat you use!  A better source of fat is olive oil or canola oil.  3. Include twice the volume of vegetables as protein or carbohydrate foods for BOTH lunch and dinner as often as you can.   - Fresh or frozen vegetables are best.   - Keep frozen vegetables on hand for a quick option.     Follow-up in-office appointment on Thursday, Oct 6 at 11 AM.   If any questions, call Dr. Jenne Campus: [phone].

## 2020-10-23 NOTE — Progress Notes (Signed)
Medical Nutrition Therapy Patient has completed 2nd dose of COVID-19 vaccine. Appt start time: 1100 end time: 1200 (1 hour) Primary concerns today: DM, wt mgmt, and NAFLD.  Also has diagnoses of HTN and HLD.   Relevant history/background: Ms. Essner was diagnosed with DM about 3 yrs ago, but has never seen a RD before (referred, but did not go).  She has been trying to eat better and to exercise (started just last week).   Recently diagnosed with NAFLD.    Assessment:  Ms. Vendetti daughter has cancer for which she is undergoing treatment, including multiple recent surgeries.  This has been very stressful for Ms. Katen, and although this has not usually affected food choices, it does contribute poor sleep.  Ms. Carmack has skipped lunch for many years, and has a relatively small food intake throughout the day with few snacks.    Learning Readiness: Ready  Usual eating pattern: 2-3 meals and 0-1 snack per day. Frequent foods and beverages: water, orange juice 1/2 c 3 X wk; chx, bread, "pork&bean&weanies."    Avoided foods: sugar and sweets.   Usual physical activity: She has recently started walking with her daughter.  Walked ~30 min 4 X last week.   Sleep: Estimates she sleeps 5-6 hrs/night.  Tends to watch Westerns until 2-3 AM most nights.  Naps in the afternoon ~2 X wk.    24-hr recall: (Up at 9 AM) B (9:30 AM)-   1 egg, 1 oz Cheddar cheese, 1 coffee, 1 tbsp artif sweetener, 1/4 powdered cmr, water Snk ( AM)-   --- L ( PM)-  --- Snk (2:30)-  4 baked pork skins, water  D (6 PM)-  1 hamburger on 1/2 bun, 1 tsp slaw, 1 tbsp chili, water Snk (7 PM)-  1 sugar-free ice crm bar, water Typical day? Yes.    Nutritional Diagnosis:  NB-1.1 Food and nutrition-related knowledge deficit As related to management of DM and NAFLD.  As evidenced by numerous questions and some confusion about BG mgmt.  Handouts given during visit include: After-Visit Summary (AVS)  Demonstrated degree of  understanding via:  Teach Back  Barriers to learning/adherence to lifestyle change: Long-standing dietary habits.    Monitoring/Evaluation:  Dietary intake, exercise, and body weight in 4 week(s).

## 2020-10-27 ENCOUNTER — Other Ambulatory Visit: Payer: Self-pay | Admitting: Family Medicine

## 2020-10-27 DIAGNOSIS — J309 Allergic rhinitis, unspecified: Secondary | ICD-10-CM

## 2020-11-10 ENCOUNTER — Other Ambulatory Visit: Payer: Self-pay

## 2020-11-10 ENCOUNTER — Ambulatory Visit (INDEPENDENT_AMBULATORY_CARE_PROVIDER_SITE_OTHER): Payer: Medicare Other

## 2020-11-10 DIAGNOSIS — Z23 Encounter for immunization: Secondary | ICD-10-CM

## 2020-11-20 ENCOUNTER — Ambulatory Visit: Payer: Medicare Other | Admitting: Family Medicine

## 2020-11-27 ENCOUNTER — Ambulatory Visit (INDEPENDENT_AMBULATORY_CARE_PROVIDER_SITE_OTHER): Payer: Medicare Other | Admitting: Family Medicine

## 2020-11-27 ENCOUNTER — Other Ambulatory Visit: Payer: Self-pay

## 2020-11-27 ENCOUNTER — Encounter: Payer: Self-pay | Admitting: Family Medicine

## 2020-11-27 VITALS — Wt 164.0 lb

## 2020-11-27 DIAGNOSIS — E119 Type 2 diabetes mellitus without complications: Secondary | ICD-10-CM | POA: Diagnosis not present

## 2020-11-27 NOTE — Patient Instructions (Addendum)
It was wonderful to see you today. Thank you for allowing me to be a part of your care. Below is a short summary of what we discussed at your visit today:  Diabetes Control Thank you for coming into nutrition clinic today! I noticed from your last visit that we need to follow up on your A1c, collect a urine sample to evaluate your kidneys, and see how you're doing with your new medicine, Jardiance.  Please make a follow up appointment with me at the front desk before you leave  Nutrition for daughter If your daughter would like to be seen by a dietician at our clinic, please have her doctor fax a referral to [phone], ATTN: Dr. Jenne Campus.   Also, your daughter is welcome to come to your nutrition appointments with you to listen in.   Nutrition Clinic It was wonderful to see you in nutrition clinic. For breakfast cereals, choose one that has at least 5 grams of fiber per serving. This helps to control blood sugars.   Below are the goals we discussed at this visit:  1. Limit starchy foods to TWO per meal and ONE per snack. ONE portion of a starchy food is equal to the following:              - ONE slice of bread (or its equivalent, such as half of a hamburger bun).              - 1/2 cup of a "scoopable" starchy food such as potatoes or rice.              - 15 grams of Total Carbohydrate as shown on food label.              - Every 4 ounces of a sweet drink (including fruit juice). 2. Limit fats, especially saturated fat, which is found mostly in animal products like meats and dairy.  Use no more than ~1 teaspoon of butter at a time.  MEASURE fat you use!  A better source of fat is olive oil or canola oil.  3. Include twice the volume of vegetables as protein or carbohydrate foods for BOTH lunch and dinner as often as you can.              - Fresh or frozen vegetables are best.              - Keep frozen vegetables on hand for a quick option.      Follow up with PCP to check in on your  nutrition goals. Please make a separate appointment at the front desk on your way out.    Please bring all of your medications to every appointment!  If you have any questions or concerns, please do not hesitate to contact us via phone or MyChart message.   Ezequiel Essex, MD

## 2020-11-27 NOTE — Progress Notes (Signed)
What do you want to get out of meeting with me today? Hoping for some more good advice  Relevant history/background: T2DM, hepatic steatosis, cholelithiasis, HLD, mild cognitive impairment  Social  - daughter lives with her - taking care of daughter and patient - patient does most of the grocery shopping, med management - some problems with transportation, takes bus to store but cab back home (cab availability difficult) - no issues with paying for food  Assessment:  On a normal day, how many meals and snacks do you have? 3 meals daily and 0-1 snack per day Frequent foods and beverages: diet dr. Malachi Bonds twice weekly, water (LOTS), baked chicken, fish (baked or fried) Usual physical activity: not walking in park with daughter too much recently, does a lot of walking in the grocery store    Sleep: "I look at tv half the night", likes to watch Western movies, sometimes goes to bed 0200 - likes to cut tv off around 9 or 10 pm - gets 4-6 hours a night on average - naps every other day in afternoon for 1.5 hours  24-hr recall:  (Up at  4:30 AM) B (5:45 AM)-  1 c Grits (cooked), 2 smoked sausage links, 1 boiled egg, 1/2 c juice  Snk ( AM)-   L (1 PM)-  [employer] breaded chicken sandwich, small size unsweet tea Snk ( PM)-   D (6 PM)-  3 Baked chicken wings, 1 c brown rice, 1/2 c peas from frozen + 1/2 tsp butter, 12 oz can diet dr. Malachi Bonds, water Snk ( PM)-   Typical day? Yes.     Previous goals:  1. Limit starchy foods to TWO per meal and ONE per snack.  - doing well, finds it pretty east to limit , uses measuring cups to scoop out starches  2. Limit fats, especially saturated fat. Use no more than ~1 teaspoon of butter at a time.  MEASURE fat you use!  - - sometimes likes a little more unsalted butter, uses it on rice - thinks she's using less butter than before talking with dietician  3. Include twice the volume of vegetables as protein or carbohydrate foods for BOTH lunch and dinner  as often as you can.  - has changed from honey wheat to wheat bread  - sometimes hard to get twice as many vegetables - not identifiable barriers to doing this  Intervention: Completed diet and exercise history, and established: - SMART behavioral goals (Specific, Measurable, Action-oriented, Realistic, Time-based.)    1. Limit starchy foods to TWO per meal and ONE per snack.    2. Limit fats, especially saturated fat. Use no more than ~1 teaspoon of butter at a time.  MEASURE fat you use!   3. Include twice the volume of vegetables as protein or carbohydrate foods for BOTH lunch and dinner as often as you can.   - How will you document progress on goals?  No documentation yet. Did provide goals sheet at this visit.    Follow-up: 4 weeks.    Ezequiel Essex

## 2020-12-26 ENCOUNTER — Other Ambulatory Visit: Payer: Self-pay | Admitting: Family Medicine

## 2020-12-26 DIAGNOSIS — J309 Allergic rhinitis, unspecified: Secondary | ICD-10-CM

## 2021-01-12 ENCOUNTER — Encounter: Payer: Self-pay | Admitting: Family Medicine

## 2021-01-12 ENCOUNTER — Other Ambulatory Visit: Payer: Self-pay

## 2021-01-12 ENCOUNTER — Ambulatory Visit (INDEPENDENT_AMBULATORY_CARE_PROVIDER_SITE_OTHER): Payer: Medicare Other | Admitting: Family Medicine

## 2021-01-12 VITALS — BP 146/77 | HR 72 | Ht 63.0 in | Wt 164.0 lb

## 2021-01-12 DIAGNOSIS — L603 Nail dystrophy: Secondary | ICD-10-CM

## 2021-01-12 DIAGNOSIS — E119 Type 2 diabetes mellitus without complications: Secondary | ICD-10-CM

## 2021-01-12 DIAGNOSIS — J45909 Unspecified asthma, uncomplicated: Secondary | ICD-10-CM | POA: Diagnosis not present

## 2021-01-12 DIAGNOSIS — Z85118 Personal history of other malignant neoplasm of bronchus and lung: Secondary | ICD-10-CM | POA: Diagnosis not present

## 2021-01-12 DIAGNOSIS — R5383 Other fatigue: Secondary | ICD-10-CM | POA: Diagnosis not present

## 2021-01-12 DIAGNOSIS — R634 Abnormal weight loss: Secondary | ICD-10-CM

## 2021-01-12 DIAGNOSIS — R748 Abnormal levels of other serum enzymes: Secondary | ICD-10-CM | POA: Diagnosis not present

## 2021-01-12 DIAGNOSIS — L659 Nonscarring hair loss, unspecified: Secondary | ICD-10-CM | POA: Diagnosis not present

## 2021-01-12 DIAGNOSIS — K760 Fatty (change of) liver, not elsewhere classified: Secondary | ICD-10-CM | POA: Diagnosis not present

## 2021-01-12 LAB — POCT GLYCOSYLATED HEMOGLOBIN (HGB A1C): HbA1c, POC (controlled diabetic range): 6.8 % (ref 0.0–7.0)

## 2021-01-12 NOTE — Progress Notes (Signed)
    SUBJECTIVE:   CHIEF COMPLAINT / HPI:   Lack of energy, low appetite - lost 10 pounds since this time last year - been feeling bad - has been doing more steps recently - last colonoscopy in 2016, provider notes below: GI She had a upper Endoscopy in 09/2014:Sessile gastric body polyps. She was positive for H. pylori. Per chart review the clinic was not able to get in touch with her over the phone but sent a letter informing her of this. Patient does not recall getting treated for H. Pylori. -She also had a colonoscopy in 2016 which was unremarkable per chart  Diabetes check - A1c today 6.8, stable from last time  - tolerating medications well, no hypoglycemia  Pulmonology - requests referral back to old pulmonologist, but requires referral - last saw 12 years ago - no current complaints  - no refills requested - PMHx asthma, lung cancer s/p resection  PERTINENT  PMH / PSH:  Patient Active Problem List   Diagnosis Date Noted   Cholelithiasis without cholecystitis 10/04/2020   Hepatic steatosis 93/57/0177   Systolic murmur 93/90/3009   Elevated alkaline phosphatase level 04/10/2020   Reactive depression (situational) 02/20/2020   Mild cognitive impairment 04/09/2019   Poor short term memory 03/16/2019   Type 2 diabetes mellitus without complications ([place]) 23/30/0762   Renal mass 06/24/2018   Weight loss 06/24/2018   Mild depression 05/11/2018   Generalized anxiety disorder 05/11/2018   Major depressive disorder, recurrent episode ([place]) 02/11/2016   Allergic rhinitis 04/22/2015   Periungual wart 01/03/2014   Hypertension 02/18/2012   Asthma 02/18/2012   Hyperlipidemia      OBJECTIVE:   BP (!) 146/77   Pulse 72   Ht 5\' 3"  (1.6 m)   Wt 164 lb (74.4 kg)   SpO2 100%   BMI 29.05 kg/m    PHQ-9:  Depression screen [facility] 2/9 01/12/2021 09/19/2020 07/09/2020  Decreased Interest 2 2 0  Down, Depressed, Hopeless 0 3 2  PHQ - 2 Score 2 5 2   Altered sleeping 0 3 2  Tired,  decreased energy 3 3 2   Change in appetite 0 2 2  Feeling bad or failure about yourself  0 0 0  Trouble concentrating 0 0 0  Moving slowly or fidgety/restless 0 0 0  Suicidal thoughts 0 0 0  PHQ-9 Score 5 13 8   Difficult doing work/chores - Somewhat difficult Somewhat difficult  Some recent data might be hidden     GAD-7: No flowsheet data found.   Physical Exam General: Awake, alert, oriented Cardiovascular: Regular rate and rhythm, S1 and S2 present, no murmurs auscultated Respiratory: Lung fields clear to auscultation bilaterally  ASSESSMENT/PLAN:   Type 2 diabetes mellitus without complications (HCC) Stable, controlled. A1c 6.8, at goal. Patient back on metformin after unable to tolerate Jardiance. No change to plan. Next A1c check in six months.   History of lung cancer Patient again requests referral back to old pulmonologist. She expresses concern about weight loss because of history of lung cancer. Referral placed.   Weight loss Unintentional loss of 10 lb in last year per clinic records. Also recent fatigue, poor appetite, brittle nails, hair loss. No focal/localizing symptoms. Differential hypothyroid, anemia, depressive episode (seasonal vs reactive). Must also consider malignancy given personal history. Will start with CBC and TSH. Referral back to pulmonology as above.      Ezequiel Essex, MD [place]

## 2021-01-12 NOTE — Patient Instructions (Signed)
It was wonderful to see you today. Thank you for allowing me to be a part of your care. Below is a short summary of what we discussed at your visit today:  Diabetes Your A1c is 6.8, at goal. Keep going what you are doing!  Low energy - we are getting labs today to see why you might have low energy and mild weight loss - If the results are normal, I will send you a letter or MyChart message. If the results are abnormal, I will give you a call.    Cooking and Nutrition Classes [employer] in [place] provides many classes at low or no cost to [employer], nutrition, and agriculture.  Their website offers a huge variety of information related to topics such as gardening, nutrition, cooking, parenting, and health.  Also listed are classes and events, both online and in-person.  Check out their website here: [email or web address]     Please bring all of your medications to every appointment!  If you have any questions or concerns, please do not hesitate to contact us via phone or MyChart message.   Ezequiel Essex, MD

## 2021-01-13 ENCOUNTER — Encounter: Payer: Self-pay | Admitting: Family Medicine

## 2021-01-13 ENCOUNTER — Other Ambulatory Visit: Payer: Self-pay | Admitting: Family Medicine

## 2021-01-13 DIAGNOSIS — R634 Abnormal weight loss: Secondary | ICD-10-CM

## 2021-01-13 DIAGNOSIS — R630 Anorexia: Secondary | ICD-10-CM

## 2021-01-13 DIAGNOSIS — R5383 Other fatigue: Secondary | ICD-10-CM

## 2021-01-13 LAB — CBC WITH DIFFERENTIAL/PLATELET
Basophils Absolute: 0 10*3/uL (ref 0.0–0.2)
Basos: 1 %
EOS (ABSOLUTE): 0.3 10*3/uL (ref 0.0–0.4)
Eos: 4 %
Hematocrit: 39.4 % (ref 34.0–46.6)
Hemoglobin: 13 g/dL (ref 11.1–15.9)
Immature Grans (Abs): 0 10*3/uL (ref 0.0–0.1)
Immature Granulocytes: 0 %
Lymphocytes Absolute: 2 10*3/uL (ref 0.7–3.1)
Lymphs: 30 %
MCH: 28 pg (ref 26.6–33.0)
MCHC: 33 g/dL (ref 31.5–35.7)
MCV: 85 fL (ref 79–97)
Monocytes Absolute: 0.8 10*3/uL (ref 0.1–0.9)
Monocytes: 12 %
Neutrophils Absolute: 3.5 10*3/uL (ref 1.4–7.0)
Neutrophils: 53 %
Platelets: 287 10*3/uL (ref 150–450)
RBC: 4.64 x10E6/uL (ref 3.77–5.28)
RDW: 13.8 % (ref 11.7–15.4)
WBC: 6.6 10*3/uL (ref 3.4–10.8)

## 2021-01-13 LAB — LIPID PANEL
Chol/HDL Ratio: 2.5 ratio (ref 0.0–4.4)
Cholesterol, Total: 132 mg/dL (ref 100–199)
HDL: 53 mg/dL (ref >39)
LDL Chol Calc (NIH): 61 mg/dL (ref 0–99)
Triglycerides: 97 mg/dL (ref 0–149)
VLDL Cholesterol Cal: 18 mg/dL (ref 5–40)

## 2021-01-13 LAB — TSH: TSH: 0.721 u[IU]/mL (ref 0.450–4.500)

## 2021-01-13 NOTE — Assessment & Plan Note (Addendum)
Unintentional loss of 10 lb in last year per clinic records. Also recent fatigue, poor appetite, brittle nails, hair loss. No focal/localizing symptoms. Differential hypothyroid, anemia, depressive episode (seasonal vs reactive). Must also consider malignancy given personal history. Will start with CBC and TSH. Referral back to pulmonology as above.

## 2021-01-13 NOTE — Assessment & Plan Note (Signed)
Patient again requests referral back to old pulmonologist. She expresses concern about weight loss because of history of lung cancer. Referral placed.

## 2021-01-13 NOTE — Assessment & Plan Note (Signed)
Stable, controlled. A1c 6.8, at goal. Patient back on metformin after unable to tolerate Jardiance. No change to plan. Next A1c check in six months.

## 2021-01-20 ENCOUNTER — Institutional Professional Consult (permissible substitution): Payer: Medicare Other | Admitting: Pulmonary Disease

## 2021-01-23 ENCOUNTER — Other Ambulatory Visit: Payer: Self-pay

## 2021-01-23 ENCOUNTER — Encounter: Payer: Self-pay | Admitting: Pulmonary Disease

## 2021-01-23 ENCOUNTER — Ambulatory Visit (INDEPENDENT_AMBULATORY_CARE_PROVIDER_SITE_OTHER): Payer: Medicare Other | Admitting: Pulmonary Disease

## 2021-01-23 VITALS — BP 130/74 | HR 64 | Ht 63.0 in | Wt 155.0 lb

## 2021-01-23 DIAGNOSIS — J432 Centrilobular emphysema: Secondary | ICD-10-CM | POA: Diagnosis not present

## 2021-01-23 DIAGNOSIS — R918 Other nonspecific abnormal finding of lung field: Secondary | ICD-10-CM | POA: Diagnosis not present

## 2021-01-23 DIAGNOSIS — Z85118 Personal history of other malignant neoplasm of bronchus and lung: Secondary | ICD-10-CM

## 2021-01-23 NOTE — Progress Notes (Signed)
Synopsis: Referred in December 2022 for Asthma by Lissa Morales, MD  Subjective:   PATIENT ID: Susan Ingram GENDER: female DOB: September 12, 1940, MRN: [id number]   HPI  Chief Complaint  Patient presents with   Consult    Referred by PCP to establish with a pulmonologist for asthma. States she just wants a "check up". Denies having asthma.    Susan Ingram is an 80 year old woman, former smoker with asthma, pulmonary nodules and history of lung cancer s/p VATs resection in 2015 who is referred to pulmonary clinic for asthma evaluation.   Patient reports she has no issues with her breathing at this time.  She denies any cough, wheezing or shortness of breath.  She denies exertional shortness of breath.  She remains independent in her activities of daily living.  She is planning on posting her family for the holidays and doing all the cooking.  She expresses concern for her history of lung cancer and the pulmonary nodules.  Last CT scan was done in 2020.  She is a former smoker and quit in 1995.  She started smoking at age 45 and started out smoking 1 pack/day and later 2 packs/day until she quit.  She previously worked for a [employer], community for 21 years.  Her mother smoked and she was exposed to secondhand smoke exposure growing up.  Her daughter was recently diagnosed with lung cancer and underwent surgery at age 62.   Past Medical History:  Diagnosis Date   Arthralgia of right temporomandibular joint 10/10/2012   Arthritis    RIGHT KNEE   Asthma    Cancer ([place])    lung   Cervical stenosis of spinal canal 09/03/2015   MRI 08/2015: Degenerative changes resulting in moderate canal stenosis C3-4, mild to moderate at C4-5 and C5-6.   Neural foraminal narrowing C3-4 thru C6-7, moderate at multiple levels.   Diabetes ([place]) 02/28/2018   Dizzy spells 08/11/2013   Eczema    Eustachian tube dysfunction 07/23/2016   Heartburn 03/25/7865   Helicobacter pylori gastritis 10/08/2014   History of lung  cancer 11/24/2010   S/P Bronchoscopy, left VATS, wedge resection,lingular segmentectomy, lymph node dissection 05/23/2008, [facility]. Recently seen by [facility] on 10/02/13.  Negative CT scan. Patient now cured.    History of lung cancer 11/24/2010   S/P Bronchoscopy, left VATS, wedge resection,lingular segmentectomy, lymph node dissection 05/23/2008, [facility]. Recently seen by [facility] on 10/02/13.  Negative CT scan. Patient now cured.    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   Seborrheic keratosis 09/03/2015   Throat clearing 09/15/2013     Family History  Problem Relation Age of Onset   Alzheimer's disease Father    Early death Mother    Hypertension Mother    Stroke Mother    Diabetes Brother    Breast cancer Maternal Uncle    Breast cancer Cousin      Social History   Socioeconomic History   Marital status: Divorced    Spouse name: Not on file   Number of children: 1   Years of education: 12   Highest education level: 12th grade  Occupational History   Occupation: Retired  Tobacco Use   Smoking status: Former    Types: Cigarettes    Quit date: 04/15/1993    Years since  quitting: 27.7   Smokeless tobacco: Never  Vaping Use   Vaping Use: Never used  Substance and Sexual Activity   Alcohol use: No   Drug use: No   Sexual activity: Not Currently  Other Topics Concern   Not on file  Social History Narrative   Patient lives alone in [place].   Patient has one daughter and 7 grandchildren.    Patient typically rides the bus for transportation, however her grandchildren will help her get around as needed.   Patient enjoys watching westerns on TV, reading the paper and reading books.    Patient does not have an active work out plan, however she stays "on the move all the time."     Social Determinants of Radio broadcast assistant Strain: Low Risk    Difficulty of Paying Living Expenses: Not hard at all  Food Insecurity: No Food Insecurity   Worried About Charity fundraiser in the Last Year: Never true   Arboriculturist in the Last Year: Never true  Transportation Needs: No Transportation Needs   Lack of Transportation (Medical): No   Lack of Transportation (Non-Medical): No  Physical Activity: Inactive   Days of Exercise per Week: 0 days   Minutes of Exercise per Session: 0 min  Stress: Stress Concern Present   Feeling of Stress : To some extent  Social Connections: Socially Isolated   Frequency of Communication with Friends and Family: More than three times a week   Frequency of Social Gatherings with Friends and Family: More than three times a week   Attends Religious Services: Never   Marine scientist or Organizations: No   Attends Music therapist: Never   Marital Status: Divorced  Human resources officer Violence: Not At Risk   Fear of Current or Ex-Partner: No   Emotionally Abused: No   Physically Abused: No   Sexually Abused: No     Allergies  Allergen Reactions   Amlodipine Swelling    LEG AND ANKLE   Maxzide [Triamterene-Hctz] Other (See Comments)    Unsure of reaction   Toprol Xl [Metoprolol Tartrate] Other (See Comments)     Outpatient Medications Prior to Visit  Medication Sig Dispense Refill   albuterol (PROAIR HFA) 108 (90 Base) MCG/ACT inhaler Inhale 2 puffs into the lungs every 6 (six) hours as needed for wheezing or shortness of breath. 6.7 g 1   atorvastatin (LIPITOR) 40 MG tablet Take 1 tablet by mouth once daily 90 tablet 3   benzonatate (TESSALON) 100 MG capsule Take 1 capsule (100 mg total) by mouth 2 (two) times daily as needed for cough. 20 capsule 0   chlorthalidone (HYGROTON) 25 MG tablet Take 1 tablet by mouth once daily 90 tablet 1   EQ ALLERGY RELIEF, CETIRIZINE, 10 MG tablet Take 1/2 (one-half) tablet  by mouth once daily 90 tablet 0   fluticasone (FLONASE) 50 MCG/ACT nasal spray Use 2 spray(s) in each nostril once daily 48 g 11   metFORMIN (GLUCOPHAGE) 500 MG tablet Take by mouth 2 (two) times daily with a meal.     No facility-administered medications prior to visit.   Review of Systems  Constitutional:  Negative for chills, fever, malaise/fatigue and weight loss.  HENT:  Negative for congestion, sinus pain and sore throat.   Eyes: Negative.   Respiratory:  Negative for cough, hemoptysis, sputum production, shortness of breath and wheezing.   Cardiovascular:  Negative for chest pain, palpitations, orthopnea, claudication and  leg swelling.  Gastrointestinal:  Negative for abdominal pain, heartburn, nausea and vomiting.  Genitourinary: Negative.   Musculoskeletal:  Negative for joint pain and myalgias.  Skin:  Negative for rash.  Neurological:  Negative for weakness.  Endo/Heme/Allergies: Negative.   Psychiatric/Behavioral: Negative.     Objective:   Vitals:   01/23/21 0857  BP: 130/74  Pulse: 64  SpO2: 99%  Weight: 155 lb (70.3 kg)  Height: 5\' 3"  (1.6 m)   Physical Exam Constitutional:      General: She is not in acute distress.    Appearance: She is not ill-appearing.  HENT:     Head: Normocephalic and atraumatic.  Eyes:     General: No scleral icterus.    Conjunctiva/sclera: Conjunctivae normal.     Pupils: Pupils are equal, round, and reactive to light.  Cardiovascular:     Rate and Rhythm: Normal rate and regular rhythm.     Pulses: Normal pulses.     Heart sounds: Normal heart sounds. No murmur heard. Pulmonary:     Effort: Pulmonary effort is normal.     Breath sounds: Normal breath sounds. No wheezing, rhonchi or rales.  Abdominal:     General: Bowel sounds are normal.     Palpations: Abdomen is soft.  Musculoskeletal:     Right lower leg: No edema.     Left lower leg: No edema.  Lymphadenopathy:     Cervical: No cervical adenopathy.  Skin:     General: Skin is warm and dry.  Neurological:     General: No focal deficit present.     Mental Status: She is alert.  Psychiatric:        Mood and Affect: Mood normal.        Behavior: Behavior normal.        Thought Content: Thought content normal.        Judgment: Judgment normal.   CBC    Component Value Date/Time   WBC 6.6 01/12/2021 1615   WBC 7.4 08/22/2015 1708   RBC 4.64 01/12/2021 1615   RBC 4.64 08/22/2015 1708   HGB 13.0 01/12/2021 1615   HCT 39.4 01/12/2021 1615   PLT 287 01/12/2021 1615   MCV 85 01/12/2021 1615   MCH 28.0 01/12/2021 1615   MCH 27.8 08/22/2015 1708   MCHC 33.0 01/12/2021 1615   MCHC 32.7 08/22/2015 1708   RDW 13.8 01/12/2021 1615   LYMPHSABS 2.0 01/12/2021 1615   MONOABS 1.0 08/22/2015 1708   EOSABS 0.3 01/12/2021 1615   BASOSABS 0.0 01/12/2021 1615   BMP Latest Ref Rng & Units 07/11/2020 04/10/2020 12/24/2019  Glucose 65 - 99 mg/dL 114(H) 136(H) 102(H)  BUN 8 - 27 mg/dL 12 17 12   Creatinine 0.57 - 1.00 mg/dL 0.85 0.82 0.83  BUN/Creat Ratio 12 - 28 14 21 14   Sodium 134 - 144 mmol/L 137 140 138  Potassium 3.5 - 5.2 mmol/L 3.7 3.4(L) 3.4(L)  Chloride 96 - 106 mmol/L 93(L) 98 97  CO2 20 - 29 mmol/L 25 28 29   Calcium 8.7 - 10.3 mg/dL 10.2 10.6(H) 10.2   Chest imaging: CT Chest 06/29/18 Mediastinum/Nodes: Left thyroidectomy. No supraclavicular adenopathy. No mediastinal or definite hilar adenopathy, given limitations of unenhanced CT. Tiny hiatal hernia.   Lungs/Pleura: Mild left pleural thickening is unchanged.   Surgical changes of left upper lobe wedge resection.   Mild centrilobular emphysema.   A vague 3 mm right lower lobe pulmonary nodule on image 91/8 is similar and can be presumed  benign.   Left lower lobe scarring.  PFT: No flowsheet data found.  Labs:  Path:  Echo:  Heart Catheterization:  Assessment & Plan:   Pulmonary nodules - Plan: CT Chest Wo Contrast, CANCELED: CT Chest Wo Contrast  Hx of cancer of  lung  Centrilobular emphysema (HCC) - Plan: Pulmonary Function Test  Discussion: Susan Ingram is an 80 year old woman, former smoker with asthma, pulmonary nodules and history of lung cancer s/p VATs resection in 2015 who is referred to pulmonary clinic for asthma evaluation.   Patient does not have any active respiratory symptoms at this time.  She has mild centrilobular emphysematous changes on her CT scan from 2020.  She has mild left pleural thickening and scarring from the left upper lobe wedge resection.  There is also scarring of the left lower lobe which is stable.  There is a 3 mm right lower lobe nodule.  We will update her CT chest scan to monitor her history of lung cancer and pulmonary nodules.  If the scan shows no changes from the scan in 2020 I discussed with her that she would not require further follow-up for monitoring.  We will obtain pulmonary function tests at follow-up visit.  Follow-up in 6 to 8 weeks.  Freda Jackson, MD [facility] Office: [phone]   Current Outpatient Medications:    albuterol (PROAIR HFA) 108 (90 Base) MCG/ACT inhaler, Inhale 2 puffs into the lungs every 6 (six) hours as needed for wheezing or shortness of breath., Disp: 6.7 g, Rfl: 1   atorvastatin (LIPITOR) 40 MG tablet, Take 1 tablet by mouth once daily, Disp: 90 tablet, Rfl: 3   benzonatate (TESSALON) 100 MG capsule, Take 1 capsule (100 mg total) by mouth 2 (two) times daily as needed for cough., Disp: 20 capsule, Rfl: 0   chlorthalidone (HYGROTON) 25 MG tablet, Take 1 tablet by mouth once daily, Disp: 90 tablet, Rfl: 1   EQ ALLERGY RELIEF, CETIRIZINE, 10 MG tablet, Take 1/2 (one-half) tablet by mouth once daily, Disp: 90 tablet, Rfl: 0   fluticasone (FLONASE) 50 MCG/ACT nasal spray, Use 2 spray(s) in each nostril once daily, Disp: 48 g, Rfl: 11   metFORMIN (GLUCOPHAGE) 500 MG tablet, Take by mouth 2 (two) times daily with a meal., Disp: , Rfl:

## 2021-01-23 NOTE — Patient Instructions (Signed)
We will check a CT Chest scan in January for your history of pulmonary nodules and lung cancer.   Follow up in 6-8 weeks with pulmonary function tests.

## 2021-02-23 ENCOUNTER — Inpatient Hospital Stay: Admission: RE | Admit: 2021-02-23 | Payer: Medicare Other | Source: Ambulatory Visit

## 2021-02-23 ENCOUNTER — Ambulatory Visit
Admission: RE | Admit: 2021-02-23 | Discharge: 2021-02-23 | Disposition: A | Discharge status: Home / Self Care | Payer: Commercial Managed Care - HMO | Source: Ambulatory Visit | Attending: Pulmonary Disease | Admitting: Pulmonary Disease

## 2021-02-23 DIAGNOSIS — Z85110 Personal history of malignant carcinoid tumor of bronchus and lung: Secondary | ICD-10-CM | POA: Diagnosis not present

## 2021-02-23 DIAGNOSIS — R911 Solitary pulmonary nodule: Secondary | ICD-10-CM | POA: Diagnosis not present

## 2021-02-23 DIAGNOSIS — R918 Other nonspecific abnormal finding of lung field: Secondary | ICD-10-CM

## 2021-02-23 DIAGNOSIS — I700 Atherosclerosis of aorta: Secondary | ICD-10-CM | POA: Diagnosis not present

## 2021-03-17 ENCOUNTER — Ambulatory Visit: Payer: Medicare Other | Admitting: Pulmonary Disease

## 2021-03-17 DIAGNOSIS — R69 Illness, unspecified: Secondary | ICD-10-CM | POA: Diagnosis not present

## 2021-03-17 NOTE — Progress Notes (Deleted)
Synopsis: Referred in December 2022 for Asthma by Lissa Morales, MD  Subjective:   PATIENT ID: Susan Ingram GENDER: female DOB: 10-13-1940, MRN: [id number]   HPI  No chief complaint on file.  Susan Ingram is an 81 year old woman, former smoker with asthma, pulmonary nodules and history of lung cancer s/p VATs resection in 2015 who returns to pulmonary clinic for follow up.   PFTs today:   OV 01/23/21 Patient reports she has no issues with her breathing at this time.  She denies any cough, wheezing or shortness of breath.  She denies exertional shortness of breath.  She remains independent in her activities of daily living.  She is planning on posting her family for the holidays and doing all the cooking.  She expresses concern for her history of lung cancer and the pulmonary nodules.  Last CT scan was done in 2020.  She is a former smoker and quit in 1995.  She started smoking at age 89 and started out smoking 1 pack/day and later 2 packs/day until she quit.  She previously worked for a Pharmacist, community for 21 years.  Her mother smoked and she was exposed to secondhand smoke exposure growing up.  Her daughter was recently diagnosed with lung cancer and underwent surgery at age 57.   Past Medical History:  Diagnosis Date   Arthralgia of right temporomandibular joint 10/10/2012   Arthritis    RIGHT KNEE   Asthma    Cancer ([place])    lung   Cervical stenosis of spinal canal 09/03/2015   MRI 08/2015: Degenerative changes resulting in moderate canal stenosis C3-4, mild to moderate at C4-5 and C5-6.   Neural foraminal narrowing C3-4 thru C6-7, moderate at multiple levels.   Diabetes ([place]) 02/28/2018   Dizzy spells 08/11/2013   Eczema    Eustachian tube dysfunction 07/23/2016   Heartburn 5/0/0938   Helicobacter pylori gastritis 10/08/2014   History of lung cancer 11/24/2010   S/P Bronchoscopy, left VATS, wedge resection,lingular segmentectomy, lymph node dissection 05/23/2008,  [facility]. Recently seen by [facility] on 10/02/13.  Negative CT scan. Patient now cured.    History of lung cancer 11/24/2010   S/P Bronchoscopy, left VATS, wedge resection,lingular segmentectomy, lymph node dissection 05/23/2008, [facility]. Recently seen by [facility] on 10/02/13.  Negative CT scan. Patient now cured.    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   Seborrheic keratosis 09/03/2015   Throat clearing 09/15/2013     Family History  Problem Relation Age of Onset   Alzheimer's disease Father    Early death Mother    Hypertension Mother    Stroke Mother    Diabetes Brother    Breast cancer Maternal Uncle    Breast cancer Cousin      Social History   Socioeconomic History   Marital status: Divorced    Spouse name: Not on file   Number of children: 1   Years of education: 12   Highest education level: 12th grade  Occupational History   Occupation: Retired  Tobacco Use   Smoking status: Former    Types: Cigarettes    Quit date: 04/15/1993    Years since quitting: 27.9   Smokeless tobacco: Never  Vaping Use   Vaping Use: Never used  Substance and Sexual Activity   Alcohol  use: No   Drug use: No   Sexual activity: Not Currently  Other Topics Concern   Not on file  Social History Narrative   Patient lives alone in [place].   Patient has one daughter and 7 grandchildren.    Patient typically rides the bus for transportation, however her grandchildren will help her get around as needed.   Patient enjoys watching westerns on TV, reading the paper and reading books.    Patient does not have an active work out plan, however she stays "on the move all the time."    Social Determinants of Radio broadcast assistant Strain: Low Risk    Difficulty of Paying Living Expenses: Not hard at  all  Food Insecurity: No Food Insecurity   Worried About Charity fundraiser in the Last Year: Never true   Arboriculturist in the Last Year: Never true  Transportation Needs: No Transportation Needs   Lack of Transportation (Medical): No   Lack of Transportation (Non-Medical): No  Physical Activity: Inactive   Days of Exercise per Week: 0 days   Minutes of Exercise per Session: 0 min  Stress: Stress Concern Present   Feeling of Stress : To some extent  Social Connections: Socially Isolated   Frequency of Communication with Friends and Family: More than three times a week   Frequency of Social Gatherings with Friends and Family: More than three times a week   Attends Religious Services: Never   Marine scientist or Organizations: No   Attends Music therapist: Never   Marital Status: Divorced  Human resources officer Violence: Not At Risk   Fear of Current or Ex-Partner: No   Emotionally Abused: No   Physically Abused: No   Sexually Abused: No     Allergies  Allergen Reactions   Amlodipine Swelling    LEG AND ANKLE   Maxzide [Triamterene-Hctz] Other (See Comments)    Unsure of reaction   Toprol Xl [Metoprolol Tartrate] Other (See Comments)     Outpatient Medications Prior to Visit  Medication Sig Dispense Refill   albuterol (PROAIR HFA) 108 (90 Base) MCG/ACT inhaler Inhale 2 puffs into the lungs every 6 (six) hours as needed for wheezing or shortness of breath. 6.7 g 1   atorvastatin (LIPITOR) 40 MG tablet Take 1 tablet by mouth once daily 90 tablet 3   benzonatate (TESSALON) 100 MG capsule Take 1 capsule (100 mg total) by mouth 2 (two) times daily as needed for cough. 20 capsule 0   chlorthalidone (HYGROTON) 25 MG tablet Take 1 tablet by mouth once daily 90 tablet 1   EQ ALLERGY RELIEF, CETIRIZINE, 10 MG tablet Take 1/2 (one-half) tablet by mouth once daily 90 tablet 0   fluticasone (FLONASE) 50 MCG/ACT nasal spray Use 2 spray(s) in each nostril once daily 48 g  11   metFORMIN (GLUCOPHAGE) 500 MG tablet Take by mouth 2 (two) times daily with a meal.     No facility-administered medications prior to visit.   Review of Systems  Constitutional:  Negative for chills, fever, malaise/fatigue and weight loss.  HENT:  Negative for congestion, sinus pain and sore throat.   Eyes: Negative.   Respiratory:  Negative for cough, hemoptysis, sputum production, shortness of breath and wheezing.   Cardiovascular:  Negative for chest pain, palpitations, orthopnea, claudication and leg swelling.  Gastrointestinal:  Negative for abdominal pain, heartburn, nausea and vomiting.  Genitourinary: Negative.   Musculoskeletal:  Negative for joint pain  and myalgias.  Skin:  Negative for rash.  Neurological:  Negative for weakness.  Endo/Heme/Allergies: Negative.   Psychiatric/Behavioral: Negative.     Objective:   There were no vitals filed for this visit.  Physical Exam Constitutional:      General: She is not in acute distress.    Appearance: She is not ill-appearing.  HENT:     Head: Normocephalic and atraumatic.  Eyes:     General: No scleral icterus.    Conjunctiva/sclera: Conjunctivae normal.     Pupils: Pupils are equal, round, and reactive to light.  Cardiovascular:     Rate and Rhythm: Normal rate and regular rhythm.     Pulses: Normal pulses.     Heart sounds: Normal heart sounds. No murmur heard. Pulmonary:     Effort: Pulmonary effort is normal.     Breath sounds: Normal breath sounds. No wheezing, rhonchi or rales.  Abdominal:     General: Bowel sounds are normal.     Palpations: Abdomen is soft.  Musculoskeletal:     Right lower leg: No edema.     Left lower leg: No edema.  Lymphadenopathy:     Cervical: No cervical adenopathy.  Skin:    General: Skin is warm and dry.  Neurological:     General: No focal deficit present.     Mental Status: She is alert.  Psychiatric:        Mood and Affect: Mood normal.        Behavior: Behavior  normal.        Thought Content: Thought content normal.        Judgment: Judgment normal.   CBC    Component Value Date/Time   WBC 6.6 01/12/2021 1615   WBC 7.4 08/22/2015 1708   RBC 4.64 01/12/2021 1615   RBC 4.64 08/22/2015 1708   HGB 13.0 01/12/2021 1615   HCT 39.4 01/12/2021 1615   PLT 287 01/12/2021 1615   MCV 85 01/12/2021 1615   MCH 28.0 01/12/2021 1615   MCH 27.8 08/22/2015 1708   MCHC 33.0 01/12/2021 1615   MCHC 32.7 08/22/2015 1708   RDW 13.8 01/12/2021 1615   LYMPHSABS 2.0 01/12/2021 1615   MONOABS 1.0 08/22/2015 1708   EOSABS 0.3 01/12/2021 1615   BASOSABS 0.0 01/12/2021 1615   BMP Latest Ref Rng & Units 07/11/2020 04/10/2020 12/24/2019  Glucose 65 - 99 mg/dL 114(H) 136(H) 102(H)  BUN 8 - 27 mg/dL 12 17 12   Creatinine 0.57 - 1.00 mg/dL 0.85 0.82 0.83  BUN/Creat Ratio 12 - 28 14 21 14   Sodium 134 - 144 mmol/L 137 140 138  Potassium 3.5 - 5.2 mmol/L 3.7 3.4(L) 3.4(L)  Chloride 96 - 106 mmol/L 93(L) 98 97  CO2 20 - 29 mmol/L 25 28 29   Calcium 8.7 - 10.3 mg/dL 10.2 10.6(H) 10.2   Chest imaging: CT Chest 02/23/21 Stable postsurgical changes seen in left upper lobe. Stable right lower lobe nodule is noted which can be considered benign at this point with no further follow-up required. No significant pulmonary abnormality is noted.   Aortic Atherosclerosis  CT Chest 06/29/18 Mediastinum/Nodes: Left thyroidectomy. No supraclavicular adenopathy. No mediastinal or definite hilar adenopathy, given limitations of unenhanced CT. Tiny hiatal hernia.   Lungs/Pleura: Mild left pleural thickening is unchanged.   Surgical changes of left upper lobe wedge resection.   Mild centrilobular emphysema.   A vague 3 mm right lower lobe pulmonary nodule on image 91/8 is similar and can be presumed benign.  Left lower lobe scarring.  PFT: No flowsheet data found.  Labs:  Path:  Echo:  Heart Catheterization:  Assessment & Plan:   No diagnosis  found.  Discussion: Indria Bishara is an 81 year old woman, former smoker with asthma, pulmonary nodules and history of lung cancer s/p VATs resection in 2015 who is referred to pulmonary clinic for asthma evaluation.   Patient does not have any active respiratory symptoms at this time.  She has mild centrilobular emphysematous changes on her CT scan from 2020.  She has mild left pleural thickening and scarring from the left upper lobe wedge resection.  There is also scarring of the left lower lobe which is stable.  There is a 3 mm right lower lobe nodule.  We will update her CT chest scan to monitor her history of lung cancer and pulmonary nodules.  If the scan shows no changes from the scan in 2020 I discussed with her that she would not require further follow-up for monitoring.  We will obtain pulmonary function tests at follow-up visit.  Follow-up in 6 to 8 weeks.  Freda Jackson, MD [facility] Office: [phone]   Current Outpatient Medications:    albuterol (PROAIR HFA) 108 (90 Base) MCG/ACT inhaler, Inhale 2 puffs into the lungs every 6 (six) hours as needed for wheezing or shortness of breath., Disp: 6.7 g, Rfl: 1   atorvastatin (LIPITOR) 40 MG tablet, Take 1 tablet by mouth once daily, Disp: 90 tablet, Rfl: 3   benzonatate (TESSALON) 100 MG capsule, Take 1 capsule (100 mg total) by mouth 2 (two) times daily as needed for cough., Disp: 20 capsule, Rfl: 0   chlorthalidone (HYGROTON) 25 MG tablet, Take 1 tablet by mouth once daily, Disp: 90 tablet, Rfl: 1   EQ ALLERGY RELIEF, CETIRIZINE, 10 MG tablet, Take 1/2 (one-half) tablet by mouth once daily, Disp: 90 tablet, Rfl: 0   fluticasone (FLONASE) 50 MCG/ACT nasal spray, Use 2 spray(s) in each nostril once daily, Disp: 48 g, Rfl: 11   metFORMIN (GLUCOPHAGE) 500 MG tablet, Take by mouth 2 (two) times daily with a meal., Disp: , Rfl:

## 2021-03-27 ENCOUNTER — Other Ambulatory Visit: Payer: Self-pay | Admitting: Family Medicine

## 2021-03-27 DIAGNOSIS — I10 Essential (primary) hypertension: Secondary | ICD-10-CM

## 2021-03-30 ENCOUNTER — Other Ambulatory Visit: Payer: Self-pay | Admitting: Family Medicine

## 2021-03-30 DIAGNOSIS — E119 Type 2 diabetes mellitus without complications: Secondary | ICD-10-CM

## 2021-03-30 DIAGNOSIS — I10 Essential (primary) hypertension: Secondary | ICD-10-CM

## 2021-03-30 DIAGNOSIS — R748 Abnormal levels of other serum enzymes: Secondary | ICD-10-CM

## 2021-03-30 NOTE — Progress Notes (Signed)
Chlorthalidone refill authorized. Last electrolyte May 2022. Will order BMP to be drawn in next couple weeks.  Ezequiel Essex, MD

## 2021-04-14 NOTE — Progress Notes (Signed)
? ? ?SUBJECTIVE:  ? ?CHIEF COMPLAINT / HPI:  ? ?Sharp pain of right shoulder ?- duration about one month ?- located over lateral right shoulder ?- wonders if from carrying heavy bags and taking the bus ?- patient is right handed ?- sometimes pain comes on when lifting or reaching ?- improved with massage and tylenol ?- feels like trouble with numbness, happens independently of shoulder pain, about a month too ? ?Diabetes ?- A1c check today, last 6.8% ?- current regimen is metformin 500 mg BID and atorvastatin 40 mg, tolerating well without s/e ?- no episodes of hypoglycemia ? ?Lab Results  ?Component Value Date  ? HGBA1C 6.8 04/15/2021  ? HGBA1C 6.8 01/12/2021  ? HGBA1C 6.8 07/09/2020  ? ?Lab Results  ?Component Value Date  ? MICROALBUR 30 04/26/2018  ? [place] 61 01/12/2021  ? CREATININE 0.85 07/11/2020  ? ?HTN ?- current regimen chlorthalidone 25 mg, no hypotensive episodes, tolerating well ?- last BMP check May 2025 ? ?BP Readings from Last 3 Encounters:  ?04/15/21 130/60  ?01/23/21 130/74  ?01/12/21 (!) 146/77  ?  ? ?PERTINENT  PMH / PSH:  ?Patient Active Problem List  ? Diagnosis Date Noted  ? Acute pain of right shoulder 04/15/2021  ? Cholelithiasis without cholecystitis 10/04/2020  ? Hepatic steatosis 10/04/2020  ? Systolic murmur 85/46/2703  ? Elevated alkaline phosphatase level 04/10/2020  ? Reactive depression (situational) 02/20/2020  ? Mild cognitive impairment 04/09/2019  ? Poor short term memory 03/16/2019  ? Type 2 diabetes mellitus without complications ([place]) 50/10/3816  ? Renal mass 06/24/2018  ? Weight loss 06/24/2018  ? Mild depression 05/11/2018  ? Generalized anxiety disorder 05/11/2018  ? Major depressive disorder, recurrent episode ([place]) 02/11/2016  ? Allergic rhinitis 04/22/2015  ? Periungual wart 01/03/2014  ? Hypertension 02/18/2012  ? Asthma 02/18/2012  ? Hyperlipidemia   ?  ?OBJECTIVE:  ? ?BP 130/60   Pulse 72   Ht 5\' 3"  (1.6 m)   Wt 162 lb 6 oz (73.7 kg)   SpO2 99%   BMI 28.76  kg/m?   ? ?PHQ-9:  ?Depression screen [place] 2/9 04/15/2021 01/12/2021 09/19/2020  ?Decreased Interest - 2 2  ?Down, Depressed, Hopeless 2 0 3  ?PHQ - 2 Score 2 2 5   ?Altered sleeping 2 0 3  ?Tired, decreased energy 3 3 3   ?Change in appetite 2 0 2  ?Feeling bad or failure about yourself  0 0 0  ?Trouble concentrating 0 0 0  ?Moving slowly or fidgety/restless 0 0 0  ?Suicidal thoughts 0 0 0  ?PHQ-9 Score 9 5 13   ?Difficult doing work/chores Somewhat difficult - Somewhat difficult  ?Some recent data might be hidden  ?  ? ?GAD-7: No flowsheet data found. ? ?Physical Exam ?General: Awake, alert, oriented ?Cardiovascular: Regular rate and rhythm, S1 and S2 present, no murmurs auscultated ?Respiratory: Lung fields clear to auscultation bilaterally ?Extremities: No bilateral lower extremity edema, palpable pedal and pretibial pulses bilaterally, BUE strength 5/5 and equal bilaterally ? ?ASSESSMENT/PLAN:  ? ?Type 2 diabetes mellitus without complications ([place]) ?A1c at goal today, unchanged from prior. No changes to regimen. Next recheck 6 months.  ? ?Hypertension ?At goal. Tolerating medication well. No changes at this time. Will collect BMP, last May 2022. See AVS for more.  ? ?Acute pain of right shoulder ?Subacute, duration approx 4 weeks. Patient declines XR and medication at this time. Prefers to continue managing with tylenol and massage. See AVS for more.  ?  ? ? ?Susan Essex,  MD ?[place]  ?

## 2021-04-15 ENCOUNTER — Encounter: Payer: Self-pay | Admitting: Family Medicine

## 2021-04-15 ENCOUNTER — Ambulatory Visit (INDEPENDENT_AMBULATORY_CARE_PROVIDER_SITE_OTHER): Payer: Medicare Other | Admitting: Family Medicine

## 2021-04-15 ENCOUNTER — Other Ambulatory Visit: Payer: Self-pay

## 2021-04-15 VITALS — BP 130/60 | HR 72 | Ht 63.0 in | Wt 162.4 lb

## 2021-04-15 DIAGNOSIS — I10 Essential (primary) hypertension: Secondary | ICD-10-CM

## 2021-04-15 DIAGNOSIS — M25511 Pain in right shoulder: Secondary | ICD-10-CM

## 2021-04-15 DIAGNOSIS — R748 Abnormal levels of other serum enzymes: Secondary | ICD-10-CM | POA: Diagnosis not present

## 2021-04-15 DIAGNOSIS — E119 Type 2 diabetes mellitus without complications: Secondary | ICD-10-CM | POA: Diagnosis not present

## 2021-04-15 LAB — POCT GLYCOSYLATED HEMOGLOBIN (HGB A1C): HbA1c, POC (controlled diabetic range): 6.8 % (ref 0.0–7.0)

## 2021-04-15 NOTE — Assessment & Plan Note (Signed)
At goal. Tolerating medication well. No changes at this time. Will collect BMP, last May 2022. See AVS for more.  ?

## 2021-04-15 NOTE — Patient Instructions (Addendum)
It was wonderful to see you today. Thank you for allowing me to be a part of your care. Below is a short summary of what we discussed at your visit today: ? ?Diabetes ?Your A1c today was 6.8, which is at goal!  ?Your A1c did NOT change from the last time we checked. It was 6.8 last time, too. You are doing a fantastic job! ?Today we got a urine sample to check on your kidney function.  ? ?Right shoulder pain ?If your tylenol and voltaren stop helping as much, let me know and I can send in a script for the lidocaine cream.  ? ?Blood pressure ?Today we got a blood sample to check on your potassium and creatinine. This is to make sure you're still doing well on your blood pressure medicine.  ?Your blood pressure today was perfect. Don't change a thing! ? ? ?Please bring all of your medications to every appointment! ? ?If you have any questions or concerns, please do not hesitate to contact us via phone or MyChart message.  ? ?Ezequiel Essex, MD  ?

## 2021-04-15 NOTE — Assessment & Plan Note (Signed)
Subacute, duration approx 4 weeks. Patient declines XR and medication at this time. Prefers to continue managing with tylenol and massage. See AVS for more.  ?

## 2021-04-15 NOTE — Assessment & Plan Note (Signed)
A1c at goal today, unchanged from prior. No changes to regimen. Next recheck 6 months.  ?

## 2021-04-16 ENCOUNTER — Encounter: Payer: Self-pay | Admitting: Family Medicine

## 2021-04-16 ENCOUNTER — Telehealth: Payer: Self-pay | Admitting: Family Medicine

## 2021-04-16 DIAGNOSIS — K760 Fatty (change of) liver, not elsewhere classified: Secondary | ICD-10-CM

## 2021-04-16 DIAGNOSIS — K8020 Calculus of gallbladder without cholecystitis without obstruction: Secondary | ICD-10-CM

## 2021-04-16 DIAGNOSIS — R7989 Other specified abnormal findings of blood chemistry: Secondary | ICD-10-CM

## 2021-04-16 DIAGNOSIS — R748 Abnormal levels of other serum enzymes: Secondary | ICD-10-CM

## 2021-04-16 LAB — COMPREHENSIVE METABOLIC PANEL
ALT: 38 IU/L — ABNORMAL HIGH (ref 0–32)
AST: 49 IU/L — ABNORMAL HIGH (ref 0–40)
Albumin/Globulin Ratio: 1.5 (ref 1.2–2.2)
Albumin: 4.6 g/dL (ref 3.6–4.6)
Alkaline Phosphatase: 170 IU/L — ABNORMAL HIGH (ref 44–121)
BUN/Creatinine Ratio: 17 (ref 12–28)
BUN: 14 mg/dL (ref 8–27)
Bilirubin Total: 0.9 mg/dL (ref 0.0–1.2)
CO2: 28 mmol/L (ref 20–29)
Calcium: 10.3 mg/dL (ref 8.7–10.3)
Chloride: 98 mmol/L (ref 96–106)
Creatinine, Ser: 0.82 mg/dL (ref 0.57–1.00)
Globulin, Total: 3.1 g/dL (ref 1.5–4.5)
Glucose: 104 mg/dL — ABNORMAL HIGH (ref 70–99)
Potassium: 3.4 mmol/L — ABNORMAL LOW (ref 3.5–5.2)
Sodium: 140 mmol/L (ref 134–144)
Total Protein: 7.7 g/dL (ref 6.0–8.5)
eGFR: 72 mL/min/{1.73_m2} (ref >59)

## 2021-04-16 LAB — MICROALBUMIN / CREATININE URINE RATIO
Creatinine, Urine: 88.7 mg/dL
Microalb/Creat Ratio: 6 mg/g creat (ref 0–29)
Microalbumin, Urine: 5 ug/mL

## 2021-04-16 NOTE — Telephone Encounter (Signed)
Called patient to discuss results. No answer, not able to leave VM.  ? ?Given steadily rising alk phos since 2020 and now with increased AST/ALT, will add on vitamin D lab and refer to GI for further work up.  ? ?Previous work up includes: ?- normal PTH and calcium 07/11/2020 ?- alk phos isoenzymes that indicate hepatobiliary origin 07/11/20 ?- RUQ [place] 10/02/2020 that shows hepatic steatosis with cholelithiasis ? ?Ezequiel Essex, MD ? ? ? ? ?

## 2021-04-17 ENCOUNTER — Ambulatory Visit: Payer: Commercial Managed Care - HMO | Admitting: Pulmonary Disease

## 2021-04-17 NOTE — Progress Notes (Unsigned)
Synopsis: Referred in December 2022 for Asthma by Lissa Morales, MD  Subjective:   PATIENT ID: Susan Ingram GENDER: female DOB: 01-08-1941, MRN: [id number]  HPI  No chief complaint on file.  Susan Ingram is an 81 year old woman, former smoker with asthma, pulmonary nodules and history of lung cancer s/p VATs resection in 2015 who is referred to pulmonary clinic for asthma evaluation.   Patient reports she has no issues with her breathing at this time.  She denies any cough, wheezing or shortness of breath.  She denies exertional shortness of breath.  She remains independent in her activities of daily living.  She is planning on posting her family for the holidays and doing all the cooking.  She expresses concern for her history of lung cancer and the pulmonary nodules.  Last CT scan was done in 2020.  She is a former smoker and quit in 1995.  She started smoking at age 36 and started out smoking 1 pack/day and later 2 packs/day until she quit.  She previously worked for a Pharmacist, community for 21 years.  Her mother smoked and she was exposed to secondhand smoke exposure growing up.  Her daughter was recently diagnosed with lung cancer and underwent surgery at age 48.   Past Medical History:  Diagnosis Date   Arthralgia of right temporomandibular joint 10/10/2012   Arthritis    RIGHT KNEE   Asthma    Cancer ([place])    lung   Cervical stenosis of spinal canal 09/03/2015   MRI 08/2015: Degenerative changes resulting in moderate canal stenosis C3-4, mild to moderate at C4-5 and C5-6.   Neural foraminal narrowing C3-4 thru C6-7, moderate at multiple levels.   Diabetes ([place]) 02/28/2018   Dizzy spells 08/11/2013   Eczema    Eustachian tube dysfunction 07/23/2016   Heartburn 03/26/9240   Helicobacter pylori gastritis 10/08/2014   History of lung cancer 11/24/2010   S/P Bronchoscopy, left VATS, wedge resection,lingular segmentectomy, lymph node dissection 05/23/2008, [place]. Recently seen  by [facility] on 10/02/13.  Negative CT scan. Patient now cured.    History of lung cancer 11/24/2010   S/P Bronchoscopy, left VATS, wedge resection,lingular segmentectomy, lymph node dissection 05/23/2008, [facility]. Recently seen by [facility] on 10/02/13.  Negative CT scan. Patient now cured.    History of vitamin D deficiency 02/18/2012   Hypercholesterolemia    Hypertension    Hypertension associated with diabetes ([place])    Hypokalemia 04/10/2020   Otalgia of both ears 01/03/2015   Retina disorder, left 04/22/2015   Seborrheic keratosis 09/03/2015   Throat clearing 09/15/2013     Family History  Problem Relation Age of Onset   Alzheimer's disease Father    Early death Mother    Hypertension Mother    Stroke Mother    Diabetes Brother    Breast cancer Maternal Uncle    Breast cancer Cousin      Social History   Socioeconomic History   Marital status: Divorced    Spouse name: Not on file   Number of children: 1   Years of education: 12   Highest education level: 12th grade  Occupational History   Occupation: Retired  Tobacco Use   Smoking status: Former    Types: Cigarettes    Quit date: 04/15/1993    Years since quitting: 28.0   Smokeless tobacco: Never  Vaping Use   Vaping Use: Never used  Substance and Sexual Activity   Alcohol use: No   Drug use:  No   Sexual activity: Not Currently  Other Topics Concern   Not on file  Social History Narrative   Patient lives alone in [place].   Patient has one daughter and 7 grandchildren.    Patient typically rides the bus for transportation, however her grandchildren will help her get around as needed.   Patient enjoys watching westerns on TV, reading the paper and reading books.    Patient does not have an active work out plan, however she stays "on the move all the time."    Social Determinants of Radio broadcast assistant Strain: Low Risk    Difficulty of Paying Living Expenses: Not hard at all  Food Insecurity: No  Food Insecurity   Worried About Charity fundraiser in the Last Year: Never true   Arboriculturist in the Last Year: Never true  Transportation Needs: No Transportation Needs   Lack of Transportation (Medical): No   Lack of Transportation (Non-Medical): No  Physical Activity: Inactive   Days of Exercise per Week: 0 days   Minutes of Exercise per Session: 0 min  Stress: Stress Concern Present   Feeling of Stress : To some extent  Social Connections: Socially Isolated   Frequency of Communication with Friends and Family: More than three times a week   Frequency of Social Gatherings with Friends and Family: More than three times a week   Attends Religious Services: Never   Marine scientist or Organizations: No   Attends Music therapist: Never   Marital Status: Divorced  Human resources officer Violence: Not At Risk   Fear of Current or Ex-Partner: No   Emotionally Abused: No   Physically Abused: No   Sexually Abused: No     Allergies  Allergen Reactions   Amlodipine Swelling    LEG AND ANKLE   Maxzide [Triamterene-Hctz] Other (See Comments)    Unsure of reaction   Toprol Xl [Metoprolol Tartrate] Other (See Comments)     Outpatient Medications Prior to Visit  Medication Sig Dispense Refill   albuterol (PROAIR HFA) 108 (90 Base) MCG/ACT inhaler Inhale 2 puffs into the lungs every 6 (six) hours as needed for wheezing or shortness of breath. 6.7 g 1   atorvastatin (LIPITOR) 40 MG tablet Take 1 tablet by mouth once daily 90 tablet 3   benzonatate (TESSALON) 100 MG capsule Take 1 capsule (100 mg total) by mouth 2 (two) times daily as needed for cough. 20 capsule 0   chlorthalidone (HYGROTON) 25 MG tablet Take 1 tablet by mouth once daily 90 tablet 1   EQ ALLERGY RELIEF, CETIRIZINE, 10 MG tablet Take 1/2 (one-half) tablet by mouth once daily 90 tablet 0   fluticasone (FLONASE) 50 MCG/ACT nasal spray Use 2 spray(s) in each nostril once daily 48 g 11   metFORMIN  (GLUCOPHAGE) 500 MG tablet Take by mouth 2 (two) times daily with a meal.     No facility-administered medications prior to visit.   Review of Systems  Constitutional:  Negative for chills, fever, malaise/fatigue and weight loss.  HENT:  Negative for congestion, sinus pain and sore throat.   Eyes: Negative.   Respiratory:  Negative for cough, hemoptysis, sputum production, shortness of breath and wheezing.   Cardiovascular:  Negative for chest pain, palpitations, orthopnea, claudication and leg swelling.  Gastrointestinal:  Negative for abdominal pain, heartburn, nausea and vomiting.  Genitourinary: Negative.   Musculoskeletal:  Negative for joint pain and myalgias.  Skin:  Negative  for rash.  Neurological:  Negative for weakness.  Endo/Heme/Allergies: Negative.   Psychiatric/Behavioral: Negative.     Objective:   There were no vitals filed for this visit.  Physical Exam Constitutional:      General: She is not in acute distress.    Appearance: She is not ill-appearing.  HENT:     Head: Normocephalic and atraumatic.  Eyes:     General: No scleral icterus.    Conjunctiva/sclera: Conjunctivae normal.     Pupils: Pupils are equal, round, and reactive to light.  Cardiovascular:     Rate and Rhythm: Normal rate and regular rhythm.     Pulses: Normal pulses.     Heart sounds: Normal heart sounds. No murmur heard. Pulmonary:     Effort: Pulmonary effort is normal.     Breath sounds: Normal breath sounds. No wheezing, rhonchi or rales.  Abdominal:     General: Bowel sounds are normal.     Palpations: Abdomen is soft.  Musculoskeletal:     Right lower leg: No edema.     Left lower leg: No edema.  Lymphadenopathy:     Cervical: No cervical adenopathy.  Skin:    General: Skin is warm and dry.  Neurological:     General: No focal deficit present.     Mental Status: She is alert.  Psychiatric:        Mood and Affect: Mood normal.        Behavior: Behavior normal.         Thought Content: Thought content normal.        Judgment: Judgment normal.   CBC    Component Value Date/Time   WBC 6.6 01/12/2021 1615   WBC 7.4 08/22/2015 1708   RBC 4.64 01/12/2021 1615   RBC 4.64 08/22/2015 1708   HGB 13.0 01/12/2021 1615   HCT 39.4 01/12/2021 1615   PLT 287 01/12/2021 1615   MCV 85 01/12/2021 1615   MCH 28.0 01/12/2021 1615   MCH 27.8 08/22/2015 1708   MCHC 33.0 01/12/2021 1615   MCHC 32.7 08/22/2015 1708   RDW 13.8 01/12/2021 1615   LYMPHSABS 2.0 01/12/2021 1615   MONOABS 1.0 08/22/2015 1708   EOSABS 0.3 01/12/2021 1615   BASOSABS 0.0 01/12/2021 1615   BMP Latest Ref Rng & Units 04/15/2021 07/11/2020 04/10/2020  Glucose 70 - 99 mg/dL 104(H) 114(H) 136(H)  BUN 8 - 27 mg/dL 14 12 17   Creatinine 0.57 - 1.00 mg/dL 0.82 0.85 0.82  BUN/Creat Ratio 12 - 28 17 14 21   Sodium 134 - 144 mmol/L 140 137 140  Potassium 3.5 - 5.2 mmol/L 3.4(L) 3.7 3.4(L)  Chloride 96 - 106 mmol/L 98 93(L) 98  CO2 20 - 29 mmol/L 28 25 28   Calcium 8.7 - 10.3 mg/dL 10.3 10.2 10.6(H)   Chest imaging: CT Chest 06/29/18 Mediastinum/Nodes: Left thyroidectomy. No supraclavicular adenopathy. No mediastinal or definite hilar adenopathy, given limitations of unenhanced CT. Tiny hiatal hernia.   Lungs/Pleura: Mild left pleural thickening is unchanged.   Surgical changes of left upper lobe wedge resection.   Mild centrilobular emphysema.   A vague 3 mm right lower lobe pulmonary nodule on image 91/8 is similar and can be presumed benign.   Left lower lobe scarring.  PFT: No flowsheet data found.  Labs:  Path:  Echo:  Heart Catheterization:  Assessment & Plan:   No diagnosis found.  Discussion: Susan Ingram is an 81 year old woman, former smoker with asthma, pulmonary nodules and history  of lung cancer s/p VATs resection in 2015 who is referred to pulmonary clinic for asthma evaluation.   Patient does not have any active respiratory symptoms at this time.  She has mild  centrilobular emphysematous changes on her CT scan from 2020.  She has mild left pleural thickening and scarring from the left upper lobe wedge resection.  There is also scarring of the left lower lobe which is stable.  There is a 3 mm right lower lobe nodule.  We will update her CT chest scan to monitor her history of lung cancer and pulmonary nodules.  If the scan shows no changes from the scan in 2020 I discussed with her that she would not require further follow-up for monitoring.  We will obtain pulmonary function tests at follow-up visit.  Follow-up in 6 to 8 weeks.  Freda Jackson, MD [facility] Office: [phone]   Current Outpatient Medications:    albuterol (PROAIR HFA) 108 (90 Base) MCG/ACT inhaler, Inhale 2 puffs into the lungs every 6 (six) hours as needed for wheezing or shortness of breath., Disp: 6.7 g, Rfl: 1   atorvastatin (LIPITOR) 40 MG tablet, Take 1 tablet by mouth once daily, Disp: 90 tablet, Rfl: 3   benzonatate (TESSALON) 100 MG capsule, Take 1 capsule (100 mg total) by mouth 2 (two) times daily as needed for cough., Disp: 20 capsule, Rfl: 0   chlorthalidone (HYGROTON) 25 MG tablet, Take 1 tablet by mouth once daily, Disp: 90 tablet, Rfl: 1   EQ ALLERGY RELIEF, CETIRIZINE, 10 MG tablet, Take 1/2 (one-half) tablet by mouth once daily, Disp: 90 tablet, Rfl: 0   fluticasone (FLONASE) 50 MCG/ACT nasal spray, Use 2 spray(s) in each nostril once daily, Disp: 48 g, Rfl: 11   metFORMIN (GLUCOPHAGE) 500 MG tablet, Take by mouth 2 (two) times daily with a meal., Disp: , Rfl:

## 2021-05-05 ENCOUNTER — Telehealth: Payer: Self-pay | Admitting: *Deleted

## 2021-05-05 DIAGNOSIS — M25511 Pain in right shoulder: Secondary | ICD-10-CM

## 2021-05-05 MED ORDER — LIDOCAINE HCL URETHRAL/MUCOSAL 2 % EX GEL: 1.0000 "application " | CUTANEOUS | 1 refills | Status: DC | PRN

## 2021-05-05 NOTE — Telephone Encounter (Signed)
Lidocaine gel script sent to pharmacy. Intended for PRN use of shoulder pain.  ? ?Ezequiel Essex, MD ? ?

## 2021-05-05 NOTE — Telephone Encounter (Signed)
Patient states that a friend of hers let her use some of her xylocaine ointment 5% for her arm and it was helpful.  She wants to know if Dr. Jeani Hawking will Rx for her. Christen Bame, CMA ? ?

## 2021-05-12 NOTE — Telephone Encounter (Signed)
Patient called back about her lab results.  She heard from GI about an appointment and was confused about why she needs to go.  I tried to explain results to patient and the basics for why she needs this appt.  Susan Ingram,CMA ? ?

## 2021-06-11 ENCOUNTER — Ambulatory Visit (INDEPENDENT_AMBULATORY_CARE_PROVIDER_SITE_OTHER): Payer: Medicare Other | Admitting: Internal Medicine

## 2021-06-11 ENCOUNTER — Encounter: Payer: Self-pay | Admitting: Internal Medicine

## 2021-06-11 ENCOUNTER — Other Ambulatory Visit (INDEPENDENT_AMBULATORY_CARE_PROVIDER_SITE_OTHER): Payer: Medicare Other

## 2021-06-11 VITALS — BP 132/74 | HR 78 | Ht 66.0 in | Wt 163.0 lb

## 2021-06-11 DIAGNOSIS — K760 Fatty (change of) liver, not elsewhere classified: Secondary | ICD-10-CM | POA: Diagnosis not present

## 2021-06-11 DIAGNOSIS — R7989 Other specified abnormal findings of blood chemistry: Secondary | ICD-10-CM

## 2021-06-11 LAB — CBC WITH DIFFERENTIAL/PLATELET
Basophils Absolute: 0 10*3/uL (ref 0.0–0.1)
Basophils Relative: 0.6 % (ref 0.0–3.0)
Eosinophils Absolute: 0.2 10*3/uL (ref 0.0–0.7)
Eosinophils Relative: 3.6 % (ref 0.0–5.0)
HCT: 41.8 % (ref 36.0–46.0)
Hemoglobin: 13.9 g/dL (ref 12.0–15.0)
Lymphocytes Relative: 29.3 % (ref 12.0–46.0)
Lymphs Abs: 1.7 10*3/uL (ref 0.7–4.0)
MCHC: 33.2 g/dL (ref 30.0–36.0)
MCV: 86.6 fl (ref 78.0–100.0)
Monocytes Absolute: 0.7 10*3/uL (ref 0.1–1.0)
Monocytes Relative: 12.4 % — ABNORMAL HIGH (ref 3.0–12.0)
Neutro Abs: 3.1 10*3/uL (ref 1.4–7.7)
Neutrophils Relative %: 54.1 % (ref 43.0–77.0)
Platelets: 252 10*3/uL (ref 150.0–400.0)
RBC: 4.83 Mil/uL (ref 3.87–5.11)
RDW: 14.6 % (ref 11.5–15.5)
WBC: 5.8 10*3/uL (ref 4.0–10.5)

## 2021-06-11 LAB — FERRITIN: Ferritin: 114.6 ng/mL (ref 10.0–291.0)

## 2021-06-11 LAB — HEPATIC FUNCTION PANEL
ALT: 23 U/L (ref 0–35)
AST: 32 U/L (ref 0–37)
Albumin: 4.4 g/dL (ref 3.5–5.2)
Alkaline Phosphatase: 126 U/L — ABNORMAL HIGH (ref 39–117)
Bilirubin, Direct: 0.2 mg/dL (ref 0.0–0.3)
Total Bilirubin: 0.9 mg/dL (ref 0.2–1.2)
Total Protein: 8.1 g/dL (ref 6.0–8.3)

## 2021-06-11 NOTE — Progress Notes (Signed)
? ?Susan Ingram y.o. 05/09/1940 [id number] ? ?Assessment & Plan:  ? ?Encounter Diagnoses  ?Name Primary?  ? Abnormal LFTs (liver function tests) Yes  ? Hepatic steatosis   ? ?I think that she probably has nonalcoholic fatty liver disease.  Platelets were normal last year and between that and findings on ultrasound I do not suspect cirrhosis though I explained to her she is at risk of that and liver cancer depending upon clinical course.  Hopefully it will not get to that.  She needs additional serologic testing.  Her liver function itself looks okay.  I will calculate her fib 4 score once I get up-to-date labs.  We had a conversation about how excess carbohydrates and sugars, high fructose corn syrup are the most likely cause of nonalcoholic fatty liver disease and she should reduce and/or eliminate these as best as possible.  She could potentially benefit from additional dietitian interaction.  There really are no good therapeutic options such as medications or supplements that have been proven to help nonalcoholic fatty liver. ? ?Serologic evaluation as below. ? ?Orders Placed This Encounter  ?Procedures  ? Ferritin  ? ANA  ? Hepatitis A antibody, total  ? Hepatitis B core antibody, total  ? Hepatitis B surface antigen  ? Hepatitis B surface antibody,qualitative  ? Hepatitis C antibody  ? CBC with Differential/Platelet  ? Hepatic function panel  ? Mitochondrial antibodies  ? Anti-smooth muscle antibody, IgG  ? ?I will contact her once these results are in and make additional recommendations. ? ?I appreciate the opportunity to care for this patient. ?CC: Ezequiel Essex, MD ? ? ?Subjective:  ? ?Chief Complaint: Abnormal liver chemistries ? ?HPI ?81 year old African-American woman with a history of diabetes mellitus type 2, vitamin D deficiency, cured lung cancer, here because of abnormal transaminases and alkaline phosphatase.  This has been followed in family medicine and she is referred for further  evaluation. ? ?Evaluation to date: ? ?Recent labs 04/15/2021 with alk phos 170, AST 49 ALT 38 bilirubin 0.9 total protein 7.7 and albumin 4.6. ?May 2022 PTH intact and calcium normal ?Fractionated alkaline phosphatase with normal distribution liver bone and intestinal ?February 2022 alk phos 161 normal transaminases at 32 and 26 as well as normal bilirubin ?November 2021 alk phos 132 other labs normal GGT was normal then ?March 2020 alkaline phosphatase 126 ? ?Normal CBC and platelets November 2022 ? ?Ultrasound right upper quadrant 10/02/2020 with 1.3 cm gallstone, common bile duct 6.3 mm, diffusely increased echogenicity of the liver consistent with fatty liver.  Portal vein was patent with flow towards the liver on Doppler. ? ?She does not drink alcohol. ? ?Other problems include some mild constipation which she relates to eating too much cheese. ? ?She has some difficulty swallowing food at times but her dentures are loose and she attributes it to that. ? ?Brief diet history she drinks "a whole lot" of ginger ale that has sugar/high fructose corn syrup.  She does try to follow a diabetic diet and has seen a dietitian at some point.  She drinks Coke 0 as well. ? ?Family history of brother died of cirrhosis thought to be due to alcohol. ? ?EGD and colonoscopy in 2016 due to iron deficiency-fundic gland polyps of the stomach otherwise normal EGD and normal colonoscopy ? ?Allergies  ?Allergen Reactions  ? Amlodipine Swelling  ?  LEG AND ANKLE  ? Maxzide [Triamterene-Hctz] Other (See Comments)  ?  Unsure of reaction  ? Toprol Xl [Metoprolol  Tartrate] Other (See Comments)  ? ?Current Meds  ?Medication Sig  ? albuterol (PROAIR HFA) 108 (90 Base) MCG/ACT inhaler Inhale 2 puffs into the lungs every 6 (six) hours as needed for wheezing or shortness of breath.  ? atorvastatin (LIPITOR) 40 MG tablet Take 1 tablet by mouth once daily  ? benzonatate (TESSALON) 100 MG capsule Take 1 capsule (100 mg total) by mouth 2 (two) times  daily as needed for cough.  ? chlorthalidone (HYGROTON) 25 MG tablet Take 1 tablet by mouth once daily  ? EQ ALLERGY RELIEF, CETIRIZINE, 10 MG tablet Take 1/2 (one-half) tablet by mouth once daily  ? fluticasone (FLONASE) 50 MCG/ACT nasal spray Use 2 spray(s) in each nostril once daily  ? lidocaine (XYLOCAINE) 2 % jelly Apply 1 application. topically as needed.  ? ?Past Medical History:  ?Diagnosis Date  ? Arthralgia of right temporomandibular joint 10/10/2012  ? Arthritis   ? RIGHT KNEE  ? Asthma   ? Cancer [facility])   ? lung  ? Cervical stenosis of spinal canal 09/03/2015  ? MRI 08/2015: Degenerative changes resulting in moderate canal stenosis C3-4, mild to moderate at C4-5 and C5-6.   Neural foraminal narrowing C3-4 thru C6-7, moderate at multiple levels.  ? Diabetes ([place]) 02/28/2018  ? Dizzy spells 08/11/2013  ? Eczema   ? Eustachian tube dysfunction 07/23/2016  ? Heartburn 06/18/2014  ? Helicobacter pylori gastritis 10/08/2014  ? History of lung cancer 11/24/2010  ? S/P Bronchoscopy, left VATS, wedge resection,lingular segmentectomy, lymph node dissection 05/23/2008, [facility]. Recently seen by [facility] on 10/02/13.  Negative CT scan. Patient now cured.   ? History of lung cancer 11/24/2010  ? S/P Bronchoscopy, left VATS, wedge resection,lingular segmentectomy, lymph node dissection 05/23/2008, [place]. Recently seen by [facility] on 10/02/13.  Negative CT scan. Patient now cured.   ? History of vitamin D deficiency 02/18/2012  ? Hypercholesterolemia   ? Hypertension   ? Hypertension associated with diabetes ([place])   ? Hypokalemia 04/10/2020  ? Otalgia of both ears 01/03/2015  ? Retina disorder, left 04/22/2015  ? Seborrheic keratosis 09/03/2015  ? Throat clearing 09/15/2013  ? ?Past Surgical History:  ?Procedure Laterality Date  ? Bronchoscopy, left video-assisted thoracoscopy, wedge  05/23/2008  ? Left thyroid lobectomy, frozen section.  10/11/2002  ? VAGINAL HYSTERECTOMY  1966  ? in her 32's   ? ?Social History  ? ?Social  History Narrative  ? Patient lives alone in [place].  ? Patient has one daughter and 7 grandchildren.   ? Patient typically rides the bus for transportation, however her grandchildren will help her get around as needed.  ? Patient enjoys watching westerns on TV, reading the paper and reading books.   ? Patient does not have an active work out plan, however she stays "on the move all the time."   ? ?family history includes Alzheimer's disease in her father; Breast cancer in her cousin and maternal uncle; Diabetes in her brother; Early death in her mother; Hypertension in her mother; Stroke in her mother. ? ? ?Review of Systems ?As per HPI.  Other review of systems are negative. ? ?Objective:  ? Physical Exam ?'@BP'  132/74   Pulse 78   Ht '5\' 6"'  (1.676 m)   Wt 163 lb (73.9 kg)   BMI 26.31 kg/m? @ ? ?General:  Well-developed, well-nourished and in no acute distress ?Eyes:  anicteric. ?Lungs: Clear to auscultation bilaterally. ?Heart:   S1S2, no rubs, murmurs, gallops. ?Abdomen:  soft, non-tender, no hepatosplenomegaly, hernia, or  mass and BS+.  ?Extremities:   no edema, cyanosis or clubbing ?Neuro:  A&O x 3.  ?Psych:  appropriate mood and  Affect. ? ? ?Data Reviewed: ? ?See HPI ?

## 2021-06-11 NOTE — Patient Instructions (Signed)
Please stop drinking sugary beverages. ? ?Your provider has requested that you go to the basement level for lab work before leaving today. Press "B" on the elevator. The lab is located at the first door on the left as you exit the elevator. ? ?Due to recent changes in healthcare laws, you may see the results of your imaging and laboratory studies on MyChart before your provider has had a chance to review them.  We understand that in some cases there may be results that are confusing or concerning to you. Not all laboratory results come back in the same time frame and the provider may be waiting for multiple results in order to interpret others.  Please give [place] 48 hours in order for your provider to thoroughly review all the results before contacting the office for clarification of your results.  ? ?I appreciate the opportunity to care for you. ?Silvano Rusk, MD, [facility] ?

## 2021-06-16 LAB — ANTI-SMOOTH MUSCLE ANTIBODY, IGG: Actin (Smooth Muscle) Antibody (IGG): 73 U — ABNORMAL HIGH (ref <20)

## 2021-06-16 LAB — MITOCHONDRIAL ANTIBODIES: Mitochondrial M2 Ab, IgG: <=20 U (ref <=20.0)

## 2021-06-16 LAB — HEPATITIS C ANTIBODY
Hepatitis C Ab: NONREACTIVE
SIGNAL TO CUT-OFF: 0.11 (ref <1.00)

## 2021-06-16 LAB — HEPATITIS A ANTIBODY, TOTAL: Hepatitis A AB,Total: REACTIVE — AB

## 2021-06-16 LAB — ANTI-NUCLEAR AB-TITER (ANA TITER): ANA Titer 1: 1:1280 {titer} — ABNORMAL HIGH

## 2021-06-16 LAB — ANA: Anti Nuclear Antibody (ANA): POSITIVE — AB

## 2021-06-16 LAB — HEPATITIS B SURFACE ANTIGEN: Hepatitis B Surface Ag: NONREACTIVE

## 2021-06-16 LAB — HEPATITIS B CORE ANTIBODY, TOTAL: Hep B Core Total Ab: NONREACTIVE

## 2021-06-16 LAB — HEPATITIS B SURFACE ANTIBODY,QUALITATIVE: Hep B S Ab: NONREACTIVE

## 2021-06-19 ENCOUNTER — Other Ambulatory Visit: Payer: Self-pay | Admitting: Family Medicine

## 2021-06-19 ENCOUNTER — Other Ambulatory Visit: Payer: Self-pay

## 2021-06-19 DIAGNOSIS — Z1231 Encounter for screening mammogram for malignant neoplasm of breast: Secondary | ICD-10-CM

## 2021-06-19 DIAGNOSIS — K760 Fatty (change of) liver, not elsewhere classified: Secondary | ICD-10-CM

## 2021-06-19 DIAGNOSIS — R7989 Other specified abnormal findings of blood chemistry: Secondary | ICD-10-CM

## 2021-06-25 ENCOUNTER — Other Ambulatory Visit: Payer: Self-pay | Admitting: Family Medicine

## 2021-06-25 ENCOUNTER — Telehealth: Payer: Self-pay | Admitting: Internal Medicine

## 2021-06-25 DIAGNOSIS — Z961 Presence of intraocular lens: Secondary | ICD-10-CM | POA: Diagnosis not present

## 2021-06-25 DIAGNOSIS — E119 Type 2 diabetes mellitus without complications: Secondary | ICD-10-CM | POA: Diagnosis not present

## 2021-06-25 DIAGNOSIS — H40013 Open angle with borderline findings, low risk, bilateral: Secondary | ICD-10-CM | POA: Diagnosis not present

## 2021-06-25 DIAGNOSIS — J309 Allergic rhinitis, unspecified: Secondary | ICD-10-CM

## 2021-06-25 NOTE — Telephone Encounter (Signed)
Pt questioned why she needed the Biopsy: Pt chart reviewed and made her aware of recent labs results and Dr. Carlean Purl recommendations: Pt verbalized understanding with all questions answered.  ? ?

## 2021-06-25 NOTE — Telephone Encounter (Signed)
Inbound call from patient requesting a call back to discuss why she is scheduled for a biopsy on 5/23 at [facility]. Please advise.  ?

## 2021-07-01 LAB — HM DIABETES EYE EXAM

## 2021-07-06 ENCOUNTER — Other Ambulatory Visit: Payer: Self-pay | Admitting: Radiology

## 2021-07-06 DIAGNOSIS — R7989 Other specified abnormal findings of blood chemistry: Secondary | ICD-10-CM

## 2021-07-06 NOTE — H&P (Signed)
Chief Complaint: Patient was seen in consultation today for abnormal LFTs at the request of Silvano Rusk, MD  Referring Physician(s): Silvano Rusk, MD  Supervising Physician: Aletta Edouard  Patient Status: [facility] - Out-pt  History of Present Illness: Susan Ingram is a 81 y.o. female with past medical history significant for lung cancer, DM type II and HTN.  Patient is being followed by Dr. Silvano Rusk for abnormal LFTs and hepatic steatosis suspicious for nonalcoholic fatty liver disease.  Patient was referred to IR by Dr. Carlean Purl for random liver biopsy.  Past Medical History:  Diagnosis Date   Arthralgia of right temporomandibular joint 10/10/2012   Arthritis    RIGHT KNEE   Asthma    Cancer ([place])    lung   Cervical stenosis of spinal canal 09/03/2015   MRI 08/2015: Degenerative changes resulting in moderate canal stenosis C3-4, mild to moderate at C4-5 and C5-6.   Neural foraminal narrowing C3-4 thru C6-7, moderate at multiple levels.   Diabetes ([place]) 02/28/2018   Dizzy spells 08/11/2013   Eczema    Eustachian tube dysfunction 07/23/2016   Heartburn 10/18/9028   Helicobacter pylori gastritis 10/08/2014   History of lung cancer 11/24/2010   S/P Bronchoscopy, left VATS, wedge resection,lingular segmentectomy, lymph node dissection 05/23/2008, [place]. Recently seen by [facility] on 10/02/13.  Negative CT scan. Patient now cured.    History of lung cancer 11/24/2010   S/P Bronchoscopy, left VATS, wedge resection,lingular segmentectomy, lymph node dissection 05/23/2008, [place]. Recently seen by [facility] on 10/02/13.  Negative CT scan. Patient now cured.    History of vitamin D deficiency 02/18/2012   Hypercholesterolemia    Hypertension    Hypertension associated with diabetes ([place])    Hypokalemia 04/10/2020   Otalgia of both ears 01/03/2015   Retina disorder, left 04/22/2015   Seborrheic keratosis 09/03/2015   Throat clearing 09/15/2013    Past Surgical History:   Procedure Laterality Date   Bronchoscopy, left video-assisted thoracoscopy, wedge  05/23/2008   Left thyroid lobectomy, frozen section.  10/11/2002   VAGINAL HYSTERECTOMY  1966   in her 20's     Allergies: Amlodipine, Maxzide [triamterene-hctz], and Toprol xl [metoprolol tartrate]  Medications: Prior to Admission medications   Medication Sig Start Date End Date Taking? Authorizing Provider  albuterol (PROAIR HFA) 108 (90 Base) MCG/ACT inhaler Inhale 2 puffs into the lungs every 6 (six) hours as needed for wheezing or shortness of breath. 12/12/19   Wilber Oliphant, MD  atorvastatin (LIPITOR) 40 MG tablet Take 1 tablet by mouth once daily 08/06/20   Wilber Oliphant, MD  benzonatate (TESSALON) 100 MG capsule Take 1 capsule (100 mg total) by mouth 2 (two) times daily as needed for cough. 08/21/20   Autry-Lott, Naaman Plummer, DO  chlorthalidone (HYGROTON) 25 MG tablet Take 1 tablet by mouth once daily 03/30/21   Ezequiel Essex, MD  EQ ALLERGY RELIEF, CETIRIZINE, 10 MG tablet Take 1/2 (one-half) tablet by mouth once daily 06/26/21   Ezequiel Essex, MD  fluticasone [facility]) 50 MCG/ACT nasal spray Use 2 spray(s) in each nostril once daily 06/27/20   Wilber Oliphant, MD  lidocaine (XYLOCAINE) 2 % jelly Apply 1 application. topically as needed. 05/05/21   Ezequiel Essex, MD  metFORMIN (GLUCOPHAGE) 500 MG tablet Take 500 mg by mouth daily.    [provider]  potassium chloride (K-DUR) 10 MEQ tablet Take 1 tablet (10 mEq total) by mouth daily. 02/18/12 10/03/12  Coral Spikes, DO     Family  History  Problem Relation Age of Onset   Early death Mother    Hypertension Mother    Stroke Mother    Alzheimer's disease Father    Diabetes Brother    Breast cancer Maternal Uncle    Breast cancer Cousin    Colon cancer Neg Hx     Social History   Socioeconomic History   Marital status: Divorced    Spouse name: Not on file   Number of children: 1   Years of education: 12   Highest education level: 12th  grade  Occupational History   Occupation: Retired  Tobacco Use   Smoking status: Former    Types: Cigarettes    Quit date: 04/15/1993    Years since quitting: 28.2   Smokeless tobacco: Never  Vaping Use   Vaping Use: Never used  Substance and Sexual Activity   Alcohol use: No   Drug use: No   Sexual activity: Not Currently  Other Topics Concern   Not on file  Social History Narrative   Patient lives alone in [place].   Patient has one daughter and 7 grandchildren.    Patient typically rides the bus for transportation, however her grandchildren will help her get around as needed.   Patient enjoys watching westerns on TV, reading the paper and reading books.    Patient does not have an active work out plan, however she stays "on the move all the time."    Social Determinants of Radio broadcast assistant Strain: Not on file  Food Insecurity: Not on file  Transportation Needs: Not on file  Physical Activity: Not on file  Stress: Not on file  Social Connections: Not on file    ECOG Status: {CHL ONC ECOG IR:5188416606}  Review of Systems: A 12 point ROS discussed and pertinent positives are indicated in the HPI above.  All other systems are negative.  Review of Systems  Vital Signs: There were no vitals taken for this visit.  Physical Exam  Imaging: No results found.  Labs:  CBC: Recent Labs    01/12/21 1615 06/11/21 1133  WBC 6.6 5.8  HGB 13.0 13.9  HCT 39.4 41.8  PLT 287 252.0    COAGS: No results for input(s): INR, APTT in the last 8760 hours.  BMP: Recent Labs    07/11/20 1425 04/15/21 1144  NA 137 140  K 3.7 3.4*  CL 93* 98  CO2 25 28  GLUCOSE 114* 104*  BUN 12 14  CALCIUM 10.2 10.3  CREATININE 0.85 0.82    LIVER FUNCTION TESTS: Recent Labs    07/11/20 1425 04/15/21 1144 06/11/21 1133  BILITOT 1.2 0.9 0.9  AST 30 49* 32  ALT 26 38* 23  ALKPHOS 157* 170* 126*  PROT 7.5 7.7 8.1  ALBUMIN 4.4 4.6 4.4    TUMOR MARKERS: No  results for input(s): AFPTM, CEA, CA199, CHROMGRNA in the last 8760 hours.  Assessment and Plan:  Risks and benefits of *** was discussed with the patient and/or patient's family including, but not limited to bleeding, infection, damage to adjacent structures or low yield requiring additional tests.  All of the questions were answered and there is agreement to proceed.  Consent signed and in chart.   Thank you for this interesting consult.  I greatly enjoyed meeting Susan Ingram and look forward to participating in their care.  A copy of this report was sent to the requesting provider on this date.  Electronically Signed: Tyson Alias,  NP 07/06/2021, 5:05 PM   I spent a total of {New INPT:304952001} {New Out-Pt:304952002}  {Established Out-Pt:304952003} in face to face in clinical consultation, greater than 50% of which was counseling/coordinating care for ***

## 2021-07-07 ENCOUNTER — Other Ambulatory Visit: Payer: Self-pay | Admitting: Internal Medicine

## 2021-07-07 ENCOUNTER — Encounter (HOSPITAL_COMMUNITY): Payer: Self-pay

## 2021-07-07 ENCOUNTER — Other Ambulatory Visit: Payer: Self-pay | Admitting: *Deleted

## 2021-07-07 ENCOUNTER — Other Ambulatory Visit: Payer: Self-pay

## 2021-07-07 ENCOUNTER — Ambulatory Visit (HOSPITAL_COMMUNITY)
Admission: RE | Admit: 2021-07-07 | Discharge: 2021-07-07 | Disposition: A | Discharge status: Home / Self Care | Payer: Medicare Other | Source: Ambulatory Visit | Attending: Internal Medicine | Admitting: Internal Medicine

## 2021-07-07 DIAGNOSIS — R7989 Other specified abnormal findings of blood chemistry: Secondary | ICD-10-CM | POA: Insufficient documentation

## 2021-07-07 DIAGNOSIS — K760 Fatty (change of) liver, not elsewhere classified: Secondary | ICD-10-CM | POA: Insufficient documentation

## 2021-07-07 LAB — COMPREHENSIVE METABOLIC PANEL
ALT: 22 U/L (ref 0–44)
AST: 32 U/L (ref 15–41)
Albumin: 4.1 g/dL (ref 3.5–5.0)
Alkaline Phosphatase: 106 U/L (ref 38–126)
Anion gap: 7 (ref 5–15)
BUN: 16 mg/dL (ref 8–23)
CO2: 31 mmol/L (ref 22–32)
Calcium: 9.9 mg/dL (ref 8.9–10.3)
Chloride: 100 mmol/L (ref 98–111)
Creatinine, Ser: 0.91 mg/dL (ref 0.44–1.00)
GFR, Estimated: >60 mL/min (ref >60)
Glucose, Bld: 131 mg/dL — ABNORMAL HIGH (ref 70–99)
Potassium: 3 mmol/L — ABNORMAL LOW (ref 3.5–5.1)
Sodium: 138 mmol/L (ref 135–145)
Total Bilirubin: 1.1 mg/dL (ref 0.3–1.2)
Total Protein: 8.1 g/dL (ref 6.5–8.1)

## 2021-07-07 LAB — CBC WITH DIFFERENTIAL/PLATELET
Abs Immature Granulocytes: 0.02 10*3/uL (ref 0.00–0.07)
Basophils Absolute: 0 10*3/uL (ref 0.0–0.1)
Basophils Relative: 1 %
Eosinophils Absolute: 0.2 10*3/uL (ref 0.0–0.5)
Eosinophils Relative: 4 %
HCT: 41 % (ref 36.0–46.0)
Hemoglobin: 13.6 g/dL (ref 12.0–15.0)
Immature Granulocytes: 0 %
Lymphocytes Relative: 28 %
Lymphs Abs: 1.6 10*3/uL (ref 0.7–4.0)
MCH: 28.8 pg (ref 26.0–34.0)
MCHC: 33.2 g/dL (ref 30.0–36.0)
MCV: 86.7 fL (ref 80.0–100.0)
Monocytes Absolute: 0.7 10*3/uL (ref 0.1–1.0)
Monocytes Relative: 12 %
Neutro Abs: 3.1 10*3/uL (ref 1.7–7.7)
Neutrophils Relative %: 55 %
Platelets: 265 10*3/uL (ref 150–400)
RBC: 4.73 MIL/uL (ref 3.87–5.11)
RDW: 14.6 % (ref 11.5–15.5)
WBC: 5.6 10*3/uL (ref 4.0–10.5)
nRBC: 0 % (ref 0.0–0.2)

## 2021-07-07 LAB — PROTIME-INR
INR: 1 (ref 0.8–1.2)
Prothrombin Time: 13.4 seconds (ref 11.4–15.2)

## 2021-07-07 MED ORDER — METFORMIN HCL 500 MG PO TABS: 500.0000 mg | ORAL_TABLET | Freq: Every day | ORAL | 3 refills | Status: DC

## 2021-07-07 MED ORDER — LIDOCAINE HCL 1 % IJ SOLN
INTRAMUSCULAR | Status: AC
  Filled 2021-07-07: qty 20

## 2021-07-07 MED ORDER — SODIUM CHLORIDE 0.9 % IV SOLN: INTRAVENOUS | Status: DC

## 2021-07-07 MED ORDER — GELATIN ABSORBABLE 12-7 MM EX MISC
CUTANEOUS | Status: AC
  Filled 2021-07-07: qty 1

## 2021-07-07 NOTE — Discharge Instructions (Addendum)
  Please call Interventional Radiology  [phone] to reschedule liver biopsy ([place]). Case cancelled today due to needing to stop aspirin 5 days prior to procedure day.    Stop Aspirin 5 days prior to liver biopsy procedure.

## 2021-07-14 ENCOUNTER — Other Ambulatory Visit: Payer: Self-pay | Admitting: *Deleted

## 2021-07-14 MED ORDER — FLUTICASONE PROPIONATE 50 MCG/ACT NA SUSP: NASAL | 11 refills | Status: DC

## 2021-07-16 ENCOUNTER — Ambulatory Visit (HOSPITAL_COMMUNITY): Payer: Medicare Other

## 2021-07-17 ENCOUNTER — Other Ambulatory Visit: Payer: Self-pay | Admitting: Radiology

## 2021-07-17 ENCOUNTER — Ambulatory Visit (HOSPITAL_COMMUNITY): Payer: Medicare Other

## 2021-07-17 DIAGNOSIS — R7989 Other specified abnormal findings of blood chemistry: Secondary | ICD-10-CM

## 2021-07-20 ENCOUNTER — Ambulatory Visit (HOSPITAL_COMMUNITY)
Admission: RE | Admit: 2021-07-20 | Discharge: 2021-07-20 | Disposition: A | Discharge status: Home / Self Care | Payer: Medicare Other | Source: Ambulatory Visit | Attending: Internal Medicine | Admitting: Internal Medicine

## 2021-07-20 ENCOUNTER — Encounter (HOSPITAL_COMMUNITY): Payer: Self-pay

## 2021-07-20 DIAGNOSIS — E119 Type 2 diabetes mellitus without complications: Secondary | ICD-10-CM | POA: Insufficient documentation

## 2021-07-20 DIAGNOSIS — K760 Fatty (change of) liver, not elsewhere classified: Secondary | ICD-10-CM | POA: Insufficient documentation

## 2021-07-20 DIAGNOSIS — Z85118 Personal history of other malignant neoplasm of bronchus and lung: Secondary | ICD-10-CM | POA: Diagnosis not present

## 2021-07-20 DIAGNOSIS — N882 Stricture and stenosis of cervix uteri: Secondary | ICD-10-CM | POA: Insufficient documentation

## 2021-07-20 DIAGNOSIS — Z79899 Other long term (current) drug therapy: Secondary | ICD-10-CM | POA: Diagnosis not present

## 2021-07-20 DIAGNOSIS — C7800 Secondary malignant neoplasm of unspecified lung: Secondary | ICD-10-CM | POA: Insufficient documentation

## 2021-07-20 DIAGNOSIS — R7989 Other specified abnormal findings of blood chemistry: Secondary | ICD-10-CM | POA: Insufficient documentation

## 2021-07-20 DIAGNOSIS — I10 Essential (primary) hypertension: Secondary | ICD-10-CM | POA: Insufficient documentation

## 2021-07-20 DIAGNOSIS — K754 Autoimmune hepatitis: Secondary | ICD-10-CM | POA: Insufficient documentation

## 2021-07-20 DIAGNOSIS — Z7984 Long term (current) use of oral hypoglycemic drugs: Secondary | ICD-10-CM | POA: Diagnosis not present

## 2021-07-20 DIAGNOSIS — K739 Chronic hepatitis, unspecified: Secondary | ICD-10-CM | POA: Diagnosis not present

## 2021-07-20 DIAGNOSIS — Z7982 Long term (current) use of aspirin: Secondary | ICD-10-CM | POA: Diagnosis not present

## 2021-07-20 DIAGNOSIS — K7402 Hepatic fibrosis, advanced fibrosis: Secondary | ICD-10-CM | POA: Diagnosis not present

## 2021-07-20 LAB — COMPREHENSIVE METABOLIC PANEL
ALT: 22 U/L (ref 0–44)
AST: 36 U/L (ref 15–41)
Albumin: 4.1 g/dL (ref 3.5–5.0)
Alkaline Phosphatase: 105 U/L (ref 38–126)
Anion gap: 8 (ref 5–15)
BUN: 12 mg/dL (ref 8–23)
CO2: 29 mmol/L (ref 22–32)
Calcium: 9.8 mg/dL (ref 8.9–10.3)
Chloride: 103 mmol/L (ref 98–111)
Creatinine, Ser: 0.76 mg/dL (ref 0.44–1.00)
GFR, Estimated: >60 mL/min (ref >60)
Glucose, Bld: 124 mg/dL — ABNORMAL HIGH (ref 70–99)
Potassium: 3.4 mmol/L — ABNORMAL LOW (ref 3.5–5.1)
Sodium: 140 mmol/L (ref 135–145)
Total Bilirubin: 1.3 mg/dL — ABNORMAL HIGH (ref 0.3–1.2)
Total Protein: 7.8 g/dL (ref 6.5–8.1)

## 2021-07-20 LAB — CBC WITH DIFFERENTIAL/PLATELET
Abs Immature Granulocytes: 0.02 10*3/uL (ref 0.00–0.07)
Basophils Absolute: 0 10*3/uL (ref 0.0–0.1)
Basophils Relative: 0 %
Eosinophils Absolute: 0.2 10*3/uL (ref 0.0–0.5)
Eosinophils Relative: 3 %
HCT: 39.5 % (ref 36.0–46.0)
Hemoglobin: 13.2 g/dL (ref 12.0–15.0)
Immature Granulocytes: 0 %
Lymphocytes Relative: 32 %
Lymphs Abs: 1.6 10*3/uL (ref 0.7–4.0)
MCH: 29 pg (ref 26.0–34.0)
MCHC: 33.4 g/dL (ref 30.0–36.0)
MCV: 86.8 fL (ref 80.0–100.0)
Monocytes Absolute: 0.7 10*3/uL (ref 0.1–1.0)
Monocytes Relative: 15 %
Neutro Abs: 2.4 10*3/uL (ref 1.7–7.7)
Neutrophils Relative %: 50 %
Platelets: 236 10*3/uL (ref 150–400)
RBC: 4.55 MIL/uL (ref 3.87–5.11)
RDW: 14.7 % (ref 11.5–15.5)
WBC: 5 10*3/uL (ref 4.0–10.5)
nRBC: 0 % (ref 0.0–0.2)

## 2021-07-20 LAB — PROTIME-INR
INR: 1.1 (ref 0.8–1.2)
Prothrombin Time: 13.6 seconds (ref 11.4–15.2)

## 2021-07-20 LAB — GLUCOSE, CAPILLARY: Glucose-Capillary: 108 mg/dL — ABNORMAL HIGH (ref 70–99)

## 2021-07-20 MED ORDER — MIDAZOLAM HCL 2 MG/2ML IJ SOLN
INTRAMUSCULAR | Status: AC | PRN
Start: 2021-07-20 — End: 2021-07-20
  Administered 2021-07-20: .5 mg via INTRAVENOUS

## 2021-07-20 MED ORDER — LIDOCAINE HCL 1 % IJ SOLN
INTRAMUSCULAR | Status: AC | PRN
  Administered 2021-07-20: 10 mL via INTRADERMAL

## 2021-07-20 MED ORDER — GELATIN ABSORBABLE 12-7 MM EX MISC
CUTANEOUS | Status: AC
  Filled 2021-07-20: qty 1

## 2021-07-20 MED ORDER — MIDAZOLAM HCL 2 MG/2ML IJ SOLN
INTRAMUSCULAR | Status: AC
  Filled 2021-07-20: qty 2

## 2021-07-20 MED ORDER — FENTANYL CITRATE (PF) 100 MCG/2ML IJ SOLN
INTRAMUSCULAR | Status: AC
  Filled 2021-07-20: qty 2

## 2021-07-20 MED ORDER — LIDOCAINE HCL 1 % IJ SOLN
INTRAMUSCULAR | Status: AC
  Filled 2021-07-20: qty 20

## 2021-07-20 MED ORDER — FENTANYL CITRATE (PF) 100 MCG/2ML IJ SOLN
INTRAMUSCULAR | Status: AC | PRN
  Administered 2021-07-20: 25 ug via INTRAVENOUS

## 2021-07-20 MED ORDER — SODIUM CHLORIDE 0.9 % IV SOLN: INTRAVENOUS | Status: DC

## 2021-07-20 MED ORDER — GELATIN ABSORBABLE 12-7 MM EX MISC
CUTANEOUS | Status: AC | PRN
  Administered 2021-07-20: 1 via TOPICAL

## 2021-07-20 NOTE — H&P (Addendum)
Referring Physician(s): Gatha Mayer  Supervising Physician: Michaelle Birks  Patient Status:  [facility] OP  Chief Complaint:  "I'm getting a liver biopsy"  Subjective: Patient known to IR service from prior evaluation for random liver biopsy on 5/23; procedure was not performed that day because patient was still on aspirin.  She presents again today for the procedure.  She has a past medical history significant for lung cancer, diabetes, hypertension, cervical stenosis and now with elevated liver function tests and hepatic steatosis suspicious for nonalcoholic fatty liver disease.  She currently denies fever, headache, chest pain, dyspnea, cough, abdominal/back pain, nausea, vomiting or bleeding.  Past Medical History:  Diagnosis Date   Arthralgia of right temporomandibular joint 10/10/2012   Arthritis    RIGHT KNEE   Asthma    Cancer ([place])    lung   Cervical stenosis of spinal canal 09/03/2015   MRI 08/2015: Degenerative changes resulting in moderate canal stenosis C3-4, mild to moderate at C4-5 and C5-6.   Neural foraminal narrowing C3-4 thru C6-7, moderate at multiple levels.   Diabetes ([place]) 02/28/2018   Dizzy spells 08/11/2013   Eczema    Eustachian tube dysfunction 07/23/2016   Heartburn 02/21/9676   Helicobacter pylori gastritis 10/08/2014   History of lung cancer 11/24/2010   S/P Bronchoscopy, left VATS, wedge resection,lingular segmentectomy, lymph node dissection 05/23/2008, [facility]. Recently seen by [facility] on 10/02/13.  Negative CT scan. Patient now cured.    History of lung cancer 11/24/2010   S/P Bronchoscopy, left VATS, wedge resection,lingular segmentectomy, lymph node dissection 05/23/2008, [facility]. Recently seen by [facility] on 10/02/13.  Negative CT scan. Patient now cured.    History of vitamin D deficiency 02/18/2012   Hypercholesterolemia    Hypertension    Hypertension associated with diabetes ([place])    Hypokalemia 04/10/2020   Otalgia of both ears 01/03/2015    Retina disorder, left 04/22/2015   Seborrheic keratosis 09/03/2015   Throat clearing 09/15/2013   Past Surgical History:  Procedure Laterality Date   Bronchoscopy, left video-assisted thoracoscopy, wedge  05/23/2008   Left thyroid lobectomy, frozen section.  10/11/2002   VAGINAL HYSTERECTOMY  1966   in her 20's       Allergies: Amlodipine, Maxzide [triamterene-hctz], and Toprol xl [metoprolol tartrate]  Medications: Prior to Admission medications   Medication Sig Start Date End Date Taking? Authorizing Provider  albuterol (PROAIR HFA) 108 (90 Base) MCG/ACT inhaler Inhale 2 puffs into the lungs every 6 (six) hours as needed for wheezing or shortness of breath. 12/12/19  Yes Wilber Oliphant, MD  aspirin EC 81 MG tablet Take 81 mg by mouth daily. Swallow whole.   Yes [provider]  atorvastatin (LIPITOR) 40 MG tablet Take 1 tablet by mouth once daily 08/06/20  Yes Wilber Oliphant, MD  chlorthalidone (HYGROTON) 25 MG tablet Take 1 tablet by mouth once daily 03/30/21  Yes Ezequiel Essex, MD  EQ ALLERGY RELIEF, CETIRIZINE, 10 MG tablet Take 1/2 (one-half) tablet by mouth once daily 06/26/21  Yes Ezequiel Essex, MD  fluticasone [facility]) 50 MCG/ACT nasal spray Use 2 spray(s) in each nostril once daily 07/14/21  Yes Ezequiel Essex, MD  metFORMIN (GLUCOPHAGE) 500 MG tablet Take 1 tablet (500 mg total) by mouth daily. 07/07/21  Yes Ezequiel Essex, MD  benzonatate (TESSALON) 100 MG capsule Take 1 capsule (100 mg total) by mouth 2 (two) times daily as needed for cough. 08/21/20   Autry-Lott, Naaman Plummer, DO  lidocaine (XYLOCAINE) 2 % jelly Apply 1 application. topically  as needed. 05/05/21   Ezequiel Essex, MD  potassium chloride (K-DUR) 10 MEQ tablet Take 1 tablet (10 mEq total) by mouth daily. 02/18/12 10/03/12  Coral Spikes, DO     Vital Signs: BP (!) 145/66   Pulse (!) 56   Temp 98.1 F (36.7 C) (Oral)   Resp 16   SpO2 100%   Physical Exam awake, alert.  Chest clear to auscultation  bilaterally.  Heart with slightly bradycardic but regular rhythm, soft murmur. Abdomen soft, positive bowel sounds, nontender.  No sig lower extremity edema.  Imaging: No results found.  Labs:  CBC: Recent Labs    01/12/21 1615 06/11/21 1133 07/07/21 1129  WBC 6.6 5.8 5.6  HGB 13.0 13.9 13.6  HCT 39.4 41.8 41.0  PLT 287 252.0 265    COAGS: Recent Labs    07/07/21 1129  INR 1.0    BMP: Recent Labs    04/15/21 1144 07/07/21 1129  NA 140 138  K 3.4* 3.0*  CL 98 100  CO2 28 31  GLUCOSE 104* 131*  BUN 14 16  CALCIUM 10.3 9.9  CREATININE 0.82 0.91  GFRNONAA  --  >60    LIVER FUNCTION TESTS: Recent Labs    04/15/21 1144 06/11/21 1133 07/07/21 1129  BILITOT 0.9 0.9 1.1  AST 49* 32 32  ALT 38* 23 22  ALKPHOS 170* 126* 106  PROT 7.7 8.1 8.1  ALBUMIN 4.6 4.4 4.1    Assessment and Plan: Patient known to IR service from prior evaluation for random liver biopsy on 5/23; procedure was not performed that day because patient was still on aspirin.  She presents again today for the procedure.  She has a past medical history significant for lung cancer, diabetes, hypertension, cervical stenosis and now with elevated liver function tests and hepatic steatosis suspicious for nonalcoholic fatty liver disease. Risks and benefits of procedure was discussed with the patient/daughter including, but not limited to bleeding, infection, damage to adjacent structures or low yield requiring additional tests.  All of the questions were answered and there is agreement to proceed.  Consent signed and in chart. Pt has held her aspirin.     Electronically Signed: D. Rowe Robert, PA-C 07/20/2021, 12:01 PM   I spent a total of 20 minutes at the the patient's bedside AND on the patient's hospital floor or unit, greater than 50% of which was counseling/coordinating care for image guided random liver biopsy

## 2021-07-20 NOTE — Procedures (Signed)
Vascular and Interventional Radiology Procedure Note  Patient: Susan Ingram DOB: 1940-12-30 Medical Record Number: [id number] Note Date/Time: 07/20/21 2:06 PM   Performing Physician: Michaelle Birks, MD Assistant(s): None  Diagnosis: >LFTs. AIH  Procedure: LIVER BIOPSY, NON-TARGETED  Anesthesia: Conscious Sedation Complications: None Estimated Blood Loss: Minimal Specimens: Sent for Pathology  Findings:  Successful Ultrasound-guided biopsy of liver, non-targeted. A total of 3 samples were obtained. Hemostasis of the tract was achieved using Gelfoam Slurry Embolization.  Plan: Bed rest for 2 hours.  See detailed procedure note with images in PACS. The patient tolerated the procedure well without incident or complication and was returned to Recovery in stable condition.    Michaelle Birks, MD [facility] Radiology   Pager. [place]

## 2021-07-23 LAB — SURGICAL PATHOLOGY

## 2021-07-27 ENCOUNTER — Ambulatory Visit: Payer: Medicare Other

## 2021-07-27 ENCOUNTER — Ambulatory Visit
Admission: RE | Admit: 2021-07-27 | Discharge: 2021-07-27 | Disposition: A | Discharge status: Home / Self Care | Payer: Medicare Other | Source: Ambulatory Visit | Attending: Family Medicine | Admitting: Family Medicine

## 2021-07-27 DIAGNOSIS — Z1231 Encounter for screening mammogram for malignant neoplasm of breast: Secondary | ICD-10-CM | POA: Diagnosis not present

## 2021-07-28 ENCOUNTER — Encounter: Payer: Self-pay | Admitting: Internal Medicine

## 2021-07-28 DIAGNOSIS — K754 Autoimmune hepatitis: Secondary | ICD-10-CM

## 2021-07-28 HISTORY — DX: Autoimmune hepatitis: K75.4

## 2021-07-29 ENCOUNTER — Other Ambulatory Visit: Payer: Self-pay

## 2021-07-29 DIAGNOSIS — K754 Autoimmune hepatitis: Secondary | ICD-10-CM

## 2021-07-29 DIAGNOSIS — R7989 Other specified abnormal findings of blood chemistry: Secondary | ICD-10-CM

## 2021-07-30 ENCOUNTER — Telehealth: Payer: Self-pay | Admitting: Internal Medicine

## 2021-07-30 NOTE — Telephone Encounter (Signed)
Patient called wanting you to call her.  Thank you.

## 2021-07-30 NOTE — Telephone Encounter (Signed)
Reviewed autoimmune hepatitis dx w/ her Holding off on Tx due to DM ? Try budesonide  Will discuss w/ Dawn Drazek soon

## 2021-07-30 NOTE — Telephone Encounter (Signed)
Pt called in to notify us that she has been scheduled at [facility] for 09/02/2021:

## 2021-08-03 ENCOUNTER — Ambulatory Visit (INDEPENDENT_AMBULATORY_CARE_PROVIDER_SITE_OTHER): Payer: Medicare Other | Admitting: Internal Medicine

## 2021-08-03 DIAGNOSIS — K754 Autoimmune hepatitis: Secondary | ICD-10-CM

## 2021-08-03 DIAGNOSIS — R7989 Other specified abnormal findings of blood chemistry: Secondary | ICD-10-CM | POA: Diagnosis not present

## 2021-08-06 NOTE — Telephone Encounter (Signed)
Spoke to [employer], NP  Will hold off on treatment til July visit - likely to use AZA and no steroids and we think Tx can wait for now

## 2021-09-02 ENCOUNTER — Other Ambulatory Visit: Payer: Self-pay | Admitting: Nurse Practitioner

## 2021-09-02 DIAGNOSIS — K754 Autoimmune hepatitis: Secondary | ICD-10-CM

## 2021-09-02 DIAGNOSIS — K7400 Hepatic fibrosis, unspecified: Secondary | ICD-10-CM

## 2021-09-04 ENCOUNTER — Ambulatory Visit (INDEPENDENT_AMBULATORY_CARE_PROVIDER_SITE_OTHER): Payer: Medicare Other | Admitting: Internal Medicine

## 2021-09-04 DIAGNOSIS — K754 Autoimmune hepatitis: Secondary | ICD-10-CM | POA: Diagnosis not present

## 2021-09-04 DIAGNOSIS — R7989 Other specified abnormal findings of blood chemistry: Secondary | ICD-10-CM | POA: Diagnosis not present

## 2021-09-04 NOTE — Progress Notes (Signed)
Pt presents today for nurse visit for an injection. Per order, injection of Heplisav-B given  in Right deltoid today by Gillermina Hu RN:  Denies any adverse reactions or discomfort.  Lot# [id number] Exp 15 Nov 2023

## 2021-09-14 ENCOUNTER — Ambulatory Visit
Admission: RE | Admit: 2021-09-14 | Discharge: 2021-09-14 | Disposition: A | Discharge status: Home / Self Care | Payer: Medicare Other | Source: Ambulatory Visit | Attending: Nurse Practitioner | Admitting: Nurse Practitioner

## 2021-09-14 DIAGNOSIS — K7400 Hepatic fibrosis, unspecified: Secondary | ICD-10-CM

## 2021-09-14 DIAGNOSIS — K8020 Calculus of gallbladder without cholecystitis without obstruction: Secondary | ICD-10-CM | POA: Diagnosis not present

## 2021-09-14 DIAGNOSIS — K754 Autoimmune hepatitis: Secondary | ICD-10-CM

## 2021-09-28 ENCOUNTER — Other Ambulatory Visit: Payer: Self-pay | Admitting: Family Medicine

## 2021-09-28 DIAGNOSIS — I10 Essential (primary) hypertension: Secondary | ICD-10-CM

## 2021-09-30 ENCOUNTER — Ambulatory Visit: Payer: Medicare Other | Admitting: Family Medicine

## 2021-10-05 ENCOUNTER — Telehealth: Payer: Self-pay

## 2021-10-05 ENCOUNTER — Encounter: Payer: Self-pay | Admitting: Family Medicine

## 2021-10-05 DIAGNOSIS — K7400 Hepatic fibrosis, unspecified: Secondary | ICD-10-CM | POA: Insufficient documentation

## 2021-10-05 NOTE — Telephone Encounter (Signed)
Patient calls nurse line wanting PCP opinion on drinking Ensure.   Patient reports someone gave her a whole box and she doesn't want them to go to waste. Patients only reservation is her liver problems.   Will froward to PCP for advisement.

## 2021-10-13 NOTE — Patient Instructions (Incomplete)
It was wonderful to see you today. Thank you for allowing me to be a part of your care. Below is a short summary of what we discussed at your visit today:  Diabetes Today we checked your A1c, it was stable from last time.  No changes to your regimen.  Keep up the good work!  Your next diabetic eye exam with Dr. Katy Fitch will be due in May.  Autoimmune hepatitis I believe drinking the Ensure supplements will be fine.  Weight loss I think this is due to to your improved diet that you are trying for your diabetes.  Today you are 162 pounds, similar to your weights in the past.  Keep [place] updated and if you notice any big changes, let me know.  Right arm Will I do not think you have a rotator cuff tear, I am worried that you have some inflammation of the tendons in the shoulder area.  You may have a rotator cuff inflammation or a biceps tendon inflammation.  I am sending you to sports medicine for an ultrasound of your shoulder.  The sports medicine clinic should reach out to you directly to schedule an appointment.    Please bring all of your medications to every appointment!  If you have any questions or concerns, please do not hesitate to contact us via phone or MyChart message.   Ezequiel Essex, MD

## 2021-10-14 ENCOUNTER — Ambulatory Visit (INDEPENDENT_AMBULATORY_CARE_PROVIDER_SITE_OTHER): Payer: Medicare Other | Admitting: Family Medicine

## 2021-10-14 ENCOUNTER — Encounter: Payer: Self-pay | Admitting: Family Medicine

## 2021-10-14 VITALS — BP 135/87 | HR 60 | Temp 98.2°F | Ht 66.0 in | Wt 162.0 lb

## 2021-10-14 DIAGNOSIS — E119 Type 2 diabetes mellitus without complications: Secondary | ICD-10-CM | POA: Diagnosis not present

## 2021-10-14 DIAGNOSIS — K5900 Constipation, unspecified: Secondary | ICD-10-CM

## 2021-10-14 DIAGNOSIS — M25511 Pain in right shoulder: Secondary | ICD-10-CM | POA: Diagnosis not present

## 2021-10-14 LAB — POCT GLYCOSYLATED HEMOGLOBIN (HGB A1C): HbA1c, POC (controlled diabetic range): 6.7 % (ref 0.0–7.0)

## 2021-10-14 MED ORDER — POLYETHYLENE GLYCOL 3350 17 GM/SCOOP PO POWD: 17.0000 g | Freq: Two times a day (BID) | ORAL | 10 refills | Status: DC | PRN

## 2021-10-14 NOTE — Assessment & Plan Note (Signed)
Stable A1c with metformin 500 mg daily and good diet changes.  Tolerating medications well.  Next A1c check in 6 months.  No changes to regimen at this time.

## 2021-10-14 NOTE — Progress Notes (Signed)
SUBJECTIVE:   CHIEF COMPLAINT / HPI:   T2DM, unintentional weight loss Currently controlled on metformin 500 mg daily.  No other medications.  Tolerating metformin well without adverse side effects.  Patient reports doing a lot better on her diet, such as cutting down on salt and sweets.  Less soda, is baking more and frying less.  She is looking to buy a [employer] soon.  She does endorse some intentional weight loss, most likely due to changes in diet.  We will continue to monitor weight loss.  Weight in clinic today stable from previous measurements.  Lab Results  Component Value Date   HGBA1C 6.7 10/14/2021   HGBA1C 6.8 04/15/2021   HGBA1C 6.8 01/12/2021   Lab Results  Component Value Date   MICROALBUR 30 04/26/2018   LDLCALC 61 01/12/2021   CREATININE 0.76 07/20/2021   Right shoulder pain Patient reports a couple weeks of worsening right shoulder pain.  Located superiorly and laterally.  No known injuries, although she does take the bus currently and does a lot of heavy carrying and lifting, such as getting her groceries home.  Reports it is worse at nighttime as she sleeps on her right quite a bit.  No identifiable aggravating or relieving factors.  She does feel as though her right arm is weaker than normal.  Pain sometimes radiates down biceps.  Also endorses intermittent numb and tingling of fingers.  No prior x-rays on file.  PERTINENT  PMH / PSH: Autoimmune hepatitis, hepatic fibrosis, HTN, T2DM, HLD  OBJECTIVE:   BP 135/87   Pulse 60   Temp 98.2 F (36.8 C)   Ht 5\' 6"  (1.676 m)   Wt 162 lb (73.5 kg)   SpO2 100%   BMI 26.15 kg/m     PHQ-9:     04/15/2021   10:54 AM 01/12/2021    3:13 PM 09/19/2020   10:45 AM  Depression screen PHQ 2/9  Decreased Interest  2 2  Down, Depressed, Hopeless 2 0 3  PHQ - 2 Score 2 2 5   Altered sleeping 2 0 3  Tired, decreased energy 3 3 3   Change in appetite 2 0 2  Feeling bad or failure about yourself  0 0 0   Trouble concentrating 0 0 0  Moving slowly or fidgety/restless 0 0 0  Suicidal thoughts 0 0 0  PHQ-9 Score 9 5 13   Difficult doing work/chores Somewhat difficult  Somewhat difficult    Physical Exam General: Awake, alert, oriented Respiratory: Speaking in full sentences, no respiratory distress Right shoulder:  Inspection and palpation unremarkable, no swelling compared to left, no gross deformities Mild TTP over proximal biceps tendon, otherwise no TTP on exam Full ROM, able to put dorsum of hand flat on back Strength 5/5 and equal bilaterally of: maintaining shoulders at 90 degrees, forearm push and pull, grip strength No pain with resisted internal rotation Moderate pain with resisted external rotation No pain with resisted pronation Moderate pain with resisted supination Negative Neer's test, Hawkins test, and cross body test Moderate pain with resisted empty can test  ASSESSMENT/PLAN:   Type 2 diabetes mellitus without complications (HCC) Stable A1c with metformin 500 mg daily and good diet changes.  Tolerating medications well.  Next A1c check in 6 months.  No changes to regimen at this time.  Acute pain of right shoulder Concern for biceps tendinitis or supraspinatus partial tear based on physical exam.  Patient medical to referral to sports medicine for  ultrasound.  We are limited in pain medicines we can use given her autoimmune hepatitis and treatment thereof.  Patient prefers to stick with 1 Tylenol 500 mg for pain relief.  Also recommend heat, ice, and gentle ROM stretches.    Ezequiel Essex, MD [place]

## 2021-10-14 NOTE — Assessment & Plan Note (Signed)
Concern for biceps tendinitis or supraspinatus partial tear based on physical exam.  Patient medical to referral to sports medicine for ultrasound.  We are limited in pain medicines we can use given her autoimmune hepatitis and treatment thereof.  Patient prefers to stick with 1 Tylenol 500 mg for pain relief.  Also recommend heat, ice, and gentle ROM stretches.

## 2021-10-21 ENCOUNTER — Ambulatory Visit (INDEPENDENT_AMBULATORY_CARE_PROVIDER_SITE_OTHER): Payer: Medicare Other | Admitting: Sports Medicine

## 2021-10-21 DIAGNOSIS — M25511 Pain in right shoulder: Secondary | ICD-10-CM

## 2021-10-21 NOTE — Progress Notes (Signed)
Established Patient Office Visit  Subjective   Patient ID: Susan Ingram, female    DOB: 1940-10-16  Age: 81 y.o. MRN: [id number]  Right shoulder pain.  Susan Ingram presents today with chief complaint of right shoulder pain that is been bothersome for the past few weeks.  She notes that her pain is worse at nighttime.  She has been trying Tylenol, topical NSAIDs with relief of her pain.  Her pain is not constant it comes and goes on different days.  She saw her primary care provider who referred her here to sports clinic.  She has not tried icing or heating the area.  Of note she is right-handed.  She denies any injury or repetitive motion activities.  She thinks maybe this could be from carrying her shopping bags from [place].  She denies any sensation of weakness, or numbness tingling down her arm.  ROS as listed above in HPI    Objective:     BP (!) 140/60   Ht 5\' 6"  (1.676 m)   Wt 162 lb (73.5 kg)   BMI 26.15 kg/m   Physical Exam Vitals reviewed.  Constitutional:      General: She is not in acute distress.    Appearance: Normal appearance. She is not ill-appearing, toxic-appearing or diaphoretic.  HENT:     Head: Normocephalic.  Pulmonary:     Effort: Pulmonary effort is normal.  Neurological:     Mental Status: She is alert.   Right shoulder: No obvious deformity or asymmetry.  No ecchymosis or edema.  No tenderness to palpation along the [facility] joint, anterior posterior shoulder.  Some slight tenderness to palpation along the lateral aspect of the shoulder.  Full range of motion forward flexion, abduction.  Symmetric external rotation and internal rotation on right and left shoulder.  Strength 4/5 abduction.  Grip strength 5/5 bilaterally.  Strength with forward flexion 5/5.  Sensation to light touch intact.  Distal radial pulses 2+ bilateral.  Positive empty can testing, pain and weakness.  Negative Hawkins negative Neer's.  MSK Complete US of Shoulder,  right  Patient was seated on exam table and shoulder US examination was performed using high frequency linear probe.   Biceps tendon- visualized within the bicipital groove in both longitudinal and transverse axis. Fluid surrounding tendon see in the middle portion of the tendon. Tendon fibers intact without signs of irregularity.  Pectoralis major tendon - intact Subscapularis tendon- visualized in both longitudinal and transverse axis with intact tendon inserting at the inferior lesser tubercle of the humerus. No signs of tear, no hypoechoic changes or tissue irregularity were seen.  Supraspinatus tendon- visualized in longitudinal, transverse, and dynamic views. No signs of tear with the tendon inserting at the superior facet of the greater tubercle of the humerus. Hypoechoic changes seen along with bony spurs and hypoechoic bursal fluid overlying tendon.  Infraspinatus tendon- visualized in both longitudinal and transverse axis with tendon insertion at the middle facet of the great tubercle of the humerus with no signs of tearing. Hypoechoic changes seen.  Posterior glenohumeral joint- visualized with proper alignment.  AC joint- visualized without bursal distension or significant bony spurs/arthritic changes.  Glenohumeral joint - no effusion, no abnormalities of visible labrum.  IMPRESSION: Tendinopathy of supraspinatus and infraspinatus. No evidence of full thickness tearing. Sub acromial bursal enlargement.Hypoechoic fluid seen surrounding biceps tendon, likely secondary to fluid from tendinopathy's as detailed above.    Assessment & Plan:   Problem List Items Addressed This Visit  Other   Acute pain of right shoulder    Based on clinical and radiographical findings I suspect patient has rotator cuff tendinopathy.  Likely acute on chronic possibly secondary to carrying heavy shopping bags.  Discussed with patient she may continue with Tylenol for pain relief and topical creams.   Patient states she cannot take NSAIDs secondary to liver function.  Recommend ice 20 minutes on 20 minutes off 1-2 times a day.  Patient was given handout with shoulder exercises, exercises reviewed in clinic today.  She verbalized understanding.  We will start with these conservative measures if patient's symptoms worsen or do not improve over the next 4 weeks patient to return to clinic for further evaluation.  At that time may consider steroid injection versus nitroglycerin patches.       Return in about 4 weeks (around 11/18/2021).    Elmore Guise, DO  Addendum:  Patient seen in the office by fellow.  Her history, exam, plan of care were precepted with me.  Karlton Lemon MD [place]

## 2021-10-21 NOTE — Assessment & Plan Note (Signed)
Based on clinical and radiographical findings I suspect patient has rotator cuff tendinopathy.  Likely acute on chronic possibly secondary to carrying heavy shopping bags.  Discussed with patient she may continue with Tylenol for pain relief and topical creams.  Patient states she cannot take NSAIDs secondary to liver function.  Recommend ice 20 minutes on 20 minutes off 1-2 times a day.  Patient was given handout with shoulder exercises, exercises reviewed in clinic today.  She verbalized understanding.  We will start with these conservative measures if patient's symptoms worsen or do not improve over the next 4 weeks patient to return to clinic for further evaluation.  At that time may consider steroid injection versus nitroglycerin patches.

## 2021-10-22 ENCOUNTER — Encounter: Payer: Self-pay | Admitting: Sports Medicine

## 2021-11-09 ENCOUNTER — Other Ambulatory Visit: Payer: Self-pay

## 2021-11-09 DIAGNOSIS — E118 Type 2 diabetes mellitus with unspecified complications: Secondary | ICD-10-CM

## 2021-11-10 MED ORDER — ATORVASTATIN CALCIUM 40 MG PO TABS: 40.0000 mg | ORAL_TABLET | Freq: Every day | ORAL | 3 refills | Status: DC

## 2021-12-02 ENCOUNTER — Ambulatory Visit (INDEPENDENT_AMBULATORY_CARE_PROVIDER_SITE_OTHER): Payer: Medicare Other | Admitting: Family Medicine

## 2021-12-02 ENCOUNTER — Encounter: Payer: Self-pay | Admitting: Family Medicine

## 2021-12-02 VITALS — BP 138/78 | HR 58 | Ht 66.73 in | Wt 164.6 lb

## 2021-12-02 DIAGNOSIS — J45909 Unspecified asthma, uncomplicated: Secondary | ICD-10-CM | POA: Diagnosis not present

## 2021-12-02 DIAGNOSIS — Z0000 Encounter for general adult medical examination without abnormal findings: Secondary | ICD-10-CM | POA: Diagnosis not present

## 2021-12-02 DIAGNOSIS — J309 Allergic rhinitis, unspecified: Secondary | ICD-10-CM | POA: Diagnosis not present

## 2021-12-02 HISTORY — DX: Encounter for general adult medical examination without abnormal findings: Z00.00

## 2021-12-02 MED ORDER — ALBUTEROL SULFATE HFA 108 (90 BASE) MCG/ACT IN AERS: 2.0000 | INHALATION_SPRAY | Freq: Four times a day (QID) | RESPIRATORY_TRACT | 2 refills | Status: DC | PRN

## 2021-12-02 MED ORDER — FLUTICASONE PROPIONATE 50 MCG/ACT NA SUSP: NASAL | 11 refills | Status: DC

## 2021-12-02 NOTE — Patient Instructions (Signed)
You spoke to Ezequiel Essex, MD over the phone for your annual wellness visit.  Your vaccine record showed you got the COVID vaccine, annual flu vaccine, and new RSV vaccine at the pharmacy on 11/10/21.   You are DONE with colonoscopies! We usually stop around 82 years old. You have "graduated" and no longer need colonoscopies.   I have given you information on advanced directives, living wills, and health care power of attorney for you to review.   We discussed goals:   Goals      Exercise 3x per week (15 minutes per time)     Short walks outside with the nicer weather ~15 minutes       We also discussed recommended health maintenance. Please call our office and schedule a visit. As discussed, you are due for: Health Maintenance  Topic Date Due   HEMOGLOBIN A1C  04/15/2022   Diabetic kidney evaluation - Urine ACR  04/16/2022   OPHTHALMOLOGY EXAM  07/02/2022   Diabetic kidney evaluation - GFR measurement  07/21/2022   TETANUS/TDAP  03/25/2027   Pneumonia Vaccine 41+ Years old  Completed   DEXA SCAN  Completed   Zoster Vaccines- Shingrix  Completed   Our clinic's number is [phone]. Please call with questions or concerns about what we discussed today.

## 2021-12-02 NOTE — Progress Notes (Signed)
Subjective:   Susan Ingram is a 81 y.o. female who presents for Medicare Annual (Subsequent) preventive examination.  Method of visit: in person  Encounter participants: Patient: Susan Ingram - located at [facility] Nurse/Provider: Ezequiel Essex - located at [facility] Others (if applicable): n/a  Review of Systems:  Cardiac Risk Factors include: advanced age (>31men, >35 women);diabetes mellitus;hypertension     Objective:    Vitals: BP 138/78   Pulse (!) 58   Ht 5' 6.73" (1.695 m)   Wt 164 lb 9.6 oz (74.7 kg)   BMI 25.99 kg/m   Body mass index is 25.99 kg/m.     12/02/2021   11:16 AM 07/20/2021   11:42 AM 07/07/2021   11:44 AM 04/15/2021   10:54 AM 09/19/2020   10:34 AM 08/21/2020    4:03 PM 07/09/2020    9:34 AM  Advanced Directives  Does Patient Have a Medical Advance Directive? No No No No No No No  Would patient like information on creating a medical advance directive? Yes (MAU/Ambulatory/Procedural Areas - Information given) No - Patient declined No - Patient declined No - Patient declined No - Patient declined No - Patient declined No - Patient declined   Tobacco Social History   Tobacco Use  Smoking Status Former   Packs/day: 2.00   Years: 30.00   Total pack years: 60.00   Types: Cigarettes   Quit date: 04/15/1993   Years since quitting: 28.6  Smokeless Tobacco Never     Counseling given: Not Answered   Clinical Intake:  Pre-visit preparation completed: No  Pain : No/denies pain Pain Score: 0-No pain     BMI - recorded: 26.5 Nutritional Status: BMI 25 -29 Overweight Diabetes: Yes CBG done?: No Did pt. bring in CBG monitor from home?: No  How often do you need to have someone help you when you read instructions, pamphlets, or other written materials from your doctor or pharmacy?: 1 - Never What is the last grade level you completed in school?: 12  Interpreter Needed?: No     Past Medical History:  Diagnosis Date   Arthralgia of right  temporomandibular joint 10/10/2012   Arthritis    RIGHT KNEE   Asthma    Autoimmune hepatitis ([place]) 07/28/2021   Cancer ([place])    lung   Cervical stenosis of spinal canal 09/03/2015   MRI 08/2015: Degenerative changes resulting in moderate canal stenosis C3-4, mild to moderate at C4-5 and C5-6.   Neural foraminal narrowing C3-4 thru C6-7, moderate at multiple levels.   Diabetes ([place]) 02/28/2018   Dizzy spells 08/11/2013   Eczema    Eustachian tube dysfunction 07/23/2016   Heartburn 0/07/2692   Helicobacter pylori gastritis 10/08/2014   History of lung cancer 11/24/2010   S/P Bronchoscopy, left VATS, wedge resection,lingular segmentectomy, lymph node dissection 05/23/2008, [place]. Recently seen by [facility] on 10/02/13.  Negative CT scan. Patient now cured.    History of lung cancer 11/24/2010   S/P Bronchoscopy, left VATS, wedge resection,lingular segmentectomy, lymph node dissection 05/23/2008, [place]. Recently seen by [facility] on 10/02/13.  Negative CT scan. Patient now cured.    History of vitamin D deficiency 02/18/2012   Hypercholesterolemia    Hypertension    Hypertension associated with diabetes ([place])    Hypokalemia 04/10/2020   Otalgia of both ears 01/03/2015   Retina disorder, left 04/22/2015   Seborrheic keratosis 09/03/2015   Throat clearing 09/15/2013   Past Surgical History:  Procedure Laterality Date   Bronchoscopy, left video-assisted  thoracoscopy, wedge  05/23/2008   Left thyroid lobectomy, frozen section.  10/11/2002   VAGINAL HYSTERECTOMY  1966   in her 20's    WRIST SURGERY Left 02/15/2017   For carpal tunnel? 2019 is estimate   Family History  Problem Relation Age of Onset   Early death Mother    Hypertension Mother    Stroke Mother    Alzheimer's disease Father    Diabetes Brother    Breast cancer Maternal Aunt    Breast cancer Cousin    Colon cancer Neg Hx    Social History   Socioeconomic History   Marital status: Divorced    Spouse name: Not on file    Number of children: 1   Years of education: 12   Highest education level: 12th grade  Occupational History   Occupation: Retired  Tobacco Use   Smoking status: Former    Packs/day: 2.00    Years: 30.00    Total pack years: 60.00    Types: Cigarettes    Quit date: 04/15/1993    Years since quitting: 28.6   Smokeless tobacco: Never  Vaping Use   Vaping Use: Never used  Substance and Sexual Activity   Alcohol use: No   Drug use: No   Sexual activity: Not Currently  Other Topics Concern   Not on file  Social History Narrative   Patient lives alone in [place].   Patient has one daughter and 7 grandchildren.    Patient typically rides the bus for transportation, however her grandchildren will help her get around as needed.   Patient enjoys watching westerns on TV, reading the paper and reading books.    Patient does not have an active work out plan, however she stays "on the move all the time."    Social Determinants of Health   Financial Resource Strain: Low Risk  (05/20/2020)   Overall Financial Resource Strain (CARDIA)    Difficulty of Paying Living Expenses: Not hard at all  Food Insecurity: No Food Insecurity (05/20/2020)   Hunger Vital Sign    Worried About Running Out of Food in the Last Year: Never true    [place] in the Last Year: Never true  Transportation Needs: No Transportation Needs (05/20/2020)   PRAPARE - Hydrologist (Medical): No    Lack of Transportation (Non-Medical): No  Physical Activity: Inactive (05/20/2020)   Exercise Vital Sign    Days of Exercise per Week: 0 days    Minutes of Exercise per Session: 0 min  Stress: Stress Concern Present (05/20/2020)   [place]    Feeling of Stress : To some extent  Social Connections: Socially Isolated (05/20/2020)   Social Connection and Isolation Panel [NHANES]    Frequency of Communication with Friends and Family:  More than three times a week    Frequency of Social Gatherings with Friends and Family: More than three times a week    Attends Religious Services: Never    Marine scientist or Organizations: No    Attends Archivist Meetings: Never    Marital Status: Divorced    Outpatient Encounter Medications as of 12/02/2021  Medication Sig   albuterol (PROAIR HFA) 108 (90 Base) MCG/ACT inhaler Inhale 2 puffs into the lungs every 6 (six) hours as needed for wheezing or shortness of breath.   atorvastatin (LIPITOR) 40 MG tablet Take 1 tablet (40 mg total) by  mouth daily.   chlorthalidone (HYGROTON) 25 MG tablet Take 1 tablet by mouth once daily   EQ ALLERGY RELIEF, CETIRIZINE, 10 MG tablet Take 1/2 (one-half) tablet by mouth once daily   fluticasone (FLONASE) 50 MCG/ACT nasal spray Use 2 spray(s) in each nostril once daily   lidocaine (XYLOCAINE) 2 % jelly Apply 1 application. topically as needed.   metFORMIN (GLUCOPHAGE) 500 MG tablet Take 1 tablet (500 mg total) by mouth daily.   polyethylene glycol powder (GLYCOLAX/MIRALAX) 17 GM/SCOOP powder Take 17 g by mouth 2 (two) times daily as needed.   benzonatate (TESSALON) 100 MG capsule Take 1 capsule (100 mg total) by mouth 2 (two) times daily as needed for cough. (Patient not taking: Reported on 12/02/2021)   [DISCONTINUED] albuterol (PROAIR HFA) 108 (90 Base) MCG/ACT inhaler Inhale 2 puffs into the lungs every 6 (six) hours as needed for wheezing or shortness of breath.   [DISCONTINUED] aspirin EC 81 MG tablet Take 81 mg by mouth daily. Swallow whole.   [DISCONTINUED] fluticasone (FLONASE) 50 MCG/ACT nasal spray Use 2 spray(s) in each nostril once daily   [DISCONTINUED] potassium chloride (K-DUR) 10 MEQ tablet Take 1 tablet (10 mEq total) by mouth daily.   No facility-administered encounter medications on file as of 12/02/2021.    Activities of Daily Living    12/02/2021   10:28 AM 07/20/2021   12:06 PM  In your present state of  health, do you have any difficulty performing the following activities:  Hearing? 0 0  Vision? 1 0  Comment wears prescription glasses   Difficulty concentrating or making decisions? 1 0  Walking or climbing stairs? 0 0  Dressing or bathing? 0 0  Doing errands, shopping? 0   Preparing Food and eating ? N   Using the Toilet? N   In the past six months, have you accidently leaked urine? N   Do you have problems with loss of bowel control? N   Managing your Medications? N   Managing your Finances? N   Housekeeping or managing your Housekeeping? N     Patient Care Team: Ezequiel Essex, MD as PCP - General (Family Medicine) Melrose Nakayama, MD (Cardiothoracic Surgery) Marijean Bravo, MD (Urgent Care) [place], Ronalee Red, PA-C as Physician Assistant (Dermatology) Kennith Center, RD as Dietitian (Family Medicine) Gatha Mayer, MD as Consulting Physician (Gastroenterology) Roosevelt Locks, CRNP as Nurse Practitioner (Nurse Practitioner) Freddi Starr, MD as Consulting Physician (Pulmonary Disease)    Assessment:   This is a routine wellness examination for Susan Ingram.  Exercise Activities and Dietary recommendations Current Exercise Habits: Home exercise routine, Type of exercise: walking, Time (Minutes): > 60 (Walks around grocery stores about 4 hours at a time 4 days/wk), Frequency (Times/Week): 4, Weekly Exercise (Minutes/Week): 0, Intensity: Mild   Goals      Exercise 3x per week (15 minutes per time)     Short walks outside with the nicer weather ~15 minutes     Exercise 3x per week (30 min per time)     Go to the [facility] for some classes.        Fall Risk    12/02/2021   10:28 AM 04/15/2021   10:54 AM 09/19/2020   10:40 AM 08/21/2020    4:03 PM 07/09/2020    9:34 AM  Fall Risk   Falls in the past year? 0 0 0 0 0  Number falls in past yr: 0 0 0 0 0  Injury with Fall? 0  0  0 0  Risk for fall due to : No Fall Risks       Is the patient's home free of loose throw  rugs in walkways, pet beds, electrical cords, etc?   yes      Adequate lighting?   yes  Depression Screen    12/02/2021   10:06 AM 04/15/2021   10:54 AM 01/12/2021    3:13 PM 09/19/2020   10:45 AM  PHQ 2/9 Scores  PHQ - 2 Score 0 2 2 5   PHQ- 9 Score 9 9 5 13     Cognitive Function      04/09/2019   10:26 AM  Montreal Cognitive Assessment   Visuospatial/ Executive (0/5) 1  Naming (0/3) 3  Attention: Read list of digits (0/2) 2  Attention: Read list of letters (0/1) 1  Attention: Serial 7 subtraction starting at 100 (0/3) 0  Language: Repeat phrase (0/2) 0  Language : Fluency (0/1) 0  Abstraction (0/2) 0  Delayed Recall (0/5) 0  Orientation (0/6) 6  Total 13  Adjusted Score (based on education) 14      12/02/2021   10:33 AM 05/20/2020    9:09 AM  6CIT Screen  What Year? 0 points 0 points  What month? 0 points 0 points  What time? 0 points 0 points  Count back from 20 0 points 4 points  Months in reverse 4 points 2 points  Repeat phrase 0 points 0 points  Total Score 4 points 6 points   Immunization History  Administered Date(s) Administered   Fluad Quad(high Dose 65+) 12/24/2019   Hepb-cpg 08/03/2021, 09/04/2021   Influenza Split 02/18/2012   Influenza,inj,Quad PF,6+ Mos 12/27/2012, 12/12/2013, 12/09/2014, 12/01/2015, 11/16/2016, 11/10/2017, 10/25/2018, 11/10/2020   Influenza-Unspecified 11/10/2021   Moderna Sars-Covid-2 Vaccination 03/31/2019, 04/28/2019, 01/15/2020   Pfizer Covid-19 Vaccine Bivalent Booster 5yrs & up 01/21/2021   Pneumococcal Conjugate-13 01/02/2014   Pneumococcal Polysaccharide-23 07/15/2016   Tdap 03/24/2017   Unspecified SARS-COV-2 Vaccination 11/10/2021   Zoster Recombinat (Shingrix) 06/13/2017, 08/11/2017   Screening Tests Health Maintenance  Topic Date Due   FOOT EXAM  01/05/2020   COVID-19 Vaccine (6 - Moderna risk series) 01/05/2022   HEMOGLOBIN A1C  04/15/2022   Diabetic kidney evaluation - Urine ACR  04/16/2022   OPHTHALMOLOGY  EXAM  07/02/2022   Diabetic kidney evaluation - GFR measurement  07/21/2022   TETANUS/TDAP  03/25/2027   Pneumonia Vaccine 30+ Years old  Completed   INFLUENZA VACCINE  Completed   DEXA SCAN  Completed   Zoster Vaccines- Shingrix  Completed   HPV VACCINES  Aged Out   Cancer Screenings: Lung: Low Dose CT Chest recommended if Age 50-80 years, 20 pack-year currently smoking OR have quit w/in 15years. Patient does not qualify. Breast:  Up to date on Mammogram? Yes, last 2023 Up to date of Bone Density/Dexa? Yes, last 2016 Colorectal: UTD, last 2016     Plan:   I have personally reviewed and noted the following in the patient's chart:   Medical and social history Use of alcohol, tobacco or illicit drugs  Current medications and supplements Functional ability and status Nutritional status Physical activity Advanced directives List of other physicians Hospitalizations, surgeries, and ER visits in previous 12 months Vitals Screenings to include cognitive, depression, and falls Referrals and appointments  In addition, I have reviewed and discussed with patient certain preventive protocols, quality metrics, and best practice recommendations. A written personalized care plan for preventive services as well as general preventive health  recommendations were provided to patient.    Ezequiel Essex, MD  12/02/2021

## 2021-12-15 ENCOUNTER — Other Ambulatory Visit: Payer: Self-pay | Admitting: Family Medicine

## 2021-12-15 DIAGNOSIS — I10 Essential (primary) hypertension: Secondary | ICD-10-CM

## 2021-12-15 DIAGNOSIS — J309 Allergic rhinitis, unspecified: Secondary | ICD-10-CM

## 2022-02-17 ENCOUNTER — Encounter: Payer: Self-pay | Admitting: Family Medicine

## 2022-02-17 ENCOUNTER — Ambulatory Visit (INDEPENDENT_AMBULATORY_CARE_PROVIDER_SITE_OTHER): Payer: Medicare Other | Admitting: Family Medicine

## 2022-02-17 VITALS — BP 150/111 | HR 86 | Temp 98.4°F | Ht 66.0 in | Wt 159.0 lb

## 2022-02-17 DIAGNOSIS — J029 Acute pharyngitis, unspecified: Secondary | ICD-10-CM | POA: Insufficient documentation

## 2022-02-17 HISTORY — DX: Acute pharyngitis, unspecified: J02.9

## 2022-02-17 NOTE — Assessment & Plan Note (Signed)
Patient's history and symptoms consistent with viral URI. Patient does not have signs of dehydration, or increased work of breathing, or fevers suggesting more serious bacterial infection or pneumonia. Discussed with patient treatment supportive care.  -Patient may increase Flonase to twice daily -Patient may do short course of Afrin for 3 days -Patient may use Robitussin for cough -Patient may use reduced dose Tylenol such as Tylenol 3 in light of her liver history -Discussed continued hydration and strict return precautions given

## 2022-02-17 NOTE — Patient Instructions (Addendum)
It was great to see you! Thank you for allowing me to participate in your care!  I recommend that you always bring your medications to each appointment as this makes it easy to ensure we are on the correct medications and helps [place] not miss when refills are needed.  Our plans for today:  -You may use Flonase up to 2 times daily. -You may use over-the-counter cough syrup as needed for your cough. -Please try setting up this may reduce the amount of drainage can cause to cough. -You may try Afrin for 3 days to clear up your sinuses.     Take care and seek immediate care sooner if you develop any concerns.   Dr. Salvadore Oxford, MD [place]

## 2022-02-17 NOTE — Progress Notes (Signed)
    SUBJECTIVE:   CHIEF COMPLAINT / HPI: flu like symptoms  2 day history cough runny nose. Worse at night. No fevers. Some chills. Has not been around anyone is sick that she knows up. No nausea or vomiting. Appetite decreased, but still decreased. Drinking juice and water regularly. Some diarrhea. No constipation. Theraflu at home. Main issues is that she has been unable to sleep.   Allergic rhinitis, takes Cetrizine and Flonase daily.  Autoimmune hepatitis - Last appointment with Hepatology 09/02/21. Biannual ultrasound surveillance.  Asthma - Albuterol. Denies having any difficulty breathing.  PERTINENT  PMH / PSH: Depression, GAD, Renal mass, HTN, Asthma, T2DM, Autoimmune Hepatitis, Hx of Lung cancer  OBJECTIVE:   BP (!) 150/111   Pulse 86   Temp 98.4 F (36.9 C)   Ht 5\' 6"  (1.676 m)   Wt 159 lb (72.1 kg)   SpO2 100%   BMI 25.66 kg/m   General: NAD  HEENT: No obvious oropharyngeal erythema, bilateral nasal turbinates inflamed, no obvious rhinorrhea Cardiovascular: RRR, no murmurs, no peripheral edema Respiratory: normal WOB on RA, CTAB, no wheezes, ronchi or rales Extremities: Moving all 4 extremities equally   ASSESSMENT/PLAN:   Acute pharyngitis Patient's history and symptoms consistent with viral URI. Patient does not have signs of dehydration, or increased work of breathing, or fevers suggesting more serious bacterial infection or pneumonia. Discussed with patient treatment supportive care.  -Patient may increase Flonase to twice daily -Patient may do short course of Afrin for 3 days -Patient may use Robitussin for cough -Patient may use reduced dose Tylenol such as Tylenol 3 in light of her liver history -Discussed continued hydration and strict return precautions given     Salvadore Oxford, MD [place]

## 2022-02-25 DIAGNOSIS — H04123 Dry eye syndrome of bilateral lacrimal glands: Secondary | ICD-10-CM | POA: Diagnosis not present

## 2022-02-25 DIAGNOSIS — E119 Type 2 diabetes mellitus without complications: Secondary | ICD-10-CM | POA: Diagnosis not present

## 2022-02-25 DIAGNOSIS — Z961 Presence of intraocular lens: Secondary | ICD-10-CM | POA: Diagnosis not present

## 2022-03-08 ENCOUNTER — Other Ambulatory Visit: Payer: Self-pay | Admitting: Family Medicine

## 2022-03-08 DIAGNOSIS — I10 Essential (primary) hypertension: Secondary | ICD-10-CM

## 2022-04-26 DIAGNOSIS — K7400 Hepatic fibrosis, unspecified: Secondary | ICD-10-CM | POA: Diagnosis not present

## 2022-04-26 DIAGNOSIS — K754 Autoimmune hepatitis: Secondary | ICD-10-CM | POA: Diagnosis not present

## 2022-04-27 ENCOUNTER — Other Ambulatory Visit: Payer: Self-pay

## 2022-04-27 DIAGNOSIS — K7400 Hepatic fibrosis, unspecified: Secondary | ICD-10-CM

## 2022-04-28 ENCOUNTER — Other Ambulatory Visit: Payer: Self-pay | Admitting: Nurse Practitioner

## 2022-04-28 DIAGNOSIS — K7400 Hepatic fibrosis, unspecified: Secondary | ICD-10-CM

## 2022-05-25 ENCOUNTER — Ambulatory Visit
Admission: RE | Admit: 2022-05-25 | Discharge: 2022-05-25 | Disposition: A | Discharge status: Home / Self Care | Payer: 59 | Source: Ambulatory Visit | Attending: Nurse Practitioner | Admitting: Nurse Practitioner

## 2022-05-25 DIAGNOSIS — K7400 Hepatic fibrosis, unspecified: Secondary | ICD-10-CM

## 2022-05-28 ENCOUNTER — Ambulatory Visit (INDEPENDENT_AMBULATORY_CARE_PROVIDER_SITE_OTHER): Payer: 59 | Admitting: Family Medicine

## 2022-05-28 ENCOUNTER — Encounter: Payer: Self-pay | Admitting: Family Medicine

## 2022-05-28 VITALS — BP 149/75 | HR 68 | Ht 66.0 in | Wt 160.8 lb

## 2022-05-28 DIAGNOSIS — I10 Essential (primary) hypertension: Secondary | ICD-10-CM

## 2022-05-28 DIAGNOSIS — E119 Type 2 diabetes mellitus without complications: Secondary | ICD-10-CM

## 2022-05-28 LAB — POCT GLYCOSYLATED HEMOGLOBIN (HGB A1C): HbA1c, POC (controlled diabetic range): 6.9 % (ref 0.0–7.0)

## 2022-05-28 IMAGING — CT CT CHEST W/O CM
2 of 5 series · 15 of 36 positions shown, 18 images · non-contrast
Comparison: June 29, 2018.

CLINICAL DATA: History of lung cancer.  Lung nodule.

EXAM:
CT CHEST WITHOUT CONTRAST
TECHNIQUE: Multidetector CT imaging of the chest was performed following the
standard protocol without IV contrast.

[Series 4: chest 2.00 br40 s3 · coronal · 0.59mm/px · 3 of 144 slices shown]
[im 29/144  lung]
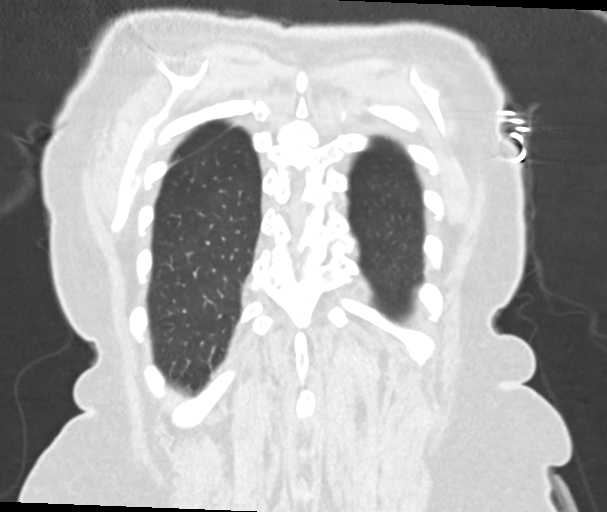
[im 58/144  lung]
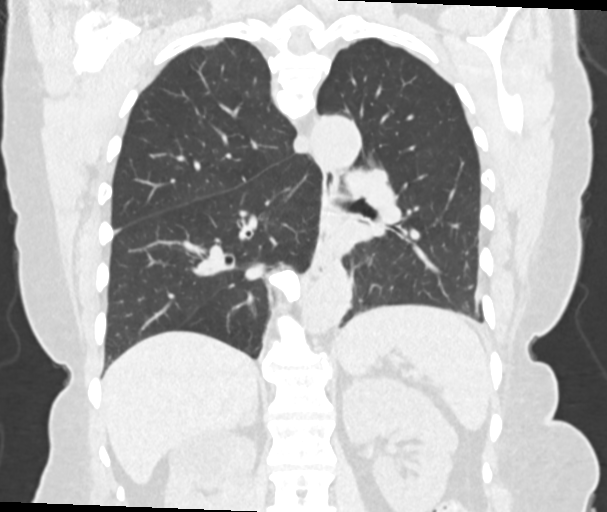
[im 86/144  lung]
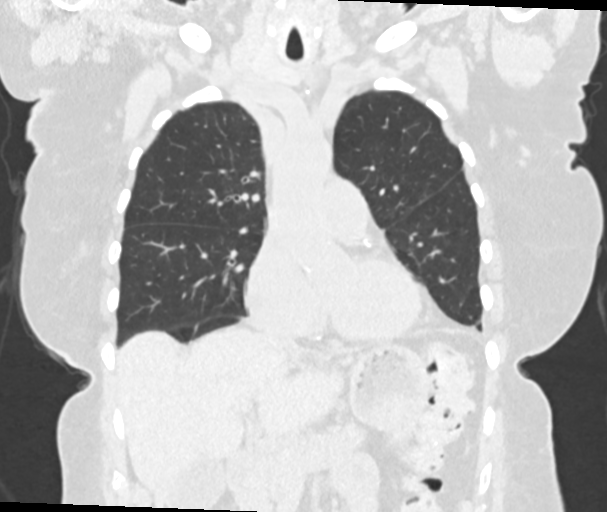

[Series 10: chest 1.00 br40 s3 super d · axial · 0.71mm/px · z∈[+1550,+1811]mm · 12 of 378 slices shown, 15 images]
[im 26/378  mediastinal]
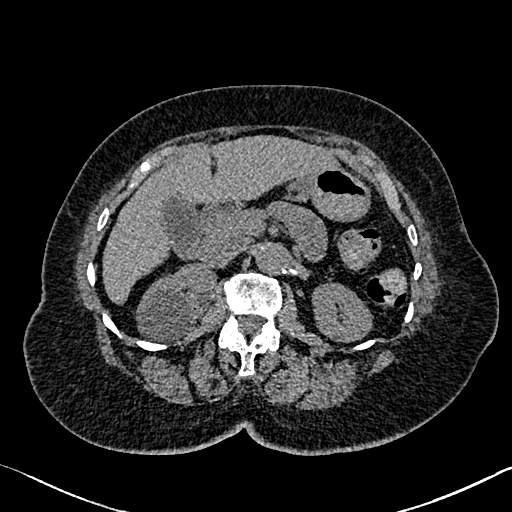
[im 26/378  lung]
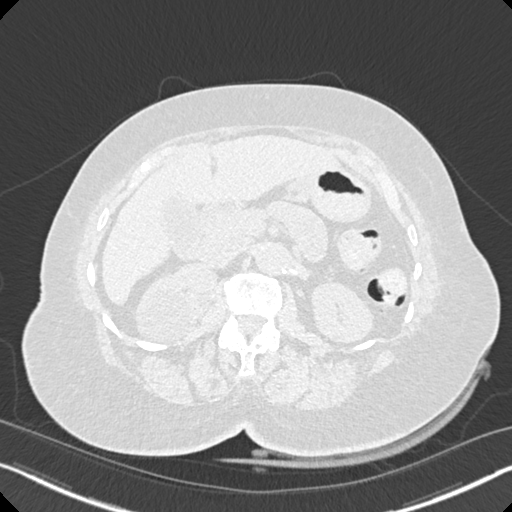
[im 51/378  lung]
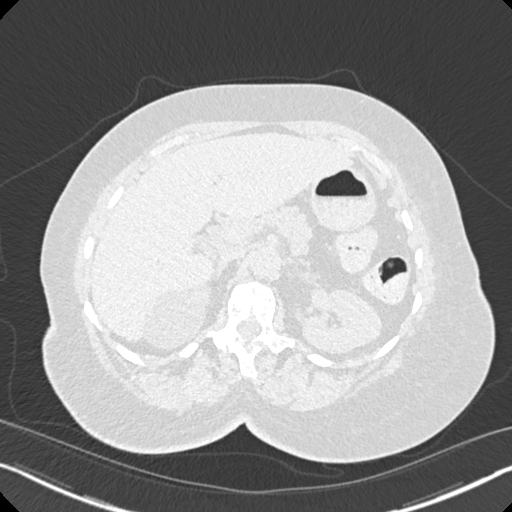
[im 76/378  lung]
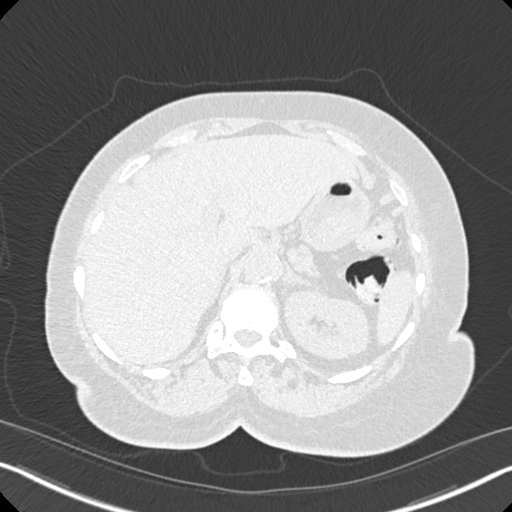
[im 126/378  lung]
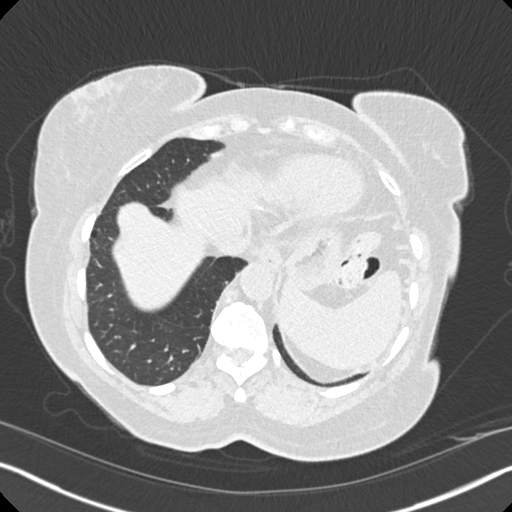
[im 151/378  mediastinal]
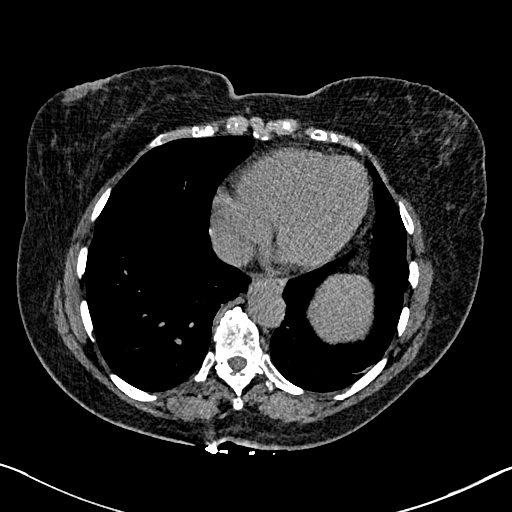
[im 151/378  lung]
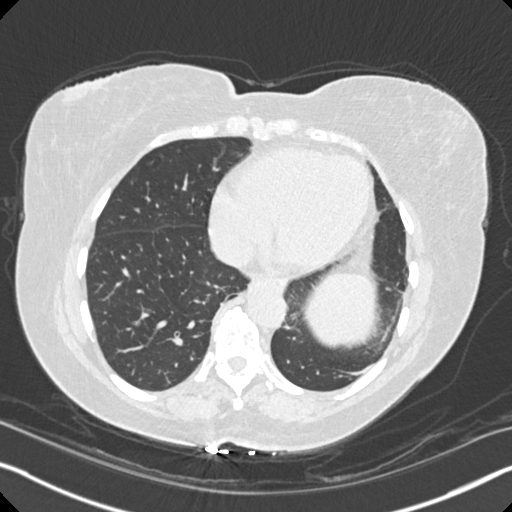
[im 176/378  lung]
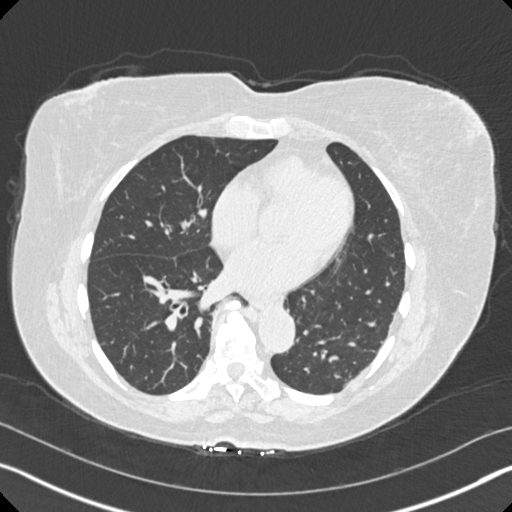
[im 202/378  lung]
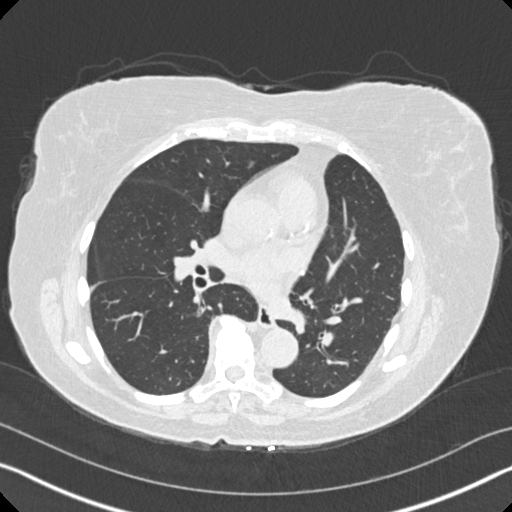
[im 227/378  lung]
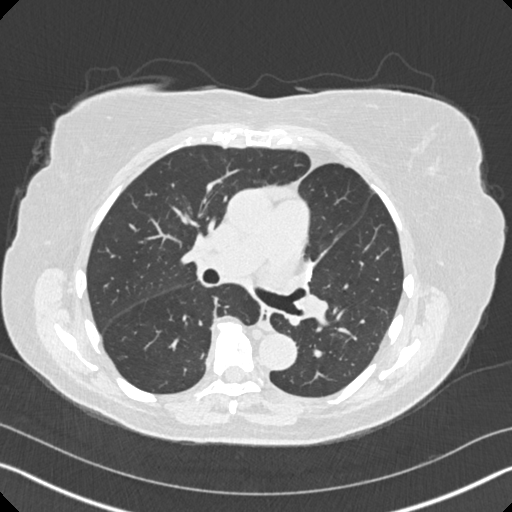
[im 252/378  mediastinal]
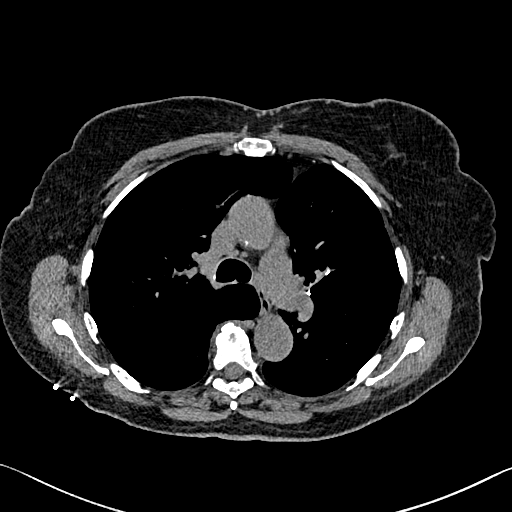
[im 252/378  lung]
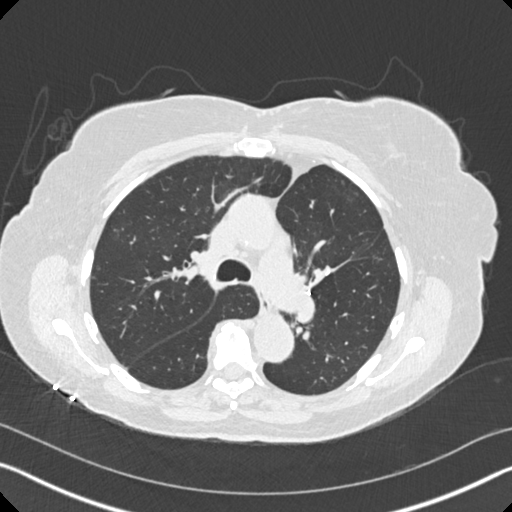
[im 302/378  lung]
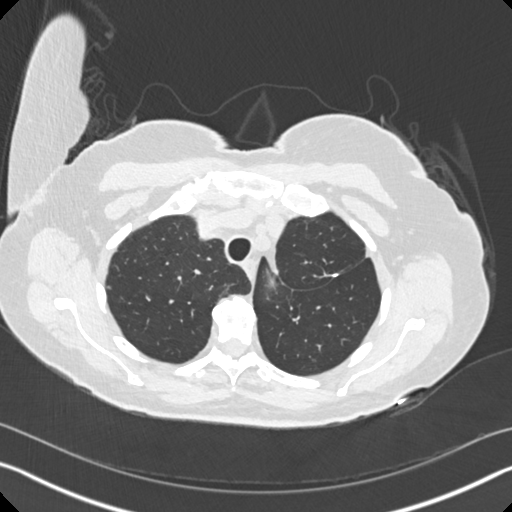
[im 327/378  lung]
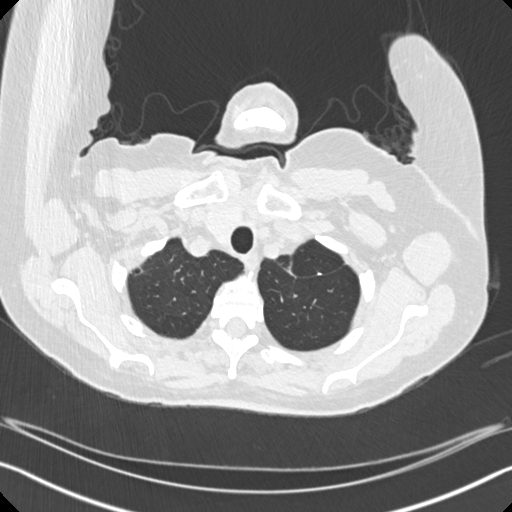
[im 352/378  lung]
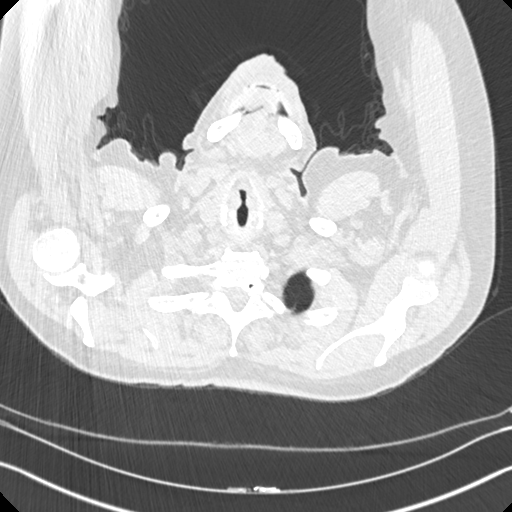

[15 of 36 positions shown; findings below may reference images not displayed]

FINDINGS: Cardiovascular: Atherosclerosis of thoracic aorta is noted without
aneurysm formation. Normal cardiac size. No pericardial effusion.

Mediastinum/Nodes: No enlarged mediastinal or axillary lymph nodes.
Status post left thyroidectomy. Trachea and esophagus demonstrate no
significant findings.

Lungs/Pleura: No pneumothorax or pleural effusion is noted. Stable
postsurgical changes seen consistent with prior left upper lobe
wedge resection. Stable 4 mm nodule is noted in right lower lobe
best seen on image number 92 of series 8, which can be considered
benign at this point. No other significant pulmonary abnormality is
noted

Upper Abdomen: No acute abnormality.

Musculoskeletal: No chest wall mass or suspicious bone lesions
identified.
IMPRESSION: Stable postsurgical changes seen in left upper lobe. Stable right
lower lobe nodule is noted which can be considered benign at this
point with no further follow-up required. No significant pulmonary
abnormality is noted.

Aortic Atherosclerosis ([id number]).

## 2022-05-28 NOTE — Assessment & Plan Note (Signed)
A1c 6.9, at goal for age.  Tolerating metformin 500 mg daily and atorvastatin 40 mg daily well, no adverse side effects.  Foot exam today reassuring, sensation intact.  Collected urine sample for urine microalbumin screen.  Depending on results, could discuss SGLT2 medication, would favor combination pill with metformin.

## 2022-05-28 NOTE — Assessment & Plan Note (Signed)
Long history of controlled with chlorthalidone 25 mg.  Currently tolerating well without adverse side effects.  BP elevated today, however I would argue acceptable based on age given diastolic pressure of 75.  Last BMP 04/26/2022 showed creatinine 0.81 and potassium 3.7. Would check approx 2-3 times per year.

## 2022-05-28 NOTE — Progress Notes (Signed)
    SUBJECTIVE:   CHIEF COMPLAINT / HPI:   T2DM Medications: Metformin 500 mg daily, atorvastatin 40 mg daily, tolerating well without adverse s/e Due for foot exam and urine microalbumin screen  Lab Results  Component Value Date   HGBA1C 6.9 05/28/2022   HGBA1C 6.7 10/14/2021   HGBA1C 6.8 04/15/2021   Lab Results  Component Value Date   MICROALBUR 30 04/26/2018   LDLCALC 61 01/12/2021   CREATININE 0.76 07/20/2021   HTN Long history of control with chlorthalidone 25 mg Tolerating well without adverse s/e  BP Readings from Last 3 Encounters:  05/28/22 (!) 149/75  02/17/22 (!) 150/111  12/02/21 138/78    PERTINENT  PMH / PSH: Autoimmune hepatitis with cirrhosis and fibrosis, cholelithiasis, hepatic steatosis, T2DM, HTN  OBJECTIVE:   BP (!) 149/75   Pulse 68   Ht 5\' 6"  (1.676 m)   Wt 160 lb 12.8 oz (72.9 kg)   SpO2 100%   BMI 25.95 kg/m    Gen: Awake, alert, NAD Cardiac: Regular rate and rhythm, no murmurs Respiratory: CTAB anteriorly Extremities: Foot exam unremarkable, intact sensation at all points, palpable dorsalis and pretibial pulses bilaterally, feet warm to palpation  ASSESSMENT/PLAN:   Type 2 diabetes mellitus without complications (HCC) A1c 6.9, at goal for age.  Tolerating metformin 500 mg daily and atorvastatin 40 mg daily well, no adverse side effects.  Foot exam today reassuring, sensation intact.  Collected urine sample for urine microalbumin screen.  Depending on results, could discuss SGLT2 medication, would favor combination pill with metformin.  Hypertension Long history of controlled with chlorthalidone 25 mg.  Currently tolerating well without adverse side effects.  BP elevated today, however I would argue acceptable based on age given diastolic pressure of 75.  Last BMP 04/26/2022 showed creatinine 0.81 and potassium 3.7. Would check approx 2-3 times per year.      Fayette Pho, MD [facility]

## 2022-05-28 NOTE — Patient Instructions (Signed)
It was wonderful to see you today. Thank you for allowing me to be a part of your care. Below is a short summary of what we discussed at your visit today:  Diabetes A1c today was 6.9 ,which is still at our goal of under 8!   Keep taking the metformin.   Today we got a urine sample to see if you have any kidney damage from the diabetes. If the results are normal, I will send you a letter or MyChart message. If the results are abnormal, I will give you a call.      Please bring all of your medications to every appointment!  If you have any questions or concerns, please do not hesitate to contact us via phone or MyChart message.   Fayette Pho, MD

## 2022-06-01 ENCOUNTER — Other Ambulatory Visit: Payer: Self-pay | Admitting: Family Medicine

## 2022-06-01 ENCOUNTER — Telehealth: Payer: Self-pay | Admitting: Family Medicine

## 2022-06-01 DIAGNOSIS — E119 Type 2 diabetes mellitus without complications: Secondary | ICD-10-CM

## 2022-06-01 DIAGNOSIS — J309 Allergic rhinitis, unspecified: Secondary | ICD-10-CM

## 2022-06-01 LAB — MICROALBUMIN / CREATININE URINE RATIO
Creatinine, Urine: 115.7 mg/dL
Microalb/Creat Ratio: 3 mg/g creat (ref 0–29)
Microalbumin, Urine: 3.1 ug/mL

## 2022-06-01 MED ORDER — METFORMIN HCL 500 MG PO TABS: 500.0000 mg | ORAL_TABLET | Freq: Every day | ORAL | 3 refills | Status: DC

## 2022-06-01 NOTE — Telephone Encounter (Signed)
Called patient to discuss results. Since microalbuminuria is at a low amount, do not see need for SGLT2 at this time. Will continue metformin monotherapy.   Could consider metformin-SGLT2 combo pills in future if need arises.   Fayette Pho, MD

## 2022-06-23 ENCOUNTER — Other Ambulatory Visit: Payer: Self-pay | Admitting: Family Medicine

## 2022-06-23 DIAGNOSIS — Z1231 Encounter for screening mammogram for malignant neoplasm of breast: Secondary | ICD-10-CM

## 2022-07-01 ENCOUNTER — Encounter: Payer: Self-pay | Admitting: Family Medicine

## 2022-07-01 ENCOUNTER — Ambulatory Visit (INDEPENDENT_AMBULATORY_CARE_PROVIDER_SITE_OTHER): Payer: 59 | Admitting: Family Medicine

## 2022-07-01 VITALS — BP 141/75 | HR 97 | Ht 66.0 in | Wt 160.4 lb

## 2022-07-01 DIAGNOSIS — M542 Cervicalgia: Secondary | ICD-10-CM

## 2022-07-01 NOTE — Patient Instructions (Addendum)
It was great seeing you today!  Today we discussed your neck pain, I believe you strain this while you were sleeping.   Apply volatren gel or bengay to the area.   You are very tight in that area which is probably what is causing you pain, please do the attached stretching exercises below to feel better sooner. Try to do this at least twice a day. If any of these stretches causes you pain, then please do not do that particular stretch.  Please follow up at your next scheduled appointment, if anything arises between now and then, please don't hesitate to contact our office.   Thank you for allowing [place] to be a part of your medical care!  Thank you, Dr. Robyne Peers

## 2022-07-01 NOTE — Progress Notes (Signed)
    SUBJECTIVE:   CHIEF COMPLAINT / HPI:   Patient presents with concern of neck pain that started the day before yesterday. She noticed it when she woke up, says that it hurts the most on her left side. She has used lidocaine and this did not help. Tylenol did not help either. Pain seems to be constant, last night she tried to move her head but it hurts when she moves in certain directions. Denies numbness or tingling. Denies radiating pain.  Denies vision changes or dizziness. She reports having high pillows, has not used any new pillows. Denies any other symptoms.  OBJECTIVE:   BP (!) 141/75   Pulse 97   Ht 5\' 6"  (1.676 m)   Wt 160 lb 6.4 oz (72.8 kg)   SpO2 96%   BMI 25.89 kg/m   General: Patient well-appearing, in no acute distress. Resp: Normal work of breathing noted MSK: limited active rotation bilaterally, full passive ROM with flexion, extension, sidebending and rotation, tight tissue texture changes noted along C4-C7 on the left lateral neck, no gross deformity noted, no erythema or edema noted, negative Spurling's testing Neuro: CN 11 grossly intact, 5/5 UE strength bilaterally, normal gait   ASSESSMENT/PLAN:   Neck pain, acute -neurovascularly intact with unremarkable neurological exam, likely secondary to cervical strain  -recommended application of voltaren gel, avoid ibuprofen given history of cirrhosis -advised to apply heating pad -advised to buy comfortably pillows to prevent this from occurring in the future -handout provided and discussed regarding neck strain stretching exercises to alleviate symptoms -follow up as appropriate   -PHQ-9 score of 4 with negative question 9 reviewed.   Reece Leader, DO [facility]

## 2022-07-02 DIAGNOSIS — M542 Cervicalgia: Secondary | ICD-10-CM | POA: Insufficient documentation

## 2022-07-02 NOTE — Assessment & Plan Note (Signed)
-  neurovascularly intact with unremarkable neurological exam, likely secondary to cervical strain  -recommended application of voltaren gel, avoid ibuprofen given history of cirrhosis -advised to apply heating pad -advised to buy comfortably pillows to prevent this from occurring in the future -handout provided and discussed regarding neck strain stretching exercises to alleviate symptoms -follow up as appropriate

## 2022-07-29 ENCOUNTER — Ambulatory Visit (INDEPENDENT_AMBULATORY_CARE_PROVIDER_SITE_OTHER): Payer: 59

## 2022-07-29 ENCOUNTER — Other Ambulatory Visit: Payer: Self-pay

## 2022-07-29 ENCOUNTER — Encounter (HOSPITAL_COMMUNITY): Payer: Self-pay | Admitting: Emergency Medicine

## 2022-07-29 ENCOUNTER — Ambulatory Visit (HOSPITAL_COMMUNITY)
Admission: EM | Admit: 2022-07-29 | Discharge: 2022-07-29 | Disposition: A | Discharge status: Home / Self Care | Payer: 59 | Attending: Emergency Medicine | Admitting: Emergency Medicine

## 2022-07-29 DIAGNOSIS — J069 Acute upper respiratory infection, unspecified: Secondary | ICD-10-CM | POA: Diagnosis not present

## 2022-07-29 DIAGNOSIS — U071 COVID-19: Secondary | ICD-10-CM | POA: Insufficient documentation

## 2022-07-29 DIAGNOSIS — R509 Fever, unspecified: Secondary | ICD-10-CM | POA: Diagnosis not present

## 2022-07-29 DIAGNOSIS — R079 Chest pain, unspecified: Secondary | ICD-10-CM | POA: Diagnosis not present

## 2022-07-29 DIAGNOSIS — R059 Cough, unspecified: Secondary | ICD-10-CM | POA: Diagnosis not present

## 2022-07-29 LAB — POCT INFLUENZA A/B
Influenza A, POC: NEGATIVE
Influenza B, POC: NEGATIVE

## 2022-07-29 MED ORDER — ACETAMINOPHEN 325 MG PO TABS
650.0000 mg | ORAL_TABLET | Freq: Once | ORAL | Status: AC
  Administered 2022-07-29: 650 mg via ORAL

## 2022-07-29 MED ORDER — ACETAMINOPHEN 325 MG PO TABS
ORAL_TABLET | ORAL | Status: AC
  Filled 2022-07-29: qty 2

## 2022-07-29 NOTE — ED Triage Notes (Addendum)
Patient presents to [facility] for evaluation of 2 days fever, cough, congestion, sore throat, chills, bodyaches  Patient has fever in the room, however she states she cannot take Tylenol due to "liver issues", awaiting provider review

## 2022-07-29 NOTE — Discharge Instructions (Signed)
Your flu test is negative. Covid test is pending - it should come back tomorrow at the latest. You will get a call if test is positive, you will not get a call if test is negative but you can check results in MyChart if you have a MyChart account.   You do not have pneumonia on x-ray.   Rest and drink lots of liquids. You can take plain mucinex to help congestion thin and drain. It is ok to take mucinex with liver disease  You were given 650mg  of tylenol today at urgent care. You can have another 2 doses of tylenol today. You can safely take up to 2000mg  of tylenol (acetaminophen) each day.  Tylenol can help with aches and fever.

## 2022-07-29 NOTE — ED Notes (Signed)
Escorted patient to the door

## 2022-07-29 NOTE — ED Provider Notes (Signed)
[facility]    CSN: [id number] Arrival date & time: 07/29/22  1242      History   Chief Complaint Chief Complaint  Patient presents with   Fever    HPI Susan Ingram is a 82 y.o. female.  Felt fine on 07/27/2022.  Woke up yesterday morning with cough and congestion.  Over the course of the day yesterday, additionally developed fever, body aches, sore throat.  Has not taken any medicine except Coricidin HBP which did not help her symptoms at all.  Denies nasal drainage or productive cough.  Does feel "chest is rattling" when she coughs   Fever   Past Medical History:  Diagnosis Date   Acute pharyngitis 02/17/2022   Arthralgia of right temporomandibular joint 10/10/2012   Arthritis    RIGHT KNEE   Asthma    Autoimmune hepatitis (HCC) 07/28/2021   Cancer (HCC)    lung   Cervical stenosis of spinal canal 09/03/2015   MRI 08/2015: Degenerative changes resulting in moderate canal stenosis C3-4, mild to moderate at C4-5 and C5-6.   Neural foraminal narrowing C3-4 thru C6-7, moderate at multiple levels.   Diabetes (HCC) 02/28/2018   Dizzy spells 08/11/2013   Eczema    Elevated alkaline phosphatase level 04/10/2020   Eustachian tube dysfunction 07/23/2016   Heartburn 06/18/2014   Helicobacter pylori gastritis 10/08/2014   History of lung cancer 11/24/2010   S/P Bronchoscopy, left VATS, wedge resection,lingular segmentectomy, lymph node dissection 05/23/2008, [place]. Recently seen by [facility] on 10/02/13.  Negative CT scan. Patient now cured.    History of lung cancer 11/24/2010   S/P Bronchoscopy, left VATS, wedge resection,lingular segmentectomy, lymph node dissection 05/23/2008, [place]. Recently seen by [facility] on 10/02/13.  Negative CT scan. Patient now cured.    History of vitamin D deficiency 02/18/2012   Hypercholesterolemia    Hypertension    Hypertension associated with diabetes (HCC)    Hypokalemia 04/10/2020   Medicare annual wellness  visit, subsequent 12/02/2021   Otalgia of both ears 01/03/2015   Retina disorder, left 04/22/2015   Seborrheic keratosis 09/03/2015   Throat clearing 09/15/2013    Patient Active Problem List   Diagnosis Date Noted   Neck pain, acute 07/02/2022   Hepatic fibrosis 10/05/2021   Autoimmune hepatitis (HCC) with cirrhosis and fibrosis 07/28/2021   Cholelithiasis without cholecystitis 10/04/2020   Hepatic steatosis 10/04/2020   Systolic murmur 09/19/2020   Mild cognitive impairment 04/09/2019   Poor short term memory 03/16/2019   Type 2 diabetes mellitus without complications (HCC) 03/16/2019   Renal mass 06/24/2018   Weight loss 06/24/2018   Mild depression 05/11/2018   Generalized anxiety disorder 05/11/2018   Major depressive disorder, recurrent episode (HCC) 02/11/2016   Allergic rhinitis 04/22/2015   Periungual wart 01/03/2014   Hypertension 02/18/2012   Asthma 02/18/2012   Hyperlipidemia     Past Surgical History:  Procedure Laterality Date   Bronchoscopy, left video-assisted thoracoscopy, wedge  05/23/2008   Left thyroid lobectomy, frozen section.  10/11/2002   VAGINAL HYSTERECTOMY  1966   in her 20's    WRIST SURGERY Left 02/15/2017   For carpal tunnel? 2019 is estimate    OB History   No obstetric history on file.      Home Medications    Prior to Admission medications   Medication Sig Start Date End Date Taking? Authorizing Provider  albuterol (PROAIR HFA) 108 (90 Base) MCG/ACT inhaler Inhale 2 puffs into the lungs every 6 (six) hours as needed  for wheezing or shortness of breath. 12/02/21   Fayette Pho, MD  atorvastatin (LIPITOR) 40 MG tablet Take 1 tablet (40 mg total) by mouth daily. 11/10/21   Littie Deeds, MD  cetirizine (ZYRTEC) 10 MG tablet Take 1/2 (one-half) tablet by mouth once daily 06/01/22   Fayette Pho, MD  chlorthalidone (HYGROTON) 25 MG tablet Take 1 tablet by mouth once daily 03/08/22   Fayette Pho, MD  fluticasone [facility]) 50  MCG/ACT nasal spray Use 2 spray(s) in each nostril once daily 12/02/21   Fayette Pho, MD  lidocaine (XYLOCAINE) 2 % jelly Apply 1 application. topically as needed. 05/05/21   Fayette Pho, MD  metFORMIN (GLUCOPHAGE) 500 MG tablet Take 1 tablet (500 mg total) by mouth daily. 06/01/22   Fayette Pho, MD  polyethylene glycol powder [facility]) 17 GM/SCOOP powder Take 17 g by mouth 2 (two) times daily as needed. 10/14/21   Fayette Pho, MD  potassium chloride (K-DUR) 10 MEQ tablet Take 1 tablet (10 mEq total) by mouth daily. 02/18/12 10/03/12  Tommie Sams, DO    Family History Family History  Problem Relation Age of Onset   Early death Mother    Hypertension Mother    Stroke Mother    Alzheimer's disease Father    Diabetes Brother    Breast cancer Maternal Aunt    Breast cancer Cousin    Colon cancer Neg Hx     Social History Social History   Tobacco Use   Smoking status: Former    Packs/day: 2.00    Years: 30.00    Additional pack years: 0.00    Total pack years: 60.00    Types: Cigarettes    Quit date: 04/15/1993    Years since quitting: 29.3   Smokeless tobacco: Never  Vaping Use   Vaping Use: Never used  Substance Use Topics   Alcohol use: No   Drug use: No     Allergies   Amlodipine, Maxzide [triamterene-hctz], and Toprol xl [metoprolol tartrate]   Review of Systems Review of Systems  Constitutional:  Positive for fever.     Physical Exam Triage Vital Signs ED Triage Vitals [07/29/22 1357]  Enc Vitals Group     BP 139/82     Pulse Rate 91     Resp 20     Temp (!) 101 F (38.3 C)     Temp Source Oral     SpO2 94 %     Weight      Height      Head Circumference      Peak Flow      Pain Score 8     Pain Loc      Pain Edu?      Excl. in GC?    No data found.  Updated Vital Signs BP 139/82 (BP Location: Left Arm)   Pulse 91   Temp (!) 101 F (38.3 C) (Oral)   Resp 20   SpO2 94%   Visual Acuity Right Eye Distance:   Left Eye  Distance:   Bilateral Distance:    Right Eye Near:   Left Eye Near:    Bilateral Near:     Physical Exam Constitutional:      Appearance: Normal appearance. She is ill-appearing.  HENT:     Right Ear: Tympanic membrane, ear canal and external ear normal.     Left Ear: Tympanic membrane, ear canal and external ear normal.     Mouth/Throat:     Mouth: Mucous  membranes are moist.     Pharynx: Oropharynx is clear. No oropharyngeal exudate or posterior oropharyngeal erythema.  Cardiovascular:     Rate and Rhythm: Normal rate and regular rhythm.  Pulmonary:     Effort: Pulmonary effort is normal.     Breath sounds: No wheezing or rhonchi.     Comments: Patient coughs occasionally and it is a wet sounding cough.  I do not hear wheezes or rhonchi though Lymphadenopathy:     Head:     Right side of head: No submandibular adenopathy.     Left side of head: No submandibular adenopathy.  Neurological:     Mental Status: She is alert.      UC Treatments / Results  Labs (all labs ordered are listed, but only abnormal results are displayed) Labs Reviewed  SARS CORONAVIRUS 2 (TAT 6-24 HRS)  POCT INFLUENZA A/B    EKG   Radiology DG Chest 2 View  Result Date: 07/29/2022 CLINICAL DATA:  Cough, fever EXAM: CHEST - 2 VIEW COMPARISON:  12/19/2013 FINDINGS: Cardiac size is within normal limits. There are no signs of pulmonary edema or focal pulmonary consolidation. Blunting of left lateral CP angle has not changed suggesting scarring. Surgical clips are seen in left thyroid bed. Surgical clips and staples are noted overlying the left hilum. There is no pneumothorax. IMPRESSION: There are no signs of pulmonary edema or focal pulmonary consolidation. Blunting of left lateral CP angle has not changed suggesting scarring. Electronically Signed   By: Ernie Avena M.D.   On: 07/29/2022 15:07    Procedures Procedures (including critical care time)  Medications Ordered in UC Medications   acetaminophen (TYLENOL) tablet 650 mg (650 mg Oral Given 07/29/22 1442)    Initial Impression / Assessment and Plan / UC Course  I have reviewed the triage vital signs and the nursing notes.  Pertinent labs & imaging results that were available during my care of the patient were reviewed by me and considered in my medical decision making (see chart for details).    Flu negative.  COVID pending.  Chest x-ray did not show pneumonia.  Discussed supportive care.  As patient has liver disease, she is hesitant to take medications.  Reviewed is okay for her to use Mucinex and safe doses of Tylenol for her.  Final Clinical Impressions(s) / UC Diagnoses   Final diagnoses:  Upper respiratory tract infection, unspecified type     Discharge Instructions      Your flu test is negative. Covid test is pending - it should come back tomorrow at the latest. You will get a call if test is positive, you will not get a call if test is negative but you can check results in MyChart if you have a MyChart account.   You do not have pneumonia on x-ray.   Rest and drink lots of liquids. You can take plain mucinex to help congestion thin and drain. It is ok to take mucinex with liver disease  You were given 650mg  of tylenol today at urgent care. You can have another 2 doses of tylenol today. You can safely take up to 2000mg  of tylenol (acetaminophen) each day.  Tylenol can help with aches and fever.     ED Prescriptions   None    PDMP not reviewed this encounter.   Cathlyn Parsons, NP 07/29/22 1531

## 2022-07-30 ENCOUNTER — Ambulatory Visit: Payer: 59

## 2022-07-30 LAB — SARS CORONAVIRUS 2 (TAT 6-24 HRS): SARS Coronavirus 2: POSITIVE — AB

## 2022-08-04 ENCOUNTER — Encounter (HOSPITAL_COMMUNITY): Payer: Self-pay

## 2022-08-04 ENCOUNTER — Ambulatory Visit (HOSPITAL_COMMUNITY)
Admission: RE | Admit: 2022-08-04 | Discharge: 2022-08-04 | Disposition: A | Discharge status: Home / Self Care | Payer: 59 | Source: Ambulatory Visit | Attending: Family Medicine | Admitting: Family Medicine

## 2022-08-04 ENCOUNTER — Ambulatory Visit (INDEPENDENT_AMBULATORY_CARE_PROVIDER_SITE_OTHER): Payer: 59

## 2022-08-04 VITALS — BP 144/81 | HR 87 | Temp 99.4°F | Resp 16 | Ht 66.0 in | Wt 164.0 lb

## 2022-08-04 DIAGNOSIS — J019 Acute sinusitis, unspecified: Secondary | ICD-10-CM | POA: Diagnosis not present

## 2022-08-04 DIAGNOSIS — J4521 Mild intermittent asthma with (acute) exacerbation: Secondary | ICD-10-CM

## 2022-08-04 DIAGNOSIS — U071 COVID-19: Secondary | ICD-10-CM

## 2022-08-04 DIAGNOSIS — R059 Cough, unspecified: Secondary | ICD-10-CM | POA: Diagnosis not present

## 2022-08-04 MED ORDER — PREDNISONE 20 MG PO TABS: 40.0000 mg | ORAL_TABLET | Freq: Every day | ORAL | 0 refills | Status: AC

## 2022-08-04 MED ORDER — CHERATUSSIN AC 100-10 MG/5ML PO SOLN: 5.0000 mL | Freq: Four times a day (QID) | ORAL | 0 refills | Status: DC | PRN

## 2022-08-04 MED ORDER — DOXYCYCLINE HYCLATE 100 MG PO CAPS: 100.0000 mg | ORAL_CAPSULE | Freq: Two times a day (BID) | ORAL | 0 refills | Status: AC

## 2022-08-04 NOTE — Discharge Instructions (Addendum)
Chest x-ray by my review does not show any fluid or pneumonia.  I compared it to your x-ray you had done last week.  The radiologist will also be looking at your x-ray, and if their report has anything on it that would change your management or diagnosis, we will call you.    Take doxycycline 100 mg --1 capsule 2 times daily for 7 days; this is for possible sinus infection since her sputum has gotten thicker  Robitussin with codeine cough syrup--take 5 mL or 1 teaspoon every 6 hours as needed for cough.  Take prednisone 20 mg--2 daily for 5 days; this is for inflammation in your lungs since you are coughing more  Continue to use your albuterol inhaler when needed for wheezing or tightness in her chest.

## 2022-08-04 NOTE — ED Triage Notes (Signed)
Patient here today with c/o productive cough X 8 days. She states that she had a positive Covid test done. Patient states that she is feeling better but still having difficulty sleeping at night due to the cough. She has been taking Mucinex which she thinks that it made her cough more.

## 2022-08-04 NOTE — ED Provider Notes (Signed)
[facility]    CSN: [id number] Arrival date & time: 08/04/22  1350      History   Chief Complaint Chief Complaint  Patient presents with   Cough    Bad cough and cold - Entered by patient    HPI Susan Ingram is a 82 y.o. female.    Cough  Here for increased sputum production with her cough.  She also notes thicker nasal drainage now.  On June 13 she was seen here.  Flu test was negative and COVID was positive the next day.  She was not prescribed any antivirals.  To the nursing staff she states she feels some better but to me she states she wants to make sure "that that COVID has not gotten worse".  When she was seen here she had a 101 temperature. She thinks she maybe had a fever last night but it was subjective, she did not have a thermometer  She states she did wheeze a little bit last week but he is not wheezing now.      Past Medical History:  Diagnosis Date   Acute pharyngitis 02/17/2022   Arthralgia of right temporomandibular joint 10/10/2012   Arthritis    RIGHT KNEE   Asthma    Autoimmune hepatitis (HCC) 07/28/2021   Cancer (HCC)    lung   Cervical stenosis of spinal canal 09/03/2015   MRI 08/2015: Degenerative changes resulting in moderate canal stenosis C3-4, mild to moderate at C4-5 and C5-6.   Neural foraminal narrowing C3-4 thru C6-7, moderate at multiple levels.   Diabetes (HCC) 02/28/2018   Dizzy spells 08/11/2013   Eczema    Elevated alkaline phosphatase level 04/10/2020   Eustachian tube dysfunction 07/23/2016   Heartburn 06/18/2014   Helicobacter pylori gastritis 10/08/2014   History of lung cancer 11/24/2010   S/P Bronchoscopy, left VATS, wedge resection,lingular segmentectomy, lymph node dissection 05/23/2008, [place]. Recently seen by [facility] on 10/02/13.  Negative CT scan. Patient now cured.    History of lung cancer 11/24/2010   S/P Bronchoscopy, left VATS, wedge resection,lingular segmentectomy, lymph node  dissection 05/23/2008, [place]. Recently seen by [facility] on 10/02/13.  Negative CT scan. Patient now cured.    History of vitamin D deficiency 02/18/2012   Hypercholesterolemia    Hypertension    Hypertension associated with diabetes (HCC)    Hypokalemia 04/10/2020   Medicare annual wellness visit, subsequent 12/02/2021   Otalgia of both ears 01/03/2015   Retina disorder, left 04/22/2015   Seborrheic keratosis 09/03/2015   Throat clearing 09/15/2013    Patient Active Problem List   Diagnosis Date Noted   Neck pain, acute 07/02/2022   Hepatic fibrosis 10/05/2021   Autoimmune hepatitis (HCC) with cirrhosis and fibrosis 07/28/2021   Cholelithiasis without cholecystitis 10/04/2020   Hepatic steatosis 10/04/2020   Systolic murmur 09/19/2020   Mild cognitive impairment 04/09/2019   Poor short term memory 03/16/2019   Type 2 diabetes mellitus without complications (HCC) 03/16/2019   Renal mass 06/24/2018   Weight loss 06/24/2018   Mild depression 05/11/2018   Generalized anxiety disorder 05/11/2018   Major depressive disorder, recurrent episode (HCC) 02/11/2016   Allergic rhinitis 04/22/2015   Periungual wart 01/03/2014   Hypertension 02/18/2012   Asthma 02/18/2012   Hyperlipidemia     Past Surgical History:  Procedure Laterality Date   Bronchoscopy, left video-assisted thoracoscopy, wedge  05/23/2008   Left thyroid lobectomy, frozen section.  10/11/2002   VAGINAL HYSTERECTOMY  1966   in her 20's  WRIST SURGERY Left 02/15/2017   For carpal tunnel? 2019 is estimate    OB History   No obstetric history on file.      Home Medications    Prior to Admission medications   Medication Sig Start Date End Date Taking? Authorizing Provider  doxycycline (VIBRAMYCIN) 100 MG capsule Take 1 capsule (100 mg total) by mouth 2 (two) times daily for 7 days. 08/04/22 08/11/22 Yes Giuliano Preece, Janace Aris, MD  guaiFENesin-codeine (CHERATUSSIN AC) 100-10 MG/5ML syrup Take 5 mLs by mouth 4  (four) times daily as needed for cough. 08/04/22  Yes Zenia Resides, MD  predniSONE (DELTASONE) 20 MG tablet Take 2 tablets (40 mg total) by mouth daily with breakfast for 5 days. 08/04/22 08/09/22 Yes Zenia Resides, MD  albuterol (PROAIR HFA) 108 (90 Base) MCG/ACT inhaler Inhale 2 puffs into the lungs every 6 (six) hours as needed for wheezing or shortness of breath. 12/02/21   Fayette Pho, MD  atorvastatin (LIPITOR) 40 MG tablet Take 1 tablet (40 mg total) by mouth daily. 11/10/21   Littie Deeds, MD  cetirizine (ZYRTEC) 10 MG tablet Take 1/2 (one-half) tablet by mouth once daily 06/01/22   Fayette Pho, MD  chlorthalidone (HYGROTON) 25 MG tablet Take 1 tablet by mouth once daily 03/08/22   Fayette Pho, MD  fluticasone [facility]) 50 MCG/ACT nasal spray Use 2 spray(s) in each nostril once daily 12/02/21   Fayette Pho, MD  lidocaine (XYLOCAINE) 2 % jelly Apply 1 application. topically as needed. 05/05/21   Fayette Pho, MD  metFORMIN (GLUCOPHAGE) 500 MG tablet Take 1 tablet (500 mg total) by mouth daily. 06/01/22   Fayette Pho, MD  polyethylene glycol powder [facility]) 17 GM/SCOOP powder Take 17 g by mouth 2 (two) times daily as needed. 10/14/21   Fayette Pho, MD  potassium chloride (K-DUR) 10 MEQ tablet Take 1 tablet (10 mEq total) by mouth daily. 02/18/12 10/03/12  Tommie Sams, DO    Family History Family History  Problem Relation Age of Onset   Early death Mother    Hypertension Mother    Stroke Mother    Alzheimer's disease Father    Diabetes Brother    Breast cancer Maternal Aunt    Breast cancer Cousin    Colon cancer Neg Hx     Social History Social History   Tobacco Use   Smoking status: Former    Packs/day: 2.00    Years: 30.00    Additional pack years: 0.00    Total pack years: 60.00    Types: Cigarettes    Quit date: 04/15/1993    Years since quitting: 29.3   Smokeless tobacco: Never  Vaping Use   Vaping Use: Never used  Substance Use  Topics   Alcohol use: No   Drug use: No     Allergies   Amlodipine, Maxzide [triamterene-hctz], and Toprol xl [metoprolol tartrate]   Review of Systems Review of Systems  Respiratory:  Positive for cough.      Physical Exam Triage Vital Signs ED Triage Vitals  Enc Vitals Group     BP 08/04/22 1408 (!) 144/81     Pulse Rate 08/04/22 1408 87     Resp 08/04/22 1408 16     Temp 08/04/22 1408 99.4 F (37.4 C)     Temp Source 08/04/22 1408 Oral     SpO2 08/04/22 1408 94 %     Weight 08/04/22 1407 164 lb (74.4 kg)     Height 08/04/22 1407 5'  6" (1.676 m)     Head Circumference --      Peak Flow --      Pain Score 08/04/22 1407 0     Pain Loc --      Pain Edu? --      Excl. in GC? --    No data found.  Updated Vital Signs BP (!) 144/81 (BP Location: Left Arm)   Pulse 87   Temp 99.4 F (37.4 C) (Oral)   Resp 16   Ht 5\' 6"  (1.676 m)   Wt 74.4 kg   SpO2 94%   BMI 26.47 kg/m   Visual Acuity Right Eye Distance:   Left Eye Distance:   Bilateral Distance:    Right Eye Near:   Left Eye Near:    Bilateral Near:     Physical Exam Vitals reviewed.  Constitutional:      General: She is not in acute distress.    Appearance: She is not toxic-appearing.  HENT:     Nose: Congestion present.     Mouth/Throat:     Mouth: Mucous membranes are moist.     Pharynx: No oropharyngeal exudate or posterior oropharyngeal erythema.  Eyes:     Extraocular Movements: Extraocular movements intact.     Conjunctiva/sclera: Conjunctivae normal.     Pupils: Pupils are equal, round, and reactive to light.  Cardiovascular:     Rate and Rhythm: Normal rate and regular rhythm.     Heart sounds: No murmur heard. Pulmonary:     Effort: No respiratory distress.     Breath sounds: No stridor. No wheezing, rhonchi or rales.  Musculoskeletal:     Cervical back: Neck supple.  Lymphadenopathy:     Cervical: No cervical adenopathy.  Skin:    Capillary Refill: Capillary refill takes less  than 2 seconds.     Coloration: Skin is not jaundiced or pale.  Neurological:     General: No focal deficit present.     Mental Status: She is alert and oriented to person, place, and time.  Psychiatric:        Behavior: Behavior normal.      UC Treatments / Results  Labs (all labs ordered are listed, but only abnormal results are displayed) Labs Reviewed - No data to display  EKG   Radiology No results found.  Procedures Procedures (including critical care time)  Medications Ordered in UC Medications - No data to display  Initial Impression / Assessment and Plan / UC Course  I have reviewed the triage vital signs and the nursing notes.  Pertinent labs & imaging results that were available during my care of the patient were reviewed by me and considered in my medical decision making (see chart for details).       Chest x-ray was repeated today though her lung exam was clear, as she repeatedly expressed concern her COVID was worse. Chest x-ray by my review is clear and is unchanged from previous she does have a blunted left costophrenic angle but that is also stable.  No infiltrate or fluid.  With her sputum thickening up I am going to send in doxycycline for possible sinus infection. Still concerned that she is having some asthma exacerbation since she is coughing more, so prednisone is sent in for 5 days also.  Robitussin with codeine is sent in for the cough Final Clinical Impressions(s) / UC Diagnoses   Final diagnoses:  Acute sinusitis, recurrence not specified, unspecified location  COVID  Mild intermittent  asthma with exacerbation     Discharge Instructions      Chest x-ray by my review does not show any fluid or pneumonia.  I compared it to your x-ray you had done last week.  The radiologist will also be looking at your x-ray, and if their report has anything on it that would change your management or diagnosis, we will call you.    Take doxycycline 100  mg --1 capsule 2 times daily for 7 days; this is for possible sinus infection since her sputum has gotten thicker  Robitussin with codeine cough syrup--take 5 mL or 1 teaspoon every 6 hours as needed for cough.  Take prednisone 20 mg--2 daily for 5 days; this is for inflammation in your lungs since you are coughing more  Continue to use your albuterol inhaler when needed for wheezing or tightness in her chest.      ED Prescriptions     Medication Sig Dispense Auth. Provider   doxycycline (VIBRAMYCIN) 100 MG capsule Take 1 capsule (100 mg total) by mouth 2 (two) times daily for 7 days. 14 capsule Zenia Resides, MD   predniSONE (DELTASONE) 20 MG tablet Take 2 tablets (40 mg total) by mouth daily with breakfast for 5 days. 10 tablet Krissi Willaims, Janace Aris, MD   guaiFENesin-codeine (CHERATUSSIN AC) 100-10 MG/5ML syrup Take 5 mLs by mouth 4 (four) times daily as needed for cough. 120 mL Zenia Resides, MD      I have reviewed the PDMP during this encounter.   Zenia Resides, MD 08/04/22 1500

## 2022-08-26 ENCOUNTER — Ambulatory Visit: Payer: 59 | Admitting: Student

## 2022-08-26 NOTE — Progress Notes (Deleted)
  SUBJECTIVE:   CHIEF COMPLAINT / HPI:   ***  PERTINENT  PMH / PSH: Multiple, MDD/anxiety, HTN, HLD, T2DM, autoimmune hepatitis  Patient Care Team: [name], DO as PCP - General (Family Medicine) Loreli Slot, MD (Cardiothoracic Surgery) Mia Creek, MD (Urgent Care) Sheffield, Judye Bos, PA-C as Physician Assistant (Dermatology) Linna Darner, RD as Dietitian (Family Medicine) Iva Boop, MD as Consulting Physician (Gastroenterology) Annamarie Major, CRNP as Nurse Practitioner (Nurse Practitioner) Martina Sinner, MD as Consulting Physician (Pulmonary Disease) OBJECTIVE:  There were no vitals taken for this visit. Physical Exam   ASSESSMENT/PLAN:  There are no diagnoses linked to this encounter. No follow-ups on file. [name], DO 08/26/2022, 8:12 AM PGY-***, [facility] {    This will disappear when note is signed, click to select method of visit    :1}

## 2022-08-30 ENCOUNTER — Encounter: Payer: Self-pay | Admitting: Student

## 2022-08-30 ENCOUNTER — Ambulatory Visit: Payer: 59 | Admitting: Student

## 2022-08-30 VITALS — BP 136/82 | HR 70 | Ht 66.0 in | Wt 154.2 lb

## 2022-08-30 DIAGNOSIS — J392 Other diseases of pharynx: Secondary | ICD-10-CM | POA: Diagnosis not present

## 2022-08-30 DIAGNOSIS — E119 Type 2 diabetes mellitus without complications: Secondary | ICD-10-CM

## 2022-08-30 LAB — POCT GLYCOSYLATED HEMOGLOBIN (HGB A1C): HbA1c, POC (controlled diabetic range): 7 % (ref 0.0–7.0)

## 2022-08-30 MED ORDER — FAMOTIDINE 20 MG PO TABS
20.0000 mg | ORAL_TABLET | Freq: Every day | ORAL | 0 refills | Status: DC
Start: 2022-08-30 — End: 2022-09-13

## 2022-08-30 NOTE — Assessment & Plan Note (Signed)
well controlled.  A1c 7.0. - Medications: Metformin 500 mg daily

## 2022-08-30 NOTE — Assessment & Plan Note (Signed)
Postviral versus reflux.  Anterior cervical lymphadenopathy is an odd association however could be remnant from prior viral infection.  Given this most bothers her at night, I do suspect this is more likely to be reflux and will treat as this and follow-up in 1 month.

## 2022-08-30 NOTE — Progress Notes (Signed)
  SUBJECTIVE:   CHIEF COMPLAINT / HPI:   Having to clear her throat more often, noticed it after having COVID 4 weeks ago. She has needed to clear her throat more often for the last 2 weeks. She feels like there is something in her throat.  Denies cough.  She hasn't tried any medications for this. She has tried lemon heads which has helped some. She does have dentures. She has trouble with her dentures chewing but is able to swallow fine. She has never been diagnosed with reflux.  It bothers her more often at nighttime.  PERTINENT  PMH / PSH: Multiple, MDD/anxiety, HTN, HLD, T2DM, autoimmune hepatitis  Patient Care Team: [name], DO as PCP - General (Family Medicine) Loreli Slot, MD (Cardiothoracic Surgery) Mia Creek, MD (Urgent Care) Sheffield, Judye Bos, PA-C as Physician Assistant (Dermatology) Linna Darner, RD as Dietitian (Family Medicine) Iva Boop, MD as Consulting Physician (Gastroenterology) Annamarie Major, CRNP as Nurse Practitioner (Nurse Practitioner) Martina Sinner, MD as Consulting Physician (Pulmonary Disease) OBJECTIVE:  BP 136/82   Pulse 70   Ht 5\' 6"  (1.676 m)   Wt 154 lb 3.2 oz (69.9 kg)   SpO2 100%   BMI 24.89 kg/m  Physical Exam Constitutional:      Appearance: Normal appearance.  HENT:     Mouth/Throat:     Mouth: Mucous membranes are moist.     Pharynx: Oropharynx is clear. No oropharyngeal exudate or posterior oropharyngeal erythema.  Cardiovascular:     Rate and Rhythm: Normal rate and regular rhythm.     Heart sounds: Normal heart sounds.  Pulmonary:     Breath sounds: Normal breath sounds.  Neurological:     Mental Status: She is alert.    ASSESSMENT/PLAN:  Throat irritation Assessment & Plan: Postviral versus reflux.  Anterior cervical lymphadenopathy is an odd association however could be remnant from prior viral infection.  Given this most bothers her at night, I do suspect this is more likely to be reflux and  will treat as this and follow-up in 1 month.  Orders: -     Famotidine; Take 1 tablet (20 mg total) by mouth daily.  Dispense: 30 tablet; Refill: 0  Type 2 diabetes mellitus without complication, without long-term current use of insulin (HCC) Assessment & Plan: well controlled.  A1c 7.0. - Medications: Metformin 500 mg daily  Orders: -     POCT glycosylated hemoglobin (Hb A1C)  Return in about 1 month (around 09/30/2022) for Follow-up throat clearing. [name], DO 08/30/2022, 11:19 AM PGY-3, [facility]

## 2022-08-30 NOTE — Patient Instructions (Signed)
It was great to see you today! Thank you for choosing [facility] for your primary care. Susan Ingram was seen for throat concerns.  Today we addressed: I do suspect there is some reflux here.  I prescribed you Pepcid to take daily.  I would like you to follow-up in 1 month so that we can reassess. Your A1c was 7.0 which is great  If you haven't already, sign up for My Chart to have easy access to your labs results, and communication with your primary care physician.  You should return to our clinic Return in about 1 month (around 09/30/2022) for Follow-up throat clearing. Please arrive 15 minutes before your appointment to ensure smooth check in process.  We appreciate your efforts in making this happen.  Thank you for allowing me to participate in your care, [name], DO 08/30/2022, 11:15 AM PGY-3, [facility]

## 2022-09-02 ENCOUNTER — Ambulatory Visit: Payer: 59

## 2022-09-02 NOTE — Progress Notes (Deleted)
Subjective:   Susan Ingram is a 82 y.o. female who presents for Medicare Annual (Subsequent) preventive examination.  Visit Complete: {VISITMETHOD:[phone]}  Patient Medicare AWV questionnaire was completed by the patient on ***; I have confirmed that all information answered by patient is correct and no changes since this date.  Review of Systems    ***       Objective:    There were no vitals filed for this visit. There is no height or weight on file to calculate BMI.     08/30/2022   10:52 AM 07/01/2022    4:00 PM 05/28/2022    1:30 PM 02/17/2022    1:24 PM 12/02/2021   11:16 AM 07/20/2021   11:42 AM 07/07/2021   11:44 AM  Advanced Directives  Does Patient Have a Medical Advance Directive? No No No No No No No  Would patient like information on creating a medical advance directive?    No - Patient declined Yes (MAU/Ambulatory/Procedural Areas - Information given) No - Patient declined No - Patient declined    Current Medications (verified) Outpatient Encounter Medications as of 09/03/2022  Medication Sig   albuterol (PROAIR HFA) 108 (90 Base) MCG/ACT inhaler Inhale 2 puffs into the lungs every 6 (six) hours as needed for wheezing or shortness of breath.   atorvastatin (LIPITOR) 40 MG tablet Take 1 tablet (40 mg total) by mouth daily.   chlorthalidone (HYGROTON) 25 MG tablet Take 1 tablet by mouth once daily   famotidine (PEPCID) 20 MG tablet Take 1 tablet (20 mg total) by mouth daily.   fluticasone (FLONASE) 50 MCG/ACT nasal spray Use 2 spray(s) in each nostril once daily   metFORMIN (GLUCOPHAGE) 500 MG tablet Take 1 tablet (500 mg total) by mouth daily.   polyethylene glycol powder (GLYCOLAX/MIRALAX) 17 GM/SCOOP powder Take 17 g by mouth 2 (two) times daily as needed.   [DISCONTINUED] potassium chloride (K-DUR) 10 MEQ tablet Take 1 tablet (10 mEq total) by mouth daily.   No facility-administered encounter medications on file as of 09/03/2022.    Allergies  (verified) Amlodipine, Maxzide [triamterene-hctz], and Toprol xl [metoprolol tartrate]   History: Past Medical History:  Diagnosis Date   Acute pharyngitis 02/17/2022   Arthralgia of right temporomandibular joint 10/10/2012   Arthritis    RIGHT KNEE   Asthma    Autoimmune hepatitis (HCC) 07/28/2021   Cancer (HCC)    lung   Cervical stenosis of spinal canal 09/03/2015   MRI 08/2015: Degenerative changes resulting in moderate canal stenosis C3-4, mild to moderate at C4-5 and C5-6.   Neural foraminal narrowing C3-4 thru C6-7, moderate at multiple levels.   Diabetes (HCC) 02/28/2018   Dizzy spells 08/11/2013   Eczema    Elevated alkaline phosphatase level 04/10/2020   Eustachian tube dysfunction 07/23/2016   Heartburn 06/18/2014   Helicobacter pylori gastritis 10/08/2014   History of lung cancer 11/24/2010   S/P Bronchoscopy, left VATS, wedge resection,lingular segmentectomy, lymph node dissection 05/23/2008, [facility]. Recently seen by [facility] on 10/02/13.  Negative CT scan. Patient now cured.    History of lung cancer 11/24/2010   S/P Bronchoscopy, left VATS, wedge resection,lingular segmentectomy, lymph node dissection 05/23/2008, [facility]. Recently seen by [facility] on 10/02/13.  Negative CT scan. Patient now cured.    History of vitamin D deficiency 02/18/2012   Hypercholesterolemia    Hypertension    Hypertension associated with diabetes (HCC)    Hypokalemia 04/10/2020   Medicare annual wellness visit, subsequent 12/02/2021   Otalgia of  both ears 01/03/2015   Retina disorder, left 04/22/2015   Seborrheic keratosis 09/03/2015   Throat clearing 09/15/2013   Past Surgical History:  Procedure Laterality Date   Bronchoscopy, left video-assisted thoracoscopy, wedge  05/23/2008   Left thyroid lobectomy, frozen section.  10/11/2002   VAGINAL HYSTERECTOMY  1966   in her 20's    WRIST SURGERY Left 02/15/2017   For carpal tunnel? 2019 is estimate   Family History  Problem  Relation Age of Onset   Early death Mother    Hypertension Mother    Stroke Mother    Alzheimer's disease Father    Diabetes Brother    Breast cancer Maternal Aunt    Breast cancer Cousin    Colon cancer Neg Hx    Social History   Socioeconomic History   Marital status: Divorced    Spouse name: Not on file   Number of children: 1   Years of education: 12   Highest education level: 12th grade  Occupational History   Occupation: Retired  Tobacco Use   Smoking status: Former    Current packs/day: 0.00    Average packs/day: 2.0 packs/day for 30.0 years (60.0 ttl pk-yrs)    Types: Cigarettes    Start date: 04/16/1963    Quit date: 04/15/1993    Years since quitting: 29.4   Smokeless tobacco: Never  Vaping Use   Vaping status: Never Used  Substance and Sexual Activity   Alcohol use: No   Drug use: No   Sexual activity: Not Currently  Other Topics Concern   Not on file  Social History Narrative   Patient lives alone in [place].   Patient has one daughter and 7 grandchildren.    Patient typically rides the bus for transportation, however her grandchildren will help her get around as needed.   Patient enjoys watching westerns on TV, reading the paper and reading books.    Patient does not have an active work out plan, however she stays "on the move all the time."    Social Determinants of Health   Financial Resource Strain: Low Risk  (05/20/2020)   Overall Financial Resource Strain (CARDIA)    Difficulty of Paying Living Expenses: Not hard at all  Food Insecurity: Low Risk  (04/26/2022)   [facility]   Food vital sign    Within the past 12 months, you worried that your food would run out before you got money to buy more: Never true    Within the past 12 months, the food you bought just didn't last and you didn't have money to get more. : Never true  Transportation Needs: No Transportation Needs (04/26/2022)   [facility]   Transportation    In the past 12 months, has lack of reliable transportation kept you from medical appointments, meetings, work or from getting things needed for daily living? : No  Physical Activity: Inactive (05/20/2020)   Exercise Vital Sign    Days of Exercise per Week: 0 days    Minutes of Exercise per Session: 0 min  Stress: Stress Concern Present (05/20/2020)   [employer] of Occupational Health - Occupational Stress Questionnaire    Feeling of Stress : To some extent  Social Connections: Socially Isolated (05/20/2020)   Social Connection and Isolation Panel [NHANES]    Frequency of Communication with Friends and Family: More than three times a week    Frequency of Social Gatherings with Friends and Family:  More than three times a week    Attends Religious Services: Never    Active Member of Clubs or Organizations: No    Attends Banker Meetings: Never    Marital Status: Divorced    Tobacco Counseling Counseling given: Not Answered   Clinical Intake:                        Activities of Daily Living    12/02/2021   10:28 AM  In your present state of health, do you have any difficulty performing the following activities:  Hearing? 0  Vision? 1  Comment wears prescription glasses  Difficulty concentrating or making decisions? 1  Walking or climbing stairs? 0  Dressing or bathing? 0  Doing errands, shopping? 0  Preparing Food and eating ? N  Using the Toilet? N  In the past six months, have you accidently leaked urine? N  Do you have problems with loss of bowel control? N  Managing your Medications? N  Managing your Finances? N  Housekeeping or managing your Housekeeping? N    Patient Care Team: [name], DO as PCP - General (Family Medicine) Loreli Slot, MD (Cardiothoracic Surgery) Mia Creek, MD (Urgent Care) Spero Curb Judye Bos, PA-C as Physician Assistant (Dermatology) Linna Darner, RD as Dietitian  (Family Medicine) Iva Boop, MD as Consulting Physician (Gastroenterology) Annamarie Major, CRNP as Nurse Practitioner (Nurse Practitioner) Martina Sinner, MD as Consulting Physician (Pulmonary Disease)  Indicate any recent Medical Services you may have received from other than [facility] providers in the past year (date may be approximate).     Assessment:   This is a routine wellness examination for [place].  Hearing/Vision screen No results found.  Dietary issues and exercise activities discussed:     Goals Addressed   None    Depression Screen    08/30/2022   10:51 AM 07/01/2022    3:59 PM 05/28/2022    1:33 PM 12/02/2021   10:06 AM 04/15/2021   10:54 AM 01/12/2021    3:13 PM 09/19/2020   10:45 AM  PHQ 2/9 Scores  PHQ - 2 Score 0 0 5 0 2 2 5   PHQ- 9 Score 4 4 9 9 9 5 13     Fall Risk    07/01/2022    3:59 PM 02/17/2022    1:24 PM 12/02/2021   10:28 AM 04/15/2021   10:54 AM 09/19/2020   10:40 AM  Fall Risk   Falls in the past year? 0 0 0 0 0  Number falls in past yr: 0 0 0 0 0  Injury with Fall? 0 0 0 0   Risk for fall due to :   No Fall Risks      MEDICARE RISK AT HOME:   TIMED UP AND GO:  Was the test performed?  No    Cognitive Function:      04/09/2019   10:26 AM  Montreal Cognitive Assessment   Visuospatial/ Executive (0/5) 1  Naming (0/3) 3  Attention: Read list of digits (0/2) 2  Attention: Read list of letters (0/1) 1  Attention: Serial 7 subtraction starting at 100 (0/3) 0  Language: Repeat phrase (0/2) 0  Language : Fluency (0/1) 0  Abstraction (0/2) 0  Delayed Recall (0/5) 0  Orientation (0/6) 6  Total 13  Adjusted Score (based on education) 14      12/02/2021   10:33 AM 05/20/2020    9:09 AM  6CIT Screen  What Year? 0 points 0 points  What month? 0 points 0 points  What time? 0 points 0 points  Count back from 20 0 points 4 points  Months in reverse 4 points 2 points  Repeat phrase 0 points 0 points  Total Score 4 points 6 points     Immunizations Immunization History  Administered Date(s) Administered   Fluad Quad(high Dose 65+) 12/24/2019   Hepb-cpg 08/03/2021, 09/04/2021   Influenza Split 02/18/2012   Influenza,inj,Quad PF,6+ Mos 12/27/2012, 12/12/2013, 12/09/2014, 12/01/2015, 11/16/2016, 11/10/2017, 10/25/2018, 11/10/2020   Influenza-Unspecified 11/10/2021   Moderna Sars-Covid-2 Vaccination 03/31/2019, 04/28/2019, 01/15/2020   Pfizer Covid-19 Vaccine Bivalent Booster 59yrs & up 01/21/2021   Pneumococcal Conjugate-13 01/02/2014   Pneumococcal Polysaccharide-23 07/15/2016   Tdap 03/24/2017   Unspecified SARS-COV-2 Vaccination 11/10/2021   Zoster Recombinant(Shingrix) 06/13/2017, 08/11/2017    TDAP status: Up to date  Pneumococcal vaccine status: Up to date  Covid-19 vaccine status: Information provided on how to obtain vaccines.   Qualifies for Shingles Vaccine? Yes   Zostavax completed No   Shingrix Completed?: Yes  Screening Tests Health Maintenance  Topic Date Due   FOOT EXAM  01/05/2020   COVID-19 Vaccine (6 - 2023-24 season) 01/05/2022   Diabetic kidney evaluation - eGFR measurement  07/21/2022   INFLUENZA VACCINE  09/16/2022   OPHTHALMOLOGY EXAM  02/26/2023   HEMOGLOBIN A1C  03/02/2023   Diabetic kidney evaluation - Urine ACR  05/28/2023   Medicare Annual Wellness (AWV)  05/28/2023   DTaP/Tdap/Td (2 - Td or Tdap) 03/25/2027   Pneumonia Vaccine 67+ Years old  Completed   DEXA SCAN  Completed   Zoster Vaccines- Shingrix  Completed   HPV VACCINES  Aged Out    Health Maintenance  Health Maintenance Due  Topic Date Due   FOOT EXAM  01/05/2020   COVID-19 Vaccine (6 - 2023-24 season) 01/05/2022   Diabetic kidney evaluation - eGFR measurement  07/21/2022    Colorectal cancer screening: No longer required.   Mammogram status: No longer required due to age and preference.  Bone Density status: Completed 06/24/14. Results reflect: Bone density results: NORMAL. Repeat every 5  years.  Lung Cancer Screening: (Low Dose CT Chest recommended if Age 34-80 years, 20 pack-year currently smoking OR have quit w/in 15years.) does not qualify.   Lung Cancer Screening Referral: n/a  Additional Screening:  Hepatitis C Screening: does not qualify  Vision Screening: Recommended annual ophthalmology exams for early detection of glaucoma and other disorders of the eye. Is the patient up to date with their annual eye exam?  Yes  Who is the provider or what is the name of the office in which the patient attends annual eye exams? [facility]  If pt is not established with a provider, would they like to be referred to a provider to establish care? No .   Dental Screening: Recommended annual dental exams for proper oral hygiene  Diabetic Foot Exam: Diabetic Foot Exam: Overdue, Pt has been advised about the importance in completing this exam. Pt is scheduled for diabetic foot exam on at next office visit.  Community Resource Referral / Chronic Care Management: CRR required this visit?  {YES/NO:21197}  CCM required this visit?  {CCM Required choices:[phone]}     Plan:     I have personally reviewed and noted the following in the patient's chart:   Medical and social history Use of alcohol, tobacco or illicit drugs  Current medications and supplements including opioid prescriptions. {Opioid  Prescriptions:[phone]} Functional ability and status Nutritional status Physical activity Advanced directives List of other physicians Hospitalizations, surgeries, and ER visits in previous 12 months Vitals Screenings to include cognitive, depression, and falls Referrals and appointments  In addition, I have reviewed and discussed with patient certain preventive protocols, quality metrics, and best practice recommendations. A written personalized care plan for preventive services as well as general preventive health recommendations were provided to patient.     Kandis Fantasia Earling, [place]   2/70/6237   After Visit Summary: {CHL AMB AWV After Visit [phone]}  Nurse Notes: ***

## 2022-09-07 ENCOUNTER — Ambulatory Visit: Payer: 59 | Admitting: Student

## 2022-09-07 ENCOUNTER — Other Ambulatory Visit: Payer: Self-pay

## 2022-09-07 VITALS — BP 128/66 | HR 70 | Ht 66.0 in | Wt 154.0 lb

## 2022-09-07 DIAGNOSIS — M25512 Pain in left shoulder: Secondary | ICD-10-CM | POA: Diagnosis not present

## 2022-09-07 DIAGNOSIS — R413 Other amnesia: Secondary | ICD-10-CM | POA: Diagnosis not present

## 2022-09-07 DIAGNOSIS — G3184 Mild cognitive impairment, so stated: Secondary | ICD-10-CM | POA: Diagnosis not present

## 2022-09-07 NOTE — Progress Notes (Unsigned)
  SUBJECTIVE:   CHIEF COMPLAINT / HPI:   Left Shoulder:  -Pain started yesterday  -Did not hurt the arm or have trauma to the area  -Localized to front left shoulder  -No swelling or rash  -Pain is 8/10  -Had similar pain months ago, was told to take tylenol and this helped   Memory Issues  -Ongoing for about 2 weeks  -Was worsening yesterday -Example--Can not remember now when it is time to take her medication  -Able to ambulate, ADLs normal  -Seems like she is forgetting more often -No falls, LOC, syncope, HA. -No alcohol or drug use, no change in mood   PERTINENT  PMH / PSH:  MDD/anxiety HTN HLD T2DM autoimmune hepatitis    Patient Care Team: [name], DO as PCP - General (Family Medicine) Loreli Slot, MD (Cardiothoracic Surgery) Mia Creek, MD (Urgent Care) [place], Judye Bos, PA-C as Physician Assistant (Dermatology) Linna Darner, RD as Dietitian (Family Medicine) Iva Boop, MD as Consulting Physician (Gastroenterology) Annamarie Major, CRNP as Nurse Practitioner (Nurse Practitioner) Martina Sinner, MD as Consulting Physician (Pulmonary Disease) OBJECTIVE:  BP 128/66   Pulse 70   Ht 5\' 6"  (1.676 m)   Wt 154 lb (69.9 kg)   SpO2 100%   BMI 24.86 kg/m  Physical Exam  General: NAD, pleasant, able to participate in exam Card: RRR, no murmur, gallops, rubs  Respiratory: No respiratory distress Skin: warm and dry, no rashes noted Psych: Normal affect and mood Neuro: CN II: PERRL CN III, IV,VI: EOMI CV V: Normal sensation in V1, V2, V3 CVII: Symmetric smile and brow raise CN VIII: Normal hearing CN IX,X: Symmetric palate raise  CN XI: 5/5 shoulder shrug CN XII: Symmetric tongue protrusion  UE and LE strength 5/5 Normal sensation in UE and LE bilaterally  normal heel to shin     ASSESSMENT/PLAN:  Memory loss  Mild cognitive impairment Assessment & Plan: Patient seems to have complained of this in the past, but does have  normal IADLs as well as ADLs and took the bus here.  Patient has no difficulty with getting to her appointments, does report that she is forgetful of her medications.  However, she is alert and oriented x 4 and has no neurologic deficits.  I have instructed her to follow-up with her PCP for MoCA and have ordered labwork reversible causes of cognitive changes   Orders: -     TSH Rfx on Abnormal to Free T4 -     Vitamin B12 -     HIV Antibody (routine testing w rflx) -     RPR -     Comprehensive metabolic panel  Acute pain of left shoulder Assessment & Plan: No trauma to the shoulder making fracture less likely.  Seems related to frozen shoulder given her history and presentation as well as physical exam findings.  However, will maintain broad differential although most likely MSK in nature.  No evidence of systemic symptoms.  Noninfectious.  Showed the patient stretching exercises that I wanted her to do to hopefully have return of range of motion.  Additionally to continue with Tylenol as she has been.    Return for Memory. Alfredo Martinez, MD 09/08/2022, 10:35 AM PGY-3, [facility]

## 2022-09-07 NOTE — Patient Instructions (Addendum)
It was great to see you today! Thank you for choosing [facility] for your primary care. Susan Ingram was seen for follow up.  Today we addressed:   5 frozen shoulder exercises Always warm up your shoulder before performing your exercises. The best way to do that is to take a warm shower or bath for 10 to 15 minutes. You can also use a moist heating pad or damp towel heated in the microwave, but it may not be as effective.  In performing the following exercises, stretch to the point of tension but not pain.  Pendulum stretch illustration of pendulum stretch exercise (Perform this exercise first.)   Relax your shoulders. Stand and lean over slightly, allowing your affected arm to hang down. Swing the arm in a small circle -- about a foot in diameter. Perform 10 revolutions in each direction, once a day. As your symptoms improve, increase the diameter of your swing, but never force it. When you're ready for more, increase the stretch by holding a light weight (three to five pounds) in the swinging arm.  Finger walk  Face a wall three-quarters of an arm's length away. Reach out and touch the wall at waist level with the fingertips of the affected arm. With your elbow slightly bent, slowly walk your fingers up the wall, spider-like, until you've raised your arm to shoulder level, or as far as you comfortably can. Your fingers should be doing the work, not your shoulder muscles. Slowly lower the arm (with the help of the good arm, if necessary) and repeat. Perform this exercise 10 to 20 times a day. Cross-body reach  Sit or stand. Use your good arm to lift your affected arm at the elbow, and bring it up and across your body, exerting gentle pressure to stretch the shoulder. Hold the stretch for 15 to 20 seconds. Do this stretch 10 to 20 times per day.  Take 650-100 mg of Tylenol every 6 hours as needed for pain relief or fever.  Avoid taking more than 3000 mg in a  24-hour period (  This may cause liver damage).  Return with Dr. Royal Piedra to have cognition testing done   If you haven't already, sign up for My Chart to have easy access to your labs results, and communication with your primary care physician.  I recommend that you always bring your medications to each appointment as this makes it easy to ensure you are on the correct medications and helps [place] not miss refills when you need them. Call the clinic at [phone] if your symptoms worsen or you have any concerns.  You should return to our clinic Return for Memory. Please arrive 15 minutes before your appointment to ensure smooth check in process.  We appreciate your efforts in making this happen.  Thank you for allowing me to participate in your care, Alfredo Martinez, MD 09/07/2022, 3:15 PM PGY-3, [facility]

## 2022-09-08 ENCOUNTER — Ambulatory Visit: Payer: 59

## 2022-09-08 ENCOUNTER — Ambulatory Visit: Admission: RE | Admit: 2022-09-08 | Payer: 59 | Source: Ambulatory Visit

## 2022-09-08 DIAGNOSIS — Z1231 Encounter for screening mammogram for malignant neoplasm of breast: Secondary | ICD-10-CM

## 2022-09-08 DIAGNOSIS — M25512 Pain in left shoulder: Secondary | ICD-10-CM | POA: Insufficient documentation

## 2022-09-08 LAB — COMPREHENSIVE METABOLIC PANEL
ALT: 15 IU/L (ref 0–32)
AST: 22 IU/L (ref 0–40)
Albumin: 4.2 g/dL (ref 3.7–4.7)
Alkaline Phosphatase: 126 IU/L — ABNORMAL HIGH (ref 44–121)
BUN/Creatinine Ratio: 14 (ref 12–28)
BUN: 13 mg/dL (ref 8–27)
Bilirubin Total: 0.7 mg/dL (ref 0.0–1.2)
CO2: 27 mmol/L (ref 20–29)
Calcium: 10.6 mg/dL — ABNORMAL HIGH (ref 8.7–10.3)
Chloride: 98 mmol/L (ref 96–106)
Creatinine, Ser: 0.92 mg/dL (ref 0.57–1.00)
Globulin, Total: 2.8 g/dL (ref 1.5–4.5)
Glucose: 109 mg/dL — ABNORMAL HIGH (ref 70–99)
Potassium: 3.4 mmol/L — ABNORMAL LOW (ref 3.5–5.2)
Sodium: 140 mmol/L (ref 134–144)
Total Protein: 7 g/dL (ref 6.0–8.5)
eGFR: 62 mL/min/{1.73_m2} (ref >59)

## 2022-09-08 LAB — VITAMIN B12: Vitamin B-12: 1782 pg/mL — ABNORMAL HIGH (ref 232–1245)

## 2022-09-08 LAB — TSH RFX ON ABNORMAL TO FREE T4: TSH: 0.924 u[IU]/mL (ref 0.450–4.500)

## 2022-09-08 LAB — HIV ANTIBODY (ROUTINE TESTING W REFLEX): HIV Screen 4th Generation wRfx: NONREACTIVE

## 2022-09-08 LAB — RPR: RPR Ser Ql: NONREACTIVE

## 2022-09-08 NOTE — Assessment & Plan Note (Signed)
No trauma to the shoulder making fracture less likely.  Seems related to frozen shoulder given her history and presentation as well as physical exam findings.  However, will maintain broad differential although most likely MSK in nature.  No evidence of systemic symptoms.  Noninfectious.  Showed the patient stretching exercises that I wanted her to do to hopefully have return of range of motion.  Additionally to continue with Tylenol as she has been.

## 2022-09-08 NOTE — Assessment & Plan Note (Signed)
Patient seems to have complained of this in the past, but does have normal IADLs as well as ADLs and took the bus here.  Patient has no difficulty with getting to her appointments, does report that she is forgetful of her medications.  However, she is alert and oriented x 4 and has no neurologic deficits.  I have instructed her to follow-up with her PCP for MoCA and have ordered labwork reversible causes of cognitive changes

## 2022-09-09 NOTE — Progress Notes (Signed)
Elevated Vitamin B12, unknown etiology--ask about supplements at home  K slightly low, recheck  Slightly elevated Ca, recheck  Nonfasting alk phos slight elevation-- recheck  HIV and RPR NR Will call with results.

## 2022-09-09 NOTE — Progress Notes (Signed)
ADDITION to previous note:  Upper Extremity Exam: Limited active ROM of the left to 90 degrees Passive ROM full on left Strength 5/5 LUE  Negative empty can

## 2022-09-10 ENCOUNTER — Encounter: Payer: Self-pay | Admitting: Student

## 2022-09-13 ENCOUNTER — Ambulatory Visit: Payer: Self-pay | Admitting: Student

## 2022-09-13 ENCOUNTER — Ambulatory Visit (INDEPENDENT_AMBULATORY_CARE_PROVIDER_SITE_OTHER): Payer: 59 | Admitting: Student

## 2022-09-13 ENCOUNTER — Other Ambulatory Visit: Payer: Self-pay

## 2022-09-13 VITALS — BP 125/66 | HR 66 | Ht 66.0 in | Wt 153.6 lb

## 2022-09-13 DIAGNOSIS — G3184 Mild cognitive impairment, so stated: Secondary | ICD-10-CM | POA: Diagnosis not present

## 2022-09-13 MED ORDER — MIRTAZAPINE 7.5 MG PO TABS: 7.5000 mg | ORAL_TABLET | Freq: Every day | ORAL | 0 refills | Status: DC

## 2022-09-13 NOTE — Progress Notes (Signed)
    SUBJECTIVE:   CHIEF COMPLAINT / HPI:   Susan Ingram is a very pleasant 82 year old female here with her daughter Susan Ingram to discuss memory concerns.  Lives alone. Cooks and cleans herself.  Susan Ingram also has help to take care of her daughter in the past for her health concerns.  She noticed that she just "doesn't remmeber like she used to."  She says sometimes in conversation, she will forget what she was going to say but will come back to a few minutes later. She forgets where she places things in the house like the keys. She never forgets to leave the stove/oven.  She noticed she eats sometimes and sometimes she doesn't. At dinner, she does not feel that hungry. She likes to eat a big breakfast- she loves sausage with cheesy eggs and grits.  She is worried about her appetite and also says she has been feeling some symptoms of depression.  Her favorite social activity is watching Western movies on TV. She has 4 grandchildren and great-grandbabies that bring her joy.  PERTINENT  PMH / PSH: Hyperlipidemia,  OBJECTIVE:   BP 125/66   Pulse 66   Ht 5\' 6"  (1.676 m)   Wt 153 lb 9.6 oz (69.7 kg)   SpO2 100%   BMI 24.79 kg/m   General: Pleasant, well-appearing, ambulates without assistance Respiratory: Normal work of breathing on room air MSK: Normal muscle tone in all extremities.  No joint deformities.  Rises from seated to standing position without assistance or thrusting. Neuro: Speech clear and fluent.  Good insight and judgment.   ASSESSMENT/PLAN:   Mild cognitive impairment Very pleasant 82 year old female here today with his for her memory changes.  She reviewed TSH, vitamin B12, RPR and other labs collected on 7/23 for reversible causes of memory changes which were all negative. MoCA completed today with score of 12. She is aware of her memory changes and we discussed the difference between cognitive decline and dementia.  Overall, from a physical standpoint she  is doing well and has not had any falls.  She is able to get up from a seated to standing position without assistance or thrusting. -Encouraged her to use engage in brain exercises (since she likes to watch TV and labs jeopardy and will fortune, I encouraged her to continue doing this) and also to lean on her social support system to help with her depressive symptoms. -Started mirtazapine 7.5 mg at nighttime today to aid in her loss of appetite and mood changes. -Provided with advanced directive paperwork and discussed that she and her family should look this over and fill it out.  Discussed difference between DNR and full code. -Follow-up with PCP and also in geriatric clinic     Darral Dash, DO [facility]

## 2022-09-13 NOTE — Patient Instructions (Addendum)
It was great seeing you today.  As we discussed, -You are having memory changes that are common as we age.  You do not yet have dementia as you are able to care for yourself and you are aware of the memory changes. -As we discussed, doing anything that keeps your brain and body active is very good for you.  Continue to enjoy Wheel of Fortune or  [place] - these are good brain exercises  At the front desk, please schedule an appointment in our geriatric clinic as we discussed.  If you have any questions or concerns, please feel free to call the clinic.   Have a wonderful day,  Dr. Darral Dash [facility] [phone]

## 2022-09-13 NOTE — Progress Notes (Deleted)
    SUBJECTIVE:   CHIEF COMPLAINT / HPI:   Forgets time. Digging for things constantly.   PERTINENT  PMH / PSH: MDD/anxiety, HTN, HLD, T2DM, autoimmune hepatitis   OBJECTIVE:   BP 125/66   Pulse 66   Ht 5\' 6"  (1.676 m)   Wt 153 lb 9.6 oz (69.7 kg)   SpO2 100%   BMI 24.79 kg/m  ***   ASSESSMENT/PLAN:   No problem-specific Assessment & Plan notes found for this encounter.     Darral Dash, DO [facility]    {    This will disappear when note is signed, click to select method of visit    :1}

## 2022-09-13 NOTE — Progress Notes (Deleted)
  SUBJECTIVE:   CHIEF COMPLAINT / HPI:   Memory difficulty follow-up: Was seen by Dr. Jena Gauss on 7/23.  PERTINENT  PMH / PSH: MDD/anxiety, HTN, HLD, T2DM, autoimmune hepatitis   Past Medical History:  Diagnosis Date   Acute pharyngitis 02/17/2022   Arthralgia of right temporomandibular joint 10/10/2012   Arthritis    RIGHT KNEE   Asthma    Autoimmune hepatitis (HCC) 07/28/2021   Cancer (HCC)    lung   Cervical stenosis of spinal canal 09/03/2015   MRI 08/2015: Degenerative changes resulting in moderate canal stenosis C3-4, mild to moderate at C4-5 and C5-6.   Neural foraminal narrowing C3-4 thru C6-7, moderate at multiple levels.   Diabetes (HCC) 02/28/2018   Dizzy spells 08/11/2013   Eczema    Elevated alkaline phosphatase level 04/10/2020   Eustachian tube dysfunction 07/23/2016   Heartburn 06/18/2014   Helicobacter pylori gastritis 10/08/2014   History of lung cancer 11/24/2010   S/P Bronchoscopy, left VATS, wedge resection,lingular segmentectomy, lymph node dissection 05/23/2008, [place]. Recently seen by [facility] on 10/02/13.  Negative CT scan. Patient now cured.    History of lung cancer 11/24/2010   S/P Bronchoscopy, left VATS, wedge resection,lingular segmentectomy, lymph node dissection 05/23/2008, [place]. Recently seen by [facility] on 10/02/13.  Negative CT scan. Patient now cured.    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 Physical Exam   ASSESSMENT/PLAN:  There are no diagnoses linked to this encounter. No follow-ups on file. [name], DO 09/13/2022, 8:01 AM PGY-***, [facility] {    This will disappear when note is signed, click to select method of visit    :1}

## 2022-09-13 NOTE — Assessment & Plan Note (Signed)
Very pleasant 82 year old female here today with his for her memory changes.  She reviewed TSH, vitamin B12, RPR and other labs collected on 7/23 for reversible causes of memory changes which were all negative. MoCA completed today with score of 12. She is aware of her memory changes and we discussed the difference between cognitive decline and dementia.  Overall, from a physical standpoint she is doing well and has not had any falls.  She is able to get up from a seated to standing position without assistance or thrusting. -Encouraged her to use engage in brain exercises (since she likes to watch TV and labs jeopardy and will fortune, I encouraged her to continue doing this) and also to lean on her social support system to help with her depressive symptoms. -Started mirtazapine 7.5 mg at nighttime today to aid in her loss of appetite and mood changes. -Provided with advanced directive paperwork and discussed that she and her family should look this over and fill it out.  Discussed difference between DNR and full code. -Follow-up with PCP and also in geriatric clinic

## 2022-09-20 ENCOUNTER — Other Ambulatory Visit: Payer: Self-pay | Admitting: Student

## 2022-09-20 DIAGNOSIS — J392 Other diseases of pharynx: Secondary | ICD-10-CM

## 2022-09-22 ENCOUNTER — Other Ambulatory Visit: Payer: Self-pay | Admitting: Student

## 2022-09-22 DIAGNOSIS — J392 Other diseases of pharynx: Secondary | ICD-10-CM

## 2022-09-27 ENCOUNTER — Other Ambulatory Visit: Payer: Self-pay | Admitting: Student

## 2022-09-27 DIAGNOSIS — J392 Other diseases of pharynx: Secondary | ICD-10-CM

## 2022-09-27 NOTE — Progress Notes (Unsigned)
  SUBJECTIVE:   CHIEF COMPLAINT / HPI:   Throat clearing:  Cognitive impairment:  PERTINENT  PMH / PSH: MDD/anxiety, HTN, HLD, T2DM, autoimmune hepatitis   Patient Care Team: [name], DO as PCP - General (Family Medicine) Loreli Slot, MD (Cardiothoracic Surgery) Mia Creek, MD (Urgent Care) [place], Judye Bos, PA-C as Physician Assistant (Dermatology) Linna Darner, RD as Dietitian (Family Medicine) Iva Boop, MD as Consulting Physician (Gastroenterology) Annamarie Major, CRNP as Nurse Practitioner (Nurse Practitioner) Martina Sinner, MD as Consulting Physician (Pulmonary Disease) OBJECTIVE:  There were no vitals taken for this visit. Physical Exam   ASSESSMENT/PLAN:  There are no diagnoses linked to this encounter. No follow-ups on file. [name], DO 09/27/2022, 8:43 PM PGY-***, [facility] {    This will disappear when note is signed, click to select method of visit    :1}

## 2022-09-28 ENCOUNTER — Other Ambulatory Visit: Payer: Self-pay

## 2022-09-28 ENCOUNTER — Encounter: Payer: Self-pay | Admitting: Student

## 2022-09-28 ENCOUNTER — Ambulatory Visit (INDEPENDENT_AMBULATORY_CARE_PROVIDER_SITE_OTHER): Payer: 59 | Admitting: Student

## 2022-09-28 VITALS — BP 127/73 | HR 74 | Ht 66.0 in | Wt 154.5 lb

## 2022-09-28 DIAGNOSIS — G3184 Mild cognitive impairment, so stated: Secondary | ICD-10-CM

## 2022-09-28 DIAGNOSIS — K219 Gastro-esophageal reflux disease without esophagitis: Secondary | ICD-10-CM

## 2022-09-28 DIAGNOSIS — E119 Type 2 diabetes mellitus without complications: Secondary | ICD-10-CM | POA: Diagnosis not present

## 2022-09-28 MED ORDER — FAMOTIDINE 40 MG PO TABS
40.0000 mg | ORAL_TABLET | Freq: Every day | ORAL | 0 refills | Status: DC
Start: 2022-09-28 — End: 2022-12-22

## 2022-09-28 NOTE — Assessment & Plan Note (Signed)
Reviewed prior labs and notation.  Will recheck CMP for calcium and CBC for anemia.  Advised follow-up in geriatric clinic with Dr. McDiarmid.

## 2022-09-28 NOTE — Patient Instructions (Addendum)
It was great to see you today! Thank you for choosing [facility] for your primary care.  Today we addressed: Memory impairment: I am getting a couple more labs to follow-up on labs checked by Dr. Jena Gauss.  Please make an appointment with our geriatric clinic. Throat clearing: I am prescribing you Pepcid to take daily  If you haven't already, sign up for My Chart to have easy access to your labs results, and communication with your primary care physician. We are checking some labs today. If they are abnormal, I will call you. If they are normal, I will send you a MyChart message (if it is active) or a letter in the mail. If you do not hear about your labs in the next 2 weeks, please call the office. Return in about 3 weeks (around 10/19/2022) for Geriatric clinic with Dr. McDiarmid for memory impairment. Please arrive 15 minutes before your appointment to ensure smooth check in process.  We appreciate your efforts in making this happen.  Thank you for allowing me to participate in your care, [name], DO 09/28/2022, 2:03 PM PGY-3, [facility]

## 2022-09-28 NOTE — Assessment & Plan Note (Signed)
Has been off metformin for several months, corresponding A1c of 7.  Discontinue metformin.  Aim for A1c goal 7-8.

## 2022-09-28 NOTE — Assessment & Plan Note (Signed)
Received benefit from famotidine.  Will represcribe.

## 2022-09-30 ENCOUNTER — Telehealth: Payer: Self-pay | Admitting: Student

## 2022-09-30 NOTE — Telephone Encounter (Signed)
Called patient to discuss labs. Elevated calcium, low potassium. Advised holding chlorthalidone for now until next visit. Recommend BMP be rechecked at that visit. Consider different antihypertensive such as olmesartan if needing hypertension control.

## 2022-10-07 ENCOUNTER — Ambulatory Visit (INDEPENDENT_AMBULATORY_CARE_PROVIDER_SITE_OTHER): Payer: 59 | Admitting: Family Medicine

## 2022-10-07 VITALS — BP 143/86 | HR 77 | Ht 65.55 in | Wt 156.2 lb

## 2022-10-07 DIAGNOSIS — Z748 Other problems related to care provider dependency: Secondary | ICD-10-CM | POA: Diagnosis not present

## 2022-10-07 DIAGNOSIS — F03A18 Unspecified dementia, mild, with other behavioral disturbance: Secondary | ICD-10-CM | POA: Diagnosis not present

## 2022-10-07 DIAGNOSIS — R94120 Abnormal auditory function study: Secondary | ICD-10-CM | POA: Diagnosis not present

## 2022-10-07 DIAGNOSIS — G3184 Mild cognitive impairment, so stated: Secondary | ICD-10-CM

## 2022-10-07 NOTE — Patient Instructions (Signed)
Your memory problems are at mild dementia.   Eat healthy, take exercise, and socialize are the most import things you can do to keep your memory.  Will contact our social worker to see what can be done for your transportation.  Resources for Seniors living in the [employer] of [employer] offered Caregiver Information Helping them learn about and access available support services   bullet Providing caregiver case assistance   bullet Informing caregivers about support groups, counseling services and legal assistance   bullet Referring them to respite care services which can provide a break in caregiving responsibilities   For more information:  Elijah Birk, MSW    Caregiver Support Coordinator                                                                    Email: [email or web address]                                                                                   Call: [facility] & Pura Spice [phone]                                                                                                                       All other areas [phone]     Case Assistance Senior Resources Case Assistance can provide: Short-term problem solving for everyday concerns.  Internal referrals to ot her Senior Resources of [employer] such as Medical illustrator, [employer], [employer] and [employer].   If problems are complex or require intensive care, Senior Resources will help you contact other community agencies.      Elie Confer, Social Worker, BSW [place], Case Retail banker, BSW        From [employer] and [place]:  [phone]     From all other areas:  [phone]          Contact Stephanie [email or web address] Contact [email or web address]    Community Nutrition The Community Nutrition Program provides opportunities for older  adults age 94 and over to enjoy a nutritious noontime meal and participate in field trips, health and wellness activities and educational and cultural programs. The nine Community Nutrition Activity Centers located throughout [facility] are listed below and are open from 9 am to 1 pm, Monday through Friday. Transportation is available. Older adults in [place] also have the option of participating in [employer] of [place]'s  restaurant voucher program. Offered in partnership with the [employer] (located at [address], [place], [place]), the [employer] program introduces increased choice and flexibility to the [employer] Program by providing 12 vouchers each month for older adults to enjoy a meal in a [employer] setting. Participants must be age 36 or older, have their own transportation and agree to participate in health and wellness workshops held at [employer] twice quarterly.                                                                                                                             Ferdinand Cava, Community Nutrition Program Assistant          From [facility] and [place]: [phone]      From all other areas: [phone]        [email or web address]    Mobile Meals The Mobile Meals Program delivers a nutritious noontime meal to homebound senior adults age 17 and over. To be eligible to receive Mobile Meals, the seniors must be unable to prepare a meal for themselves or have no one available to assist them. Volunteers from the faith community, local businesses and schools work in pairs to deliver the meals, which are prepared by a Sales promotion account executive. The volunteers also check on the well-being of each senior they visit.   Arvin Collard, Mobile Meals Program Director Annalee Genta, Mobile Meals Program Assistant Blair Heys, Volunteer Coordinator      From [employer] and [place]: [phone]      From all other areas: [phone]        [email or web address]    6 for the [place] office or [phone] to learn more from the Pathways and Protocol program.      Retired Herbalist The Retired Herbalist (RSVP) Facilities manager opportunities for adults age 46 and over. Volunteers benefit from being involved in regular activities and have the opportunity to learn new skills and share their life experiences. They may request mileage and meal reimbursement and qualify for supplemental accident and liability insurance. The Retired Herbalist is funded by [employer] for [employer] and [employer], [employer], and is sponsored in [employer] by [employer] of [employer].   Zacarias Pontes, Retired Production assistant, radio Tora Duck, Retired Forensic psychologist      From [employer] & [place]: [phone]     From all other areas: [phone]       [email or web address]   Rural [employer] activities provide senior adults living in [place], [place], [place], [place], [place] and [address] with a variety of programmed activities which support the independent living of seniors in their home communities. Rural Scientist, research (life sciences) meets at: [facility] meets at: [facility] [employer]  Garden meets at: [employer] [place] meets at: [employer] meets at: [employer] are held at: [employer], and [employer]    [place], [place] Outreach Site Manager Amy Eulah Pont, Rural Outreach Coordinator      From [employer] and [place]:  [phone]    From all other areas:  [phone]       [email or web address]   Senior Center The [employer] is located in the [employer] Building in downtown [place]. The Center is a place where senior adults age 34 and over can get together for recreation, participate in field trips, enjoy cultural presentations, engage in volunteer activities and may enjoy a nutritious noon time meal. Individuals age 18-59 may participate in some activities, for a fee. The Center is open Monday - Friday. Afternoon activities include: computer training, painting, knitting classes and exercise programs, including Tai Chi. The [facility] is certified by the [employer] of Aging as a Engineer, manufacturing systems.  Theola Sequin, Senior Center Director [employer], Activities Assistant       [phone]         [email or web address]       [employer] Conservator, museum/gallery Resources - [employer] [address]  [place], [place] [address] [phone]     [facility] [address]  [place], [place] [address] [phone]     [facility] [address] [place], [place] [address] [phone]          [facility]    [facility] [address] [place], [place] [address] [phone]    [facility] [address] [place], [place] [address] [phone]    Dennie Maizes Reitzel [employer] [address] [place], [place] [address] [phone]    [facility] [address] [place], [place] [address] [phone]     [facility] CENTERS    Garden of [facility] [address] [place], [place] [address] [phone]      [facility] [address] [place] [address] [place], [place] [address] [phone]     Center for Active Retirement  [address] [place], [place] [address] [phone]      [facility] SENIOR CENTERS    Joyce Copa [facility] Senior  Activities Center [address] [place], [place] [address] [phone]           [facility]    [facility] [address] [facility] [address]  [place], [place] [address] [phone]    [facility] [address] [place], [place] [address] [phone]

## 2022-10-08 ENCOUNTER — Encounter: Payer: Self-pay | Admitting: Family Medicine

## 2022-10-08 NOTE — Progress Notes (Signed)
Provider:  Acquanetta Belling, MD Location:      Place of Service:     PCP: [name], DO Patient Care Team: [name], DO as PCP - General (Family Medicine) Loreli Slot, MD (Cardiothoracic Surgery) Mia Creek, MD (Urgent Care) Sheffield, Judye Bos, PA-C as Physician Assistant (Dermatology) Linna Darner, RD as Dietitian (Family Medicine) Iva Boop, MD as Consulting Physician (Gastroenterology) Annamarie Major, CRNP as Nurse Practitioner (Nurse Practitioner) Martina Sinner, MD as Consulting Physician (Pulmonary Disease)  Extended Emergency Contact Information Primary Emergency Contact: [facility] Address: [address]          [place], [place] [address] [place] Phone: [phone] Relation: Daughter Secondary Emergency Contact: [place] Mobile Phone: [phone] Relation: Granddaughter  Code Status: DNR Goals of Care: Advanced Directive information    10/07/2022    2:27 PM  Advanced Directives  Does Patient Have a Medical Advance Directive? No  Would patient like information on creating a medical advance directive? No - Patient declined     [facility]:   Patient is accompanied by Armanda Heritage (daughter) Primary caregiver:  Some assistance from daughter, Rinaldo Cloud and granddgt, Lillia Mountain Patient's Currently living arrangement:  daughter recently moved in with patient Patient information was obtained from imaging reports: Head CT, lab reports, and office notes  patient and relative(s). History/Exam limitations:  interpersonal conflict between the patient and her daughter during the history Primary Care Provider:   [name], DO Referring provider:  [name] Reason for referral:  memory  difficulites  ----------------------------------------------------------------------------------------------------------------------------------------------------------------------------------------------------------------------------------------------------------------   HPI by problems:  Chief Complaint  Patient presents with   Memory Loss    Cognitive impairment concern  Are there problems with thinking?  Cognition domains: Memory difficulties  When were the changes first noticed?  months  Did this change occur abruptly or gradually?  abrupt, after CoViD-19 infection a couple months ago  How have the changes progressed since then?  staying constant  Has there been any tremors or abnormal movements?  no  Have they had in hallucinations or delusions:  no  Have they appeared more anxious or sad lately?  yes  Do they still have interests or activities they enjor doing?  yes  How has their appetite been lately?  show no change  How has their sleep been lately?  No difficulties.  Naps rarely.      Compared to 5 to 10 years ago, how is the patient at:  Problems with Judgment, e.g., problem making decisions, bad financial decisions, problems with thinking?  Patient denies difficulty.  Her daughter is concerned her mother is not taking appropriate safety precautions concealing her money, debit card and bus pass when riding the bus.  Less interested in hobbies or previously enjoyed activities?  no, Ms Adams feels her daughter's restrictions on her previous enjoyed activity of riding the bus independently to various locations in town has not allowed her to pursue her usual interests.   Problem remembering things about family and friends e.g. names,  occupations, birthdays, addresses?  no  Problem remembering conversations or news events a few days later?  no, thought daughter reports patient frequently asking her the same questions repeatedly  Problem  remembering what day and month it is? no  Problem with losing things?  no  Problem learning to use a new gadget or machine around the house, e.g., cell phones, computer, microwave, remote control?  yes  Problem with handling  money for shopping?  no, see daughter's concern above  Problem handling financial matters, e.g. their pension, checking, credit cards, dealing with the bank?  Ms Salts reports her granddaughter has taken over paying her bills, but that she does not think this is necessary.  Neither reported said patient had missed paying bills or paying same bill repeatedly.   Problem with getting lost in familiar places?  no   Geriatric Depression Scale:  8 / 15 (high)   PHQ-9: Flowsheet Row Office Visit from 09/07/2022 in [facility]  PHQ-9 Total Score 8        Outpatient Encounter Medications as of 10/07/2022  Medication Sig   atorvastatin (LIPITOR) 40 MG tablet Take 1 tablet (40 mg total) by mouth daily.   cetirizine (ZYRTEC) 10 MG tablet Take 5 mg by mouth daily.   chlorthalidone (HYGROTON) 25 MG tablet Take 25 mg by mouth daily.   famotidine (PEPCID) 40 MG tablet Take 1 tablet (40 mg total) by mouth daily.   fluticasone (FLONASE) 50 MCG/ACT nasal spray Place 1 spray into both nostrils daily.   METFORMIN HCL PO Take by mouth. Does not take everday   mirtazapine (REMERON) 7.5 MG tablet Take 1 tablet (7.5 mg total) by mouth at bedtime.   [DISCONTINUED] potassium chloride (K-DUR) 10 MEQ tablet Take 1 tablet (10 mEq total) by mouth daily.   No facility-administered encounter medications on file as of 10/07/2022.    History Patient Active Problem List   Diagnosis Date Noted   Autoimmune hepatitis (HCC) 07/28/2021    Priority: High   Type 2 diabetes mellitus without complications (HCC) 03/16/2019    Priority: High   Major depressive disorder, recurrent episode (HCC) 02/11/2016    Priority: High   GERD (gastroesophageal reflux disease)  09/28/2022    Priority: Medium    Hepatic fibrosis 09/02/2021    Priority: Medium    Dementia, mild 04/09/2019    Priority: Medium    Generalized anxiety disorder 05/11/2018    Priority: Medium    Hypertension 02/18/2012    Priority: Medium    Hyperlipidemia     Priority: Medium    Hepatic steatosis 10/04/2020    Priority: Low   Knee pain, bilateral 04/09/2014    Priority: Low   Abnormal hearing screen, right ear 10/07/2022   Allergic rhinitis 04/22/2015   Asthma 02/18/2012   Past Medical History:  Diagnosis Date   Acute pharyngitis 02/17/2022   Arthralgia of right temporomandibular joint 10/10/2012   Arthritis    RIGHT KNEE   Asthma    Autoimmune hepatitis (HCC) 07/28/2021   Cervical stenosis of spinal canal 09/03/2015   MRI 08/2015: Degenerative changes resulting in moderate canal stenosis C3-4, mild to moderate at C4-5 and C5-6.   Neural foraminal narrowing C3-4 thru C6-7, moderate at multiple levels.   Cholelithiasis without cholecystitis 10/04/2020   Diagnosed on RUQ [place] 10/02/20 as part of evaluation for elevated alk phos.      Diabetes (HCC) 02/28/2018   Dizzy spells 08/11/2013   Eczema    Elevated alkaline phosphatase level 04/10/2020   Eustachian tube dysfunction 07/23/2016   Heartburn 06/18/2014   Helicobacter pylori gastritis 10/08/2014   History of lung cancer 11/24/2010   S/P Bronchoscopy, left VATS, wedge resection,lingular segmentectomy, lymph node dissection 05/23/2008, [facility]. Recently seen by [facility] on 10/02/13.  Negative CT scan. Patient now cured.    History of lung cancer 11/24/2010   S/P Bronchoscopy, left VATS, wedge resection,lingular segmentectomy, lymph node dissection 05/23/2008,  [facility]. Recently [facility] by [facility] on 10/02/13.  Negative CT scan. Patient now cured.    History of vitamin D deficiency 02/18/2012   Hypercholesterolemia    Hypertension    Hypertension associated with diabetes (HCC)    Hypokalemia 04/10/2020   Medicare  annual wellness visit, subsequent 12/02/2021   Memory loss 03/16/2019   Otalgia of both ears 01/03/2015   Renal cyst 06/24/2018   CT AP 2022: Bosniak category 1 and 2 right kidney lesions.     Retina disorder, left 04/22/2015   Seborrheic keratosis 09/03/2015   Throat clearing 09/15/2013   Weight loss 06/24/2018   Past Surgical History:  Procedure Laterality Date   Bronchoscopy, left video-assisted thoracoscopy, wedge  05/23/2008   Left thyroid lobectomy, frozen section.  10/11/2002   VAGINAL HYSTERECTOMY  1966   in her 20's    WRIST SURGERY Left 02/15/2017   For carpal tunnel? 2019 is estimate   Family History  Problem Relation Age of Onset   Early death Mother    Hypertension Mother    Stroke Mother    Alzheimer's disease Father    Diabetes Brother    Breast cancer Maternal Aunt    Breast cancer Cousin    Colon cancer Neg Hx    Social History   Socioeconomic History   Marital status: Divorced    Spouse name: Not on file   Number of children: 1   Years of education: 12   Highest education level: 12th grade  Occupational History   Occupation: Retired  Tobacco Use   Smoking status: Former    Current packs/day: 0.00    Average packs/day: 2.0 packs/day for 30.0 years (60.0 ttl pk-yrs)    Types: Cigarettes    Start date: 04/16/1963    Quit date: 04/15/1993    Years since quitting: 29.5   Smokeless tobacco: Never  Vaping Use   Vaping status: Never Used  Substance and Sexual Activity   Alcohol use: No   Drug use: No   Sexual activity: Not Currently  Other Topics Concern   Not on file  Social History Narrative   Patient lives alone in [place].   Patient has one daughter and 7 grandchildren.    Patient typically rides the bus for transportation, however her grandchildren will help her get around as needed.   Patient enjoys watching westerns on TV, reading the paper and reading books.    Patient does not have an active work out plan, however she stays "on the move  all the time."    Income is from [employer] Disability    Social Determinants of Health   Financial Resource Strain: Low Risk  (05/20/2020)   Overall Financial Resource Strain (CARDIA)    Difficulty of Paying Living Expenses: Not hard at all  Food Insecurity: Low Risk  (04/26/2022)   [facility]   Food vital sign    Within the past 12 months, you worried that your food would run out before you got money to buy more: Never true    Within the past 12 months, the food you bought just didn't last and you didn't have money to get more. : Never true  Transportation Needs: Unmet Transportation Needs (10/08/2022)   PRAPARE - Administrator, Civil Service (Medical): Yes    Lack of Transportation (Non-Medical): No  Physical Activity: Inactive (05/20/2020)   Exercise Vital Sign    Days of Exercise per Week: 0 days    Minutes  of Exercise per Session: 0 min  Stress: Stress Concern Present (05/20/2020)   [employer] of Occupational Health - Occupational Stress Questionnaire    Feeling of Stress : To some extent  Social Connections: Unknown (10/08/2022)   Social Connection and Isolation Panel [NHANES]    Frequency of Communication with Friends and Family: More than three times a week    Frequency of Social Gatherings with Friends and Family: Not on file    Attends Religious Services: Never    Database administrator or Organizations: No    Attends Engineer, structural: Not on file    Marital Status: Not on file      Cardiovascular Risk Factors: Hypertension, Lipids, and NIDDM  Educational History: 12 years formal education - No HS diploma or GED Personal History of Seizures: No -  Personal History of Stroke: Yes -  Personal History of Head Trauma: No -  Personal History of Psychiatric Disorders: No -  Family History of Dementia: Yes - Mother, Aunt   Basic Activities of Daily Living  Dressing: Self-care Eating: Self-care Ambulation:  Self-care Toileting: Self-care Bathing: Self-care  Instrumental Activities of Daily Living Shopping: Self-care House/Yard Work: Self-care Administration of medications: Self-care Finances: Total assistance Telephone: Self-care Transportation: Partial assistance  Caregivers in home: daughter is in home.  Daughter perceives herself as providing care for her mother.  The patient sees herself as not requiring assistance   Formal Home Health Assistance  Physical Therapy: no  Occupational Therapy: no             Home Aid / Personal Care Service: yes             Homemaker services: no  FALLS in last five office visits:     09/28/2022    1:26 PM 09/13/2022   10:38 AM 09/07/2022    2:56 PM 07/01/2022    3:59 PM 02/17/2022    1:24 PM  Fall Risk   Falls in the past year? 0 0 0 0 0  Number falls in past yr: 0 0 0 0 0  Injury with Fall? 0 0 0 0 0    Health Maintenance reviewed: Immunization History  Administered Date(s) Administered   Fluad Quad(high Dose 65+) 12/24/2019   Hepb-cpg 08/03/2021, 09/04/2021   Influenza Split 02/18/2012   Influenza,inj,Quad PF,6+ Mos 12/27/2012, 12/12/2013, 12/09/2014, 12/01/2015, 11/16/2016, 11/10/2017, 10/25/2018, 11/10/2020   Influenza-Unspecified 11/10/2021   Moderna Sars-Covid-2 Vaccination 03/31/2019, 04/28/2019, 01/15/2020   Pfizer Covid-19 Vaccine Bivalent Booster 18yrs & up 01/21/2021   Pneumococcal Conjugate-13 01/02/2014   Pneumococcal Polysaccharide-23 07/15/2016   Tdap 03/24/2017   Unspecified SARS-COV-2 Vaccination 11/10/2021   Zoster Recombinant(Shingrix) 06/13/2017, 08/11/2017   Health Maintenance Topics with due status: Overdue     Topic Date Due   FOOT EXAM 01/05/2020   Health Maintenance Topics with due status: Due On     Topic Date Due   INFLUENZA VACCINE 09/16/2022    Diet: Regular   Geriatric Syndromes: Constipation yes  Incontinence no  Nocturia: no Dizziness yes - occasionally with standing  Syncope no  Visual  Impairment no   Hearing impairment no Dentures problems: no Impaired Memory or Cognition yes   Sleep problems no   Weight loss no Drug Misadventure: no   Joint pain: yes, bilateral knees   Vital Signs Weight: 156 lb 4 oz (70.9 kg) Body mass index is 25.57 kg/m. Estimated Creatinine Clearance: 53.2 mL/min (by C-G formula based on SCr of 0.79 mg/dL). Body surface area is  1.81 meters squared. Vitals:   10/07/22 1427 10/07/22 1550  BP: (!) 150/76 (!) 143/86  Pulse: 77   SpO2: 100%   Weight: 156 lb 4 oz (70.9 kg)   Height: 5' 5.55" (1.665 m)    Wt Readings from Last 3 Encounters:  10/07/22 156 lb 4 oz (70.9 kg)  09/28/22 154 lb 8.6 oz (70.1 kg)  09/13/22 153 lb 9.6 oz (69.7 kg)   Hearing Screening   250Hz  500Hz  1000Hz  2000Hz  4000Hz   Right ear 20 20 40 40 Fail  Left ear 25 20 40 20 20   Vision Screening   Right eye Left eye Both eyes  Without correction     With correction 20/20 20/20 20/20     Physical Examination:  VS reviewed Physical Exam  Vitals:   10/07/22 1427 10/07/22 1550  BP: (!) 150/76 (!) 143/86  Pulse: 77   SpO2: 100%           12/02/2021   10:33 AM 05/20/2020    9:09 AM  6CIT Screen  What Year? 0 points 0 points  What month? 0 points 0 points  What time? 0 points 0 points  Count back from 20 0 points 4 points  Months in reverse 4 points 2 points  Repeat phrase 0 points 0 points  Total Score 4 points 6 points        No data to display                 04/09/2019   10:26 AM  Montreal Cognitive Assessment   Visuospatial/ Executive (0/5) 1  Naming (0/3) 3  Attention: Read list of digits (0/2) 2  Attention: Read list of letters (0/1) 1  Attention: Serial 7 subtraction starting at 100 (0/3) 0  Language: Repeat phrase (0/2) 0  Language : Fluency (0/1) 0  Abstraction (0/2) 0  Delayed Recall (0/5) 0  Orientation (0/6) 6  Total 13  Adjusted Score (based on education) 14     Labs No components found for: "VITAMIND"  Lab Results   Component Value Date   VITAMINB12 1,782 (H) 09/07/2022    Lab Results  Component Value Date   FOLATE 8.0 08/09/2017    Lab Results  Component Value Date   TSH 0.924 09/07/2022    No results found for: "RPR"  Lab Results  Component Value Date   HIV Non Reactive 09/07/2022      Chemistry      Component Value Date/Time   NA 143 09/28/2022 1614   K 2.9 (L) 09/28/2022 1614   CL 100 09/28/2022 1614   CO2 28 09/28/2022 1614   BUN 16 09/28/2022 1614   CREATININE 0.79 09/28/2022 1614   CREATININE 0.82 02/11/2016 0912      Component Value Date/Time   CALCIUM 10.7 (H) 09/28/2022 1614   ALKPHOS 136 (H) 09/28/2022 1614   AST 37 09/28/2022 1614   ALT 32 09/28/2022 1614   BILITOT 0.9 09/28/2022 1614       Estimated Creatinine Clearance: 53.2 mL/min (by C-G formula based on SCr of 0.79 mg/dL).   Lab Results  Component Value Date   HGBA1C 7.0 08/30/2022     @10RELATIVEDAYS @ Hearing Screening   250Hz  500Hz  1000Hz  2000Hz  4000Hz   Right ear 20 20 40 40 Fail  Left ear 25 20 40 20 20   Vision Screening   Right eye Left eye Both eyes  Without correction     With correction 20/20 20/20 20/20    Lab Results  Component  Value Date   WBC 5.6 09/28/2022   HGB 13.0 09/28/2022   HCT 39.3 09/28/2022   MCV 85 09/28/2022   PLT 248 09/28/2022    No results found for this or any previous visit (from the past 24 hour(s)).  Imaging Head CT: Unremarkable 2019  Advanced Directives Code Status: unaddressed Advance Directives:  unaddressed    ------------------------------------------------------------------------------------------------------------------------------------------------------------------------------------------------------------------------------------------------------------------------------------------------------------------------------------------------------------------------------------------------------------------------------------------------------------------------------------------------------------  Assessment and Plan: Please see individual consultation notes from physical therapy, pharmacy and social work for today.    Problem List Items Addressed This Visit       Medium    Dementia, mild - Primary    Evidence of progression of cognitive and functional impairments  Mild stage of Dementia with resultant impaired abilities instrumental activities of daily living finances and with preserved basic activities of daily living.  Counseled patient and family regarding the diagnosis of dementia. Provided packet of materials assembled for patients and families with more information about dementia and resources to assist with coping with impairments and behaviors.  Recommend social services consultation for community resources including the [employer] of [place], adults day care programs, caregiver support.    Referral made to [facility]) to help with transportation difficulties.          Unprioritized   Abnormal hearing screen, right ear   Other Visit Diagnoses     Assistance needed with transportation       Relevant Orders   AMB Referral to Community Care Coordinaton ([facility] Patients)       Time for precharting, visit and documentation 44 minutes  Primary Contact: Extended Emergency Contact Information Primary Emergency Contact: [facility] Address: [address]          [place], [place] [address] [place] Phone:  [phone] Relation: Daughter Secondary Emergency Contact: [place] Mobile Phone: [phone] Relation: Granddaughter

## 2022-10-08 NOTE — Assessment & Plan Note (Signed)
Evidence of progression of cognitive and functional impairments  Mild stage of Dementia with resultant impaired abilities instrumental activities of daily living finances and with preserved basic activities of daily living.  Counseled patient and family regarding the diagnosis of dementia. Provided packet of materials assembled for patients and families with more information about dementia and resources to assist with coping with impairments and behaviors.  Recommend social services consultation for community resources including the [employer] of [place], adults day care programs, caregiver support.    Referral made to [facility]) to help with transportation difficulties.

## 2022-10-11 ENCOUNTER — Telehealth: Payer: Self-pay | Admitting: *Deleted

## 2022-10-11 NOTE — Progress Notes (Signed)
  Care Coordination   Note   10/11/2022 Name: Susan Ingram MRN: [id number] DOB: 11/12/1940  Susan Ingram is a 82 y.o. year old female who sees [name], DO for primary care. I reached out to Susan Ingram by phone today to offer care coordination services.  Susan Ingram was given information about Care Coordination services today including:   The Care Coordination services include support from the care team which includes your Nurse Coordinator, Clinical Social Worker, or Pharmacist.  The Care Coordination team is here to help remove barriers to the health concerns and goals most important to you. Care Coordination services are voluntary, and the patient may decline or stop services at any time by request to their care team member.   Care Coordination Consent Status: Patient agreed to services and verbal consent obtained.   Follow up plan:  Telephone appointment with care coordination team member scheduled for:  10/15/22  Encounter Outcome:  Pt. Scheduled  [facility] Coordination Care Guide  Direct Dial: [phone]

## 2022-10-15 ENCOUNTER — Ambulatory Visit: Payer: Self-pay

## 2022-10-15 NOTE — Patient Instructions (Signed)
Visit Information  Thank you for taking time to visit with me today. Please don't hesitate to contact me if I can be of assistance to you.   Following are the goals we discussed today:  - Work with SW to identify resources for transportation  If you are experiencing a Mental Health or Behavioral Health Crisis or need someone to talk to, please call 1-800-273-TALK (toll free, 24 hour hotline) go to [facility] [address], [place] [phone]) call 911  The patient verbalized understanding of instructions, educational materials, and care plan provided today and DECLINED offer to receive copy of patient instructions, educational materials, and care plan.   Bevelyn Ngo, BSW, CDP Social Worker, Certified Dementia Practitioner [facility]  Care Coordination [phone]

## 2022-10-15 NOTE — Patient Outreach (Signed)
  Care Coordination   Initial Visit Note   10/15/2022 Name: Nori Sheasley MRN: [id number] DOB: 1940/06/05  Verner Mould is a 82 y.o. year old female who sees [name], DO for primary care. I spoke with  Verner Mould by phone today.  What matters to the patients health and wellness today?  Identify resources for transportation    Goals Addressed             This Visit's Progress    Care Coordination Activities       Care Coordination Interventions: Discussed the patient needs assistance with transportation resources. Patient reports she [place] [employer] [place] and it is sometimes difficult to walk to and from the bus stops Performed chart review to note patient has Medicare and Medicaid Education provided on patients health plan transportation benefit - patient reports she is aware of this benefit but feels it is not reliable and therefore is not interested in utilizing it at this time Education provided on the opportunity to apply for [employer] for transportation that will pick her up from her home and take her to provider appointments Determined the patient is concerned about the cost of [employer]. She reports she currently pays 50 cents on the fixed route and does not want to pay more Advised the patient the fare cost with [facility] is listed as $2.50 on the website; SW to [employer] to inquire if patient qualifies for a reduced rate Collaboration with Lorretta Harp requesting feedback on fare rate for riders with both Medicare and Medicaid         SDOH assessments and interventions completed:  Yes  SDOH Interventions Today    Flowsheet Row Most Recent Value  SDOH Interventions   Food Insecurity Interventions Intervention Not Indicated  Housing Interventions Intervention Not Indicated  Transportation Interventions Other (Comment), Payor Benefit  [Pt [place] [employer] [place],  knows  about health plan transportation but does not want to use. Education on [employer]]  Utilities Interventions Intervention Not Indicated        Care Coordination Interventions:  Yes, provided   Interventions Today    Flowsheet Row Most Recent Value  Chronic Disease   Chronic disease during today's visit Diabetes, Hypertension (HTN), Other  [Dementia, transportation barriers]  General Interventions   General Interventions Discussed/Reviewed General Interventions Discussed, [employer], Communication with  [Contacted Courtney Rorie with [employer] to determine fare costs]  Education Interventions   Education Provided Provided Education  Provided Verbal Education On [employer], [employer]  [Related to transportation]        Follow up plan:  SW will continue to follow    Encounter Outcome:  Pt. Visit Completed   Bevelyn Ngo, Kenard Gower, CDP Social Worker, Certified Dementia Practitioner [facility] Care Management  Care Coordination [phone]

## 2022-10-21 ENCOUNTER — Ambulatory Visit: Payer: Self-pay

## 2022-10-21 ENCOUNTER — Other Ambulatory Visit: Payer: Self-pay | Admitting: Student

## 2022-10-21 NOTE — Patient Outreach (Signed)
  Care Coordination   Follow Up Visit Note   10/21/2022 Name: Susan Ingram MRN: [id number] DOB: September 17, 1940  Susan Ingram is a 82 y.o. year old female who sees [name], DO for primary care. I spoke with  Susan Ingram by phone today.  What matters to the patients health and wellness today?  The patient is interested in learning more about transportation resources available to her.    Goals Addressed             This Visit's Progress    COMPLETED: Care Coordination Activities       Care Coordination Interventions: Discussed the fare to use [employer] is $2.50 per trip with no programs for discounts Determined the patient prefers to continue using the fixed route as it only costs 50 cents Reviewed the patient continues to have her health plan transportation benefit should she change her mind and want to use this service Assessed for ongoing SW support needs - patient declines any other needs at this time        SDOH assessments and interventions completed:  No     Care Coordination Interventions:  Yes, provided   Interventions Today    Flowsheet Row Most Recent Value  Chronic Disease   Chronic disease during today's visit Diabetes, Hypertension (HTN), Other  [Transportation Barriers]  General Interventions   General Interventions Discussed/Reviewed General Interventions Reviewed, Communication with, [employer]  [Communication with [employer] with [employer] who indicates there are no discounts to ADA transportation services]        Follow up plan: No further intervention required.   Encounter Outcome:  Patient Visit Completed   Bevelyn Ngo, Kenard Gower, CDP Social Worker, Certified Dementia Practitioner [facility] Care Management  Care Coordination [phone]

## 2022-10-21 NOTE — Patient Instructions (Signed)
Visit Information  Thank you for taking time to visit with me today. Please don't hesitate to contact me if I can be of assistance to you.   Following are the goals we discussed today:  - Continue using the [facility] bus for transportation services - Contact your health plan to arrange transportation to medical appointments as desired - Contact your primary care provider as needed   If you are experiencing a Mental Health or Behavioral Health Crisis or need someone to talk to, please call 1-800-273-TALK (toll free, 24 hour hotline) go to [facility] [address], [place] [phone]) call 911  The patient verbalized understanding of instructions, educational materials, and care plan provided today and DECLINED offer to receive copy of patient instructions, educational materials, and care plan.   No further follow up required: Please contact me as needed  Bevelyn Ngo, BSW, CDP Social Worker, Certified Dementia Practitioner [facility] Care Management  Care Coordination [phone]

## 2022-11-10 ENCOUNTER — Ambulatory Visit (INDEPENDENT_AMBULATORY_CARE_PROVIDER_SITE_OTHER): Payer: 59 | Admitting: *Deleted

## 2022-11-10 DIAGNOSIS — Z0000 Encounter for general adult medical examination without abnormal findings: Secondary | ICD-10-CM | POA: Diagnosis not present

## 2022-11-10 NOTE — Progress Notes (Signed)
Subjective:   Susan Ingram is a 82 y.o. female who presents for Medicare Annual (Subsequent) preventive examination.  Visit Complete: Virtual  I connected with  Verner Mould on 11/10/22 by a audio enabled telemedicine application and verified that I am speaking with the correct person using two identifiers.  Patient Location: Home  Provider Location: Home Office  I discussed the limitations of evaluation and management by telemedicine. The patient expressed understanding and agreed to proceed.  Because this visit was a virtual/telehealth visit, some criteria may be missing or patient reported. Any vitals not documented were not able to be obtained and vitals that have been documented are patient reported.    Cardiac Risk Factors include: advanced age (>21men, >53 women);hypertension;diabetes mellitus     Objective:    There were no vitals filed for this visit. There is no height or weight on file to calculate BMI.     11/10/2022    1:02 PM 10/07/2022    2:27 PM 08/30/2022   10:52 AM 07/01/2022    4:00 PM 05/28/2022    1:30 PM 02/17/2022    1:24 PM 12/02/2021   11:16 AM  Advanced Directives  Does Patient Have a Medical Advance Directive? No No No No No No No  Would patient like information on creating a medical advance directive?  No - Patient declined    No - Patient declined Yes (MAU/Ambulatory/Procedural Areas - Information given)    Current Medications (verified) Outpatient Encounter Medications as of 11/10/2022  Medication Sig   atorvastatin (LIPITOR) 40 MG tablet Take 1 tablet (40 mg total) by mouth daily.   cetirizine (ZYRTEC) 10 MG tablet Take 5 mg by mouth daily.   chlorthalidone (HYGROTON) 25 MG tablet Take 25 mg by mouth daily.   famotidine (PEPCID) 40 MG tablet Take 1 tablet (40 mg total) by mouth daily.   fluticasone (FLONASE) 50 MCG/ACT nasal spray Place 1 spray into both nostrils daily.   METFORMIN HCL PO Take by mouth. Does not take everday    mirtazapine (REMERON) 7.5 MG tablet TAKE 1 TABLET BY MOUTH AT BEDTIME   [DISCONTINUED] potassium chloride (K-DUR) 10 MEQ tablet Take 1 tablet (10 mEq total) by mouth daily.   No facility-administered encounter medications on file as of 11/10/2022.    Allergies (verified) Amlodipine, Maxzide [triamterene-hctz], and Toprol xl [metoprolol tartrate]   History: Past Medical History:  Diagnosis Date   Acute pharyngitis 02/17/2022   Arthralgia of right temporomandibular joint 10/10/2012   Arthritis    RIGHT KNEE   Asthma    Autoimmune hepatitis (HCC) 07/28/2021   Cervical stenosis of spinal canal 09/03/2015   MRI 08/2015: Degenerative changes resulting in moderate canal stenosis C3-4, mild to moderate at C4-5 and C5-6.   Neural foraminal narrowing C3-4 thru C6-7, moderate at multiple levels.   Cholelithiasis without cholecystitis 10/04/2020   Diagnosed on RUQ [place] 10/02/20 as part of evaluation for elevated alk phos.      Diabetes (HCC) 02/28/2018   Dizzy spells 08/11/2013   Eczema    Elevated alkaline phosphatase level 04/10/2020   Eustachian tube dysfunction 07/23/2016   Heartburn 06/18/2014   Helicobacter pylori gastritis 10/08/2014   History of lung cancer 11/24/2010   S/P Bronchoscopy, left VATS, wedge resection,lingular segmentectomy, lymph node dissection 05/23/2008, [place]. Recently seen by [facility] on 10/02/13.  Negative CT scan. Patient now cured.    History of lung cancer 11/24/2010   S/P Bronchoscopy, left VATS, wedge resection,lingular segmentectomy, lymph node dissection 05/23/2008, [place].  Recently seen by [facility] on 10/02/13.  Negative CT scan. Patient now cured.    History of vitamin D deficiency 02/18/2012   Hypercholesterolemia    Hypertension    Hypertension associated with diabetes (HCC)    Hypokalemia 04/10/2020   Medicare annual wellness visit, subsequent 12/02/2021   Memory loss 03/16/2019   Otalgia of both ears 01/03/2015   Renal cyst 06/24/2018    CT AP 2022: Bosniak category 1 and 2 right kidney lesions.     Retina disorder, left 04/22/2015   Seborrheic keratosis 09/03/2015   Throat clearing 09/15/2013   Weight loss 06/24/2018   Past Surgical History:  Procedure Laterality Date   Bronchoscopy, left video-assisted thoracoscopy, wedge  05/23/2008   Left thyroid lobectomy, frozen section.  10/11/2002   VAGINAL HYSTERECTOMY  1966   in her 20's    WRIST SURGERY Left 02/15/2017   For carpal tunnel? 2019 is estimate   Family History  Problem Relation Age of Onset   Early death Mother    Hypertension Mother    Stroke Mother    Alzheimer's disease Father    Diabetes Brother    Breast cancer Maternal Aunt    Breast cancer Cousin    Colon cancer Neg Hx    Social History   Socioeconomic History   Marital status: Divorced    Spouse name: Not on file   Number of children: 1   Years of education: 12   Highest education level: 12th grade  Occupational History   Occupation: Retired  Tobacco Use   Smoking status: Former    Current packs/day: 0.00    Average packs/day: 2.0 packs/day for 30.0 years (60.0 ttl pk-yrs)    Types: Cigarettes    Start date: 04/16/1963    Quit date: 04/15/1993    Years since quitting: 29.5   Smokeless tobacco: Never  Vaping Use   Vaping status: Never Used  Substance and Sexual Activity   Alcohol use: No   Drug use: No   Sexual activity: Not Currently  Other Topics Concern   Not on file  Social History Narrative   Patient lives alone in [place].   Patient has one daughter and 7 grandchildren.    Patient typically rides the bus for transportation, however her grandchildren will help her get around as needed.   Patient enjoys watching westerns on TV, reading the paper and reading books.    Patient does not have an active work out plan, however she stays "on the move all the time."    Income is from [employer] Disability    Social Determinants of Health   Financial Resource Strain: Low  Risk  (11/10/2022)   Overall Financial Resource Strain (CARDIA)    Difficulty of Paying Living Expenses: Not hard at all  Food Insecurity: No Food Insecurity (11/10/2022)   Hunger Vital Sign    Worried About Running Out of Food in the Last Year: Never true    Ran Out of Food in the Last Year: Never true  Transportation Needs: Unmet Transportation Needs (11/10/2022)   PRAPARE - Administrator, Civil Service (Medical): Yes    Lack of Transportation (Non-Medical): No  Physical Activity: Insufficiently Active (11/10/2022)   Exercise Vital Sign    Days of Exercise per Week: 1 day    Minutes of Exercise per Session: 20 min  Stress: No Stress Concern Present (11/10/2022)   [employer] of Occupational Health - Occupational Stress Questionnaire    Feeling of Stress :  Only a little  Social Connections: Moderately Isolated (11/10/2022)   Social Connection and Isolation Panel [NHANES]    Frequency of Communication with Friends and Family: More than three times a week    Frequency of Social Gatherings with Friends and Family: More than three times a week    Attends Religious Services: 1 to 4 times per year    Active Member of [employer] or Organizations: No    Attends Engineer, structural: Never    Marital Status: Divorced    Tobacco Counseling Counseling given: Not Answered   Clinical Intake:  Pre-visit preparation completed: Yes  Pain : No/denies pain     Diabetes: Yes CBG done?: No Did pt. bring in CBG monitor from home?: No  How often do you need to have someone help you when you read instructions, pamphlets, or other written materials from your doctor or pharmacy?: 1 - Never  Interpreter Needed?: No  Information entered by :: Remi Haggard LPN   Activities of Daily Living    11/10/2022    1:02 PM 12/02/2021   10:28 AM  In your present state of health, do you have any difficulty performing the following activities:  Hearing? 0 0  Vision? 0 1  Comment   wears prescription glasses  Difficulty concentrating or making decisions? 0 1  Walking or climbing stairs? 0 0  Dressing or bathing? 0 0  Doing errands, shopping? 0 0  Preparing Food and eating ? N N  Using the Toilet? N N  In the past six months, have you accidently leaked urine? N N  Do you have problems with loss of bowel control? N N  Managing your Medications? N N  Managing your Finances? N N  Housekeeping or managing your Housekeeping? N N    Patient Care Team: [name], DO as PCP - General (Family Medicine) Loreli Slot, MD (Cardiothoracic Surgery) Mia Creek, MD (Urgent Care) Spero Curb Judye Bos, PA-C as Physician Assistant (Dermatology) Linna Darner, RD as Dietitian (Family Medicine) Iva Boop, MD as Consulting Physician (Gastroenterology) Annamarie Major, CRNP as Nurse Practitioner (Nurse Practitioner) Martina Sinner, MD as Consulting Physician (Pulmonary Disease)  Indicate any recent Medical Services you may have received from other than [facility] providers in the past year (date may be approximate).     Assessment:   This is a routine wellness examination for Dilynn.  Hearing/Vision screen Hearing Screening - Comments:: No trouble hearing Vision Screening - Comments:: Up to date [facility]   Goals Addressed             This Visit's Progress    Patient Stated       Continue current lifestyle       Depression Screen    11/10/2022    1:07 PM 09/07/2022    2:57 PM 08/30/2022   10:51 AM 07/01/2022    3:59 PM 05/28/2022    1:33 PM 12/02/2021   10:06 AM 04/15/2021   10:54 AM  PHQ 2/9 Scores  PHQ - 2 Score 2 4 0 0 5 0 2  PHQ- 9 Score 2 8 4 4 9 9 9     Fall Risk    11/10/2022    1:01 PM 09/28/2022    1:26 PM 09/13/2022   10:38 AM 09/07/2022    2:56 PM 07/01/2022    3:59 PM  Fall Risk   Falls in the past year? 0 0 0 0 0  Number falls in past yr: 0 0 0  0 0  Injury with Fall? 0 0 0 0 0  Follow up Falls evaluation completed;Education  provided;Falls prevention discussed        MEDICARE RISK AT HOME: Medicare Risk at Home Any stairs in or around the home?: Yes If so, are there any without handrails?: No Home free of loose throw rugs in walkways, pet beds, electrical cords, etc?: Yes Adequate lighting in your home to reduce risk of falls?: Yes Life alert?: No Use of a cane, walker or w/c?: No Grab bars in the bathroom?: Yes Shower chair or bench in shower?: No Elevated toilet seat or a handicapped toilet?: No  TIMED UP AND GO:  Was the test performed?  No    Cognitive Function:      04/09/2019   10:26 AM  Montreal Cognitive Assessment   Visuospatial/ Executive (0/5) 1  Naming (0/3) 3  Attention: Read list of digits (0/2) 2  Attention: Read list of letters (0/1) 1  Attention: Serial 7 subtraction starting at 100 (0/3) 0  Language: Repeat phrase (0/2) 0  Language : Fluency (0/1) 0  Abstraction (0/2) 0  Delayed Recall (0/5) 0  Orientation (0/6) 6  Total 13  Adjusted Score (based on education) 14      11/10/2022    1:04 PM 12/02/2021   10:33 AM 05/20/2020    9:09 AM  6CIT Screen  What Year? 4 points 0 points 0 points  What month? 0 points 0 points 0 points  What time? 0 points 0 points 0 points  Count back from 20 0 points 0 points 4 points  Months in reverse 2 points 4 points 2 points  Repeat phrase 10 points 0 points 0 points  Total Score 16 points 4 points 6 points    Immunizations Immunization History  Administered Date(s) Administered   Fluad Quad(high Dose 65+) 12/24/2019   Hepb-cpg 08/03/2021, 09/04/2021   Influenza Split 02/18/2012   Influenza,inj,Quad PF,6+ Mos 12/27/2012, 12/12/2013, 12/09/2014, 12/01/2015, 11/16/2016, 11/10/2017, 10/25/2018, 11/10/2020   Influenza-Unspecified 11/10/2021   Moderna Sars-Covid-2 Vaccination 03/31/2019, 04/28/2019, 01/15/2020   Pfizer Covid-19 Vaccine Bivalent Booster 66yrs & up 01/21/2021   Pneumococcal Conjugate-13 01/02/2014   Pneumococcal  Polysaccharide-23 07/15/2016   Tdap 03/24/2017   Unspecified SARS-COV-2 Vaccination 11/10/2021   Zoster Recombinant(Shingrix) 06/13/2017, 08/11/2017    TDAP status: Up to date  Flu Vaccine status: Due, Education has been provided regarding the importance of this vaccine. Advised may receive this vaccine at local pharmacy or Health Dept. Aware to provide a copy of the vaccination record if obtained from local pharmacy or Health Dept. Verbalized acceptance and understanding.  Pneumococcal vaccine status: Up to date  Covid-19 vaccine status: Information provided on how to obtain vaccines.   Qualifies for Shingles Vaccine? No   Zostavax completed Yes   Shingrix Completed?: Yes  Screening Tests Health Maintenance  Topic Date Due   FOOT EXAM  01/05/2020   COVID-19 Vaccine (6 - 2023-24 season) 10/17/2022   INFLUENZA VACCINE  05/16/2023 (Originally 09/16/2022)   OPHTHALMOLOGY EXAM  02/26/2023   HEMOGLOBIN A1C  03/02/2023   Diabetic kidney evaluation - Urine ACR  05/28/2023   Diabetic kidney evaluation - eGFR measurement  09/28/2023   Medicare Annual Wellness (AWV)  11/10/2023   DTaP/Tdap/Td (2 - Td or Tdap) 03/25/2027   Pneumonia Vaccine 64+ Years old  Completed   DEXA SCAN  Completed   Zoster Vaccines- Shingrix  Completed   HPV VACCINES  Aged Out    Health Maintenance  Health Maintenance  Due  Topic Date Due   FOOT EXAM  01/05/2020   COVID-19 Vaccine (6 - 2023-24 season) 10/17/2022    Colorectal cancer screening: No longer required.   Mammogram status: No longer required due to age.  Bone Density status: Completed 2016. Results reflect: Bone density results: NORMAL. Repeat every 0 years.  Lung Cancer Screening: (Low Dose CT Chest recommended if Age 30-80 years, 20 pack-year currently smoking OR have quit w/in 15years.) does not qualify.   Lung Cancer Screening Referral:   Additional Screening:  Hepatitis C Screening: does not qualify; Completed never done  Vision  Screening: Recommended annual ophthalmology exams for early detection of glaucoma and other disorders of the eye. Is the patient up to date with their annual eye exam?  Yes  Who is the provider or what is the name of the office in which the patient attends annual eye exams? [facility] If pt is not established with a provider, would they like to be referred to a provider to establish care? No .   Dental Screening: Recommended annual dental exams for proper oral hygiene  Nutrition Risk Assessment:  Has the patient had any N/V/D within the last 2 months?  No  Does the patient have any non-healing wounds?  No  Has the patient had any unintentional weight loss or weight gain?  No   Diabetes:  Is the patient diabetic?  Yes  If diabetic, was a CBG obtained today?  No  Did the patient bring in their glucometer from home?  No  How often do you monitor your CBG's? Does not check.   Financial Strains and Diabetes Management:  Are you having any financial strains with the device, your supplies or your medication? No .  Does the patient want to be seen by Chronic Care Management for management of their diabetes?  No  Would the patient like to be referred to a Nutritionist or for Diabetic Management?  No   Diabetic Exams:  Diabetic Eye Exam: Completed Pt has been advised about the importance in completing this exam.   Diabetic Foot Exam: . Pt has been advised about the importance in completing this exam.    Community Resource Referral / Chronic Care Management: CRR required this visit?  No   CCM required this visit?  No     Plan:     I have personally reviewed and noted the following in the patient's chart:   Medical and social history Use of alcohol, tobacco or illicit drugs  Current medications and supplements including opioid prescriptions. Patient is not currently taking opioid prescriptions. Functional ability and status Nutritional status Physical activity Advanced  directives List of other physicians Hospitalizations, surgeries, and ER visits in previous 12 months Vitals Screenings to include cognitive, depression, and falls Referrals and appointments  In addition, I have reviewed and discussed with patient certain preventive protocols, quality metrics, and best practice recommendations. A written personalized care plan for preventive services as well as general preventive health recommendations were provided to patient.     Remi Haggard, LPN   10/14/5619   After Visit Summary: (Declined) Due to this being a telephonic visit, with patients personalized plan was offered to patient but patient Declined AVS at this time   Nurse Notes:

## 2022-11-10 NOTE — Patient Instructions (Signed)
[name] , Thank you for taking time to come for your Medicare Wellness Visit. I appreciate your ongoing commitment to your health goals. Please review the following plan we discussed and let me know if I can assist you in the future.   Screening recommendations/referrals: Colonoscopy: no longer required Mammogram: no longer required  Bone Density: normal bone density  Recommended yearly ophthalmology/optometry visit for glaucoma screening and checkup Recommended yearly dental visit for hygiene and checkup  Vaccinations: Influenza vaccine: Education provided Pneumococcal vaccine: up to date Tdap vaccine: up to date Shingles vaccine: up to date    Advanced directives: Education provided     Preventive Care 65 Years and Older, Female Preventive care refers to lifestyle choices and visits with your health care provider that can promote health and wellness. What does preventive care include? A yearly physical exam. This is also called an annual well check. Dental exams once or twice a year. Routine eye exams. Ask your health care provider how often you should have your eyes checked. Personal lifestyle choices, including: Daily care of your teeth and gums. Regular physical activity. Eating a healthy diet. Avoiding tobacco and drug use. Limiting alcohol use. Practicing safe sex. Taking low-dose aspirin every day. Taking vitamin and mineral supplements as recommended by your health care provider. What happens during an annual well check? The services and screenings done by your health care provider during your annual well check will depend on your age, overall health, lifestyle risk factors, and family history of disease. Counseling  Your health care provider may ask you questions about your: Alcohol use. Tobacco use. Drug use. Emotional well-being. Home and relationship well-being. Sexual activity. Eating habits. History of falls. Memory and ability to understand  (cognition). Work and work Astronomer. Reproductive health. Screening  You may have the following tests or measurements: Height, weight, and BMI. Blood pressure. Lipid and cholesterol levels. These may be checked every 5 years, or more frequently if you are over 43 years old. Skin check. Lung cancer screening. You may have this screening every year starting at age 74 if you have a 30-pack-year history of smoking and currently smoke or have quit within the past 15 years. Fecal occult blood test (FOBT) of the stool. You may have this test every year starting at age 36. Flexible sigmoidoscopy or colonoscopy. You may have a sigmoidoscopy every 5 years or a colonoscopy every 10 years starting at age 101. Hepatitis C blood test. Hepatitis B blood test. Sexually transmitted disease (STD) testing. Diabetes screening. This is done by checking your blood sugar (glucose) after you have not eaten for a while (fasting). You may have this done every 1-3 years. Bone density scan. This is done to screen for osteoporosis. You may have this done starting at age 15. Mammogram. This may be done every 1-2 years. Talk to your health care provider about how often you should have regular mammograms. Talk with your health care provider about your test results, treatment options, and if necessary, the need for more tests. Vaccines  Your health care provider may recommend certain vaccines, such as: Influenza vaccine. This is recommended every year. Tetanus, diphtheria, and acellular pertussis (Tdap, Td) vaccine. You may need a Td booster every 10 years. Zoster vaccine. You may need this after age 4. Pneumococcal 13-valent conjugate (PCV13) vaccine. One dose is recommended after age 47. Pneumococcal polysaccharide (PPSV23) vaccine. One dose is recommended after age 24. Talk to your health care provider about which screenings and vaccines you need  and how often you need them. This information is not intended to  replace advice given to you by your health care provider. Make sure you discuss any questions you have with your health care provider. Document Released: 02/28/2015 Document Revised: 10/22/2015 Document Reviewed: 12/03/2014 Elsevier Interactive Patient Education  2017 [employer].  Fall Prevention in the Home Falls can cause injuries. They can happen to people of all ages. There are many things you can do to make your home safe and to help prevent falls. What can I do on the outside of my home? Regularly fix the edges of walkways and driveways and fix any cracks. Remove anything that might make you trip as you walk through a door, such as a raised step or threshold. Trim any bushes or trees on the path to your home. Use bright outdoor lighting. Clear any walking paths of anything that might make someone trip, such as rocks or tools. Regularly check to see if handrails are loose or broken. Make sure that both sides of any steps have handrails. Any raised decks and porches should have guardrails on the edges. Have any leaves, snow, or ice cleared regularly. Use sand or salt on walking paths during winter. Clean up any spills in your garage right away. This includes oil or grease spills. What can I do in the bathroom? Use night lights. Install grab bars by the toilet and in the tub and shower. Do not use towel bars as grab bars. Use non-skid mats or decals in the tub or shower. If you need to sit down in the shower, use a plastic, non-slip stool. Keep the floor dry. Clean up any water that spills on the floor as soon as it happens. Remove soap buildup in the tub or shower regularly. Attach bath mats securely with double-sided non-slip rug tape. Do not have throw rugs and other things on the floor that can make you trip. What can I do in the bedroom? Use night lights. Make sure that you have a light by your bed that is easy to reach. Do not use any sheets or blankets that are too big for  your bed. They should not hang down onto the floor. Have a firm chair that has side arms. You can use this for support while you get dressed. Do not have throw rugs and other things on the floor that can make you trip. What can I do in the kitchen? Clean up any spills right away. Avoid walking on wet floors. Keep items that you use a lot in easy-to-reach places. If you need to reach something above you, use a strong step stool that has a grab bar. Keep electrical cords out of the way. Do not use floor polish or wax that makes floors slippery. If you must use wax, use non-skid floor wax. Do not have throw rugs and other things on the floor that can make you trip. What can I do with my stairs? Do not leave any items on the stairs. Make sure that there are handrails on both sides of the stairs and use them. Fix handrails that are broken or loose. Make sure that handrails are as long as the stairways. Check any carpeting to make sure that it is firmly attached to the stairs. Fix any carpet that is loose or worn. Avoid having throw rugs at the top or bottom of the stairs. If you do have throw rugs, attach them to the floor with carpet tape. Make sure that you  have a light switch at the top of the stairs and the bottom of the stairs. If you do not have them, ask someone to add them for you. What else can I do to help prevent falls? Wear shoes that: Do not have high heels. Have rubber bottoms. Are comfortable and fit you well. Are closed at the toe. Do not wear sandals. If you use a stepladder: Make sure that it is fully opened. Do not climb a closed stepladder. Make sure that both sides of the stepladder are locked into place. Ask someone to hold it for you, if possible. Clearly mark and make sure that you can see: Any grab bars or handrails. First and last steps. Where the edge of each step is. Use tools that help you move around (mobility aids) if they are needed. These  include: Canes. Walkers. Scooters. Crutches. Turn on the lights when you go into a dark area. Replace any light bulbs as soon as they burn out. Set up your furniture so you have a clear path. Avoid moving your furniture around. If any of your floors are uneven, fix them. If there are any pets around you, be aware of where they are. Review your medicines with your doctor. Some medicines can make you feel dizzy. This can increase your chance of falling. Ask your doctor what other things that you can do to help prevent falls. This information is not intended to replace advice given to you by your health care provider. Make sure you discuss any questions you have with your health care provider. Document Released: 11/28/2008 Document Revised: 07/10/2015 Document Reviewed: 03/08/2014 Elsevier Interactive Patient Education  2017 [employer].

## 2022-11-11 ENCOUNTER — Other Ambulatory Visit: Payer: Self-pay | Admitting: Nurse Practitioner

## 2022-11-11 DIAGNOSIS — K754 Autoimmune hepatitis: Secondary | ICD-10-CM

## 2022-11-11 DIAGNOSIS — K7400 Hepatic fibrosis, unspecified: Secondary | ICD-10-CM | POA: Diagnosis not present

## 2022-11-18 ENCOUNTER — Other Ambulatory Visit: Payer: Self-pay

## 2022-11-18 DIAGNOSIS — E118 Type 2 diabetes mellitus with unspecified complications: Secondary | ICD-10-CM

## 2022-11-18 MED ORDER — ATORVASTATIN CALCIUM 40 MG PO TABS: 40.0000 mg | ORAL_TABLET | Freq: Every day | ORAL | 3 refills | Status: DC

## 2022-11-19 ENCOUNTER — Other Ambulatory Visit: Payer: 59

## 2022-11-30 ENCOUNTER — Other Ambulatory Visit: Payer: 59

## 2022-12-08 ENCOUNTER — Other Ambulatory Visit: Payer: 59

## 2022-12-09 ENCOUNTER — Ambulatory Visit: Payer: 59 | Admitting: Family Medicine

## 2022-12-09 ENCOUNTER — Encounter: Payer: Self-pay | Admitting: Family Medicine

## 2022-12-09 VITALS — BP 143/66 | HR 70 | Ht 66.0 in | Wt 167.6 lb

## 2022-12-09 DIAGNOSIS — M25561 Pain in right knee: Secondary | ICD-10-CM | POA: Diagnosis not present

## 2022-12-09 DIAGNOSIS — F03A18 Unspecified dementia, mild, with other behavioral disturbance: Secondary | ICD-10-CM | POA: Diagnosis not present

## 2022-12-09 DIAGNOSIS — M25562 Pain in left knee: Secondary | ICD-10-CM

## 2022-12-09 DIAGNOSIS — Z23 Encounter for immunization: Secondary | ICD-10-CM

## 2022-12-09 DIAGNOSIS — I10 Essential (primary) hypertension: Secondary | ICD-10-CM

## 2022-12-09 DIAGNOSIS — G8929 Other chronic pain: Secondary | ICD-10-CM | POA: Diagnosis not present

## 2022-12-09 MED ORDER — CHLORTHALIDONE 25 MG PO TABS: 25.0000 mg | ORAL_TABLET | Freq: Every day | ORAL | 3 refills | Status: DC

## 2022-12-09 MED ORDER — DICLOFENAC SODIUM 1 % EX GEL: 2.0000 g | Freq: Four times a day (QID) | CUTANEOUS | 3 refills | Status: AC

## 2022-12-09 NOTE — Progress Notes (Signed)
    SUBJECTIVE:   CHIEF COMPLAINT / HPI:   Knee pain Bilateral knee pain for "a long time ", however has worsened over the last 2 months.  Left knee is worse than right.  She is able to walk dependently without difficulty.  States that the pain comes and goes, will some times occur at rest.  Reports family history of arthritis and states that she thinks it is arthritis.  Not tried anything for the pain.  Memory loss Daughter reports patient's memory is worsening.  Patient has been seen multiple times for this  PERTINENT  PMH / PSH: Hypertension, GERD, mild dementia  OBJECTIVE:   BP (!) 143/66   Pulse 70   Ht 5\' 6"  (1.676 m)   Wt 167 lb 9.6 oz (76 kg)   SpO2 100%   BMI 27.05 kg/m   MSK: No tenderness to palpation throughout bilateral knees, no patellar tenderness, no quadriceps tenderness, no joint line tenderness.  Able to ambulate independently into the room without any assistance.  No swelling appreciated on my exam to bilateral lower extremities.  No overlying skin changes.  No bruising.  No calf tenderness to palpation, warmth, or swelling to bilateral lower extremities.  ASSESSMENT/PLAN:   Knee pain, bilateral Chronic, worse over the last 2 months.  Knee x-rays 10 years ago showed osteoarthritis.  No trauma to the knees.  I do not suspect acute fracture or ligament tear.  No warmth or erythema over the joints. -Will try Voltaren gel -Advised to ice the knee if she feels like there is swelling, and elevate -Follow-up with PCP if the pain worsens or begins to affect her quality of life  Dementia, mild Patient's daughter asked if there are any medications to try for memory.  Advised to follow-up with PCP who knows her a bit better to discuss memory loss and medications such as Aricept  Hypertension Blood pressure elevated today, 166/68 initially, recheck 143/66.  Patient has not taken her blood pressure medication in 2 days because she ran out.   -Refilled chlorthalidone 25  mg     Para March, DO [facility]

## 2022-12-09 NOTE — Assessment & Plan Note (Signed)
Patient's daughter asked if there are any medications to try for memory.  Advised to follow-up with PCP who knows her a bit better to discuss memory loss and medications such as Aricept

## 2022-12-09 NOTE — Assessment & Plan Note (Signed)
Blood pressure elevated today, 166/68 initially, recheck 143/66.  Patient has not taken her blood pressure medication in 2 days because she ran out.   -Refilled chlorthalidone 25 mg

## 2022-12-09 NOTE — Patient Instructions (Addendum)
It was great to see you today! Thank you for choosing [facility] for your primary care. Susan Ingram was seen for knee pain.   Today we addressed: Knee pain - I have prescribed voltaren gel to try on your knees. I also recommend taking Tylenol 500mg  every 6-8 hours for your knees. If they do not improve, please come back to see your PCP. Memory - please schedule an appointment with your PCP to discuss the pros and cons of memory medications.  Start taking your Blood Pressure medicine again.  If you haven't already, sign up for My Chart to have easy access to your labs results, and communication with your primary care physician.  Call the clinic at [phone] if your symptoms worsen or you have any concerns.  Please arrive 15 minutes before your appointment to ensure smooth check in process.  We appreciate your efforts in making this happen.  Thank you for allowing me to participate in your care, Para March, DO 12/09/2022, 4:08 PM PGY-1, [facility]

## 2022-12-09 NOTE — Assessment & Plan Note (Signed)
Chronic, worse over the last 2 months.  Knee x-rays 10 years ago showed osteoarthritis.  No trauma to the knees.  I do not suspect acute fracture or ligament tear.  No warmth or erythema over the joints. -Will try Voltaren gel -Advised to ice the knee if she feels like there is swelling, and elevate -Follow-up with PCP if the pain worsens or begins to affect her quality of life

## 2022-12-15 ENCOUNTER — Ambulatory Visit
Admission: RE | Admit: 2022-12-15 | Discharge: 2022-12-15 | Disposition: A | Discharge status: Home / Self Care | Payer: 59 | Source: Ambulatory Visit | Attending: Nurse Practitioner

## 2022-12-15 DIAGNOSIS — K8020 Calculus of gallbladder without cholecystitis without obstruction: Secondary | ICD-10-CM | POA: Diagnosis not present

## 2022-12-15 DIAGNOSIS — K754 Autoimmune hepatitis: Secondary | ICD-10-CM | POA: Diagnosis not present

## 2022-12-15 DIAGNOSIS — K7400 Hepatic fibrosis, unspecified: Secondary | ICD-10-CM

## 2022-12-16 ENCOUNTER — Ambulatory Visit: Payer: 59 | Admitting: Student

## 2022-12-22 ENCOUNTER — Other Ambulatory Visit: Payer: Self-pay | Admitting: Student

## 2022-12-22 DIAGNOSIS — K219 Gastro-esophageal reflux disease without esophagitis: Secondary | ICD-10-CM

## 2022-12-22 MED ORDER — CETIRIZINE HCL 10 MG PO TABS: 5.0000 mg | ORAL_TABLET | Freq: Every day | ORAL | 0 refills | Status: AC

## 2023-01-04 ENCOUNTER — Encounter: Payer: Self-pay | Admitting: Family Medicine

## 2023-01-04 ENCOUNTER — Ambulatory Visit: Payer: 59 | Admitting: Family Medicine

## 2023-01-04 VITALS — BP 134/64 | HR 70 | Wt 167.0 lb

## 2023-01-04 DIAGNOSIS — K760 Fatty (change of) liver, not elsewhere classified: Secondary | ICD-10-CM

## 2023-01-04 DIAGNOSIS — E876 Hypokalemia: Secondary | ICD-10-CM | POA: Diagnosis not present

## 2023-01-04 DIAGNOSIS — E119 Type 2 diabetes mellitus without complications: Secondary | ICD-10-CM

## 2023-01-04 DIAGNOSIS — M170 Bilateral primary osteoarthritis of knee: Secondary | ICD-10-CM | POA: Diagnosis not present

## 2023-01-04 DIAGNOSIS — I10 Essential (primary) hypertension: Secondary | ICD-10-CM

## 2023-01-04 LAB — POCT GLYCOSYLATED HEMOGLOBIN (HGB A1C): HbA1c, POC (controlled diabetic range): 7 % (ref 0.0–7.0)

## 2023-01-04 MED ORDER — MELOXICAM 15 MG PO TABS
15.0000 mg | ORAL_TABLET | Freq: Every day | ORAL | 0 refills | Status: AC | PRN
Start: 2023-01-04 — End: 2023-01-11

## 2023-01-04 MED ORDER — METHYLPREDNISOLONE ACETATE 40 MG/ML IJ SUSP
40.0000 mg | Freq: Once | INTRAMUSCULAR | Status: AC
Start: 2023-01-04 — End: 2023-01-04
  Administered 2023-01-04: 40 mg via INTRAMUSCULAR

## 2023-01-04 NOTE — Assessment & Plan Note (Signed)
Previously potassium 2.9.  Repeated CMP today.

## 2023-01-04 NOTE — Assessment & Plan Note (Signed)
Better controlled today.  Continue management.

## 2023-01-04 NOTE — Patient Instructions (Addendum)
Today you received an injection with corticosteroid. This injection is usually done in response to pain and inflammation. In your case, this is to help symptoms of arthritis. There is some "numbing" medicine also in the shot so the injected area may be numb and feel really good for the next couple of hours. The numbing medicine usually wears off in 2-3 hours though, and then your pain level will be right back where it was before the injection.   The actually benefit from the steroid injection is usually noticed in 2-7 days. You may actually experience a small (as in 10%) INCREASE in pain in the first 24 hours---that is common.   Things to watch out for that you should contact us or a health care provider urgently would include:  1. Unusual (as in more than 10%) increase in pain  2. New fever > 101.5  3. New swelling or redness of the injected area.  4. Streaking of red lines around the area injected.    Also, I have referred you to physical therapy. I have also sent in meloxicam daily for pain as needed. We updated labs today, as well.

## 2023-01-04 NOTE — Progress Notes (Signed)
    SUBJECTIVE:   CHIEF COMPLAINT / HPI:   Bilateral knee pain Has been occurring for multiple years.  X-rays 10 years ago demonstrated OA.  Pain is worse whenever she walks a lot.  Also painful on the front and the top of the knee.  Has tried aspirin as well as Voltaren gel without much relief.  Has never had an injection.  Has never went to physical therapy. Has never tried meloxicam, but she denies renal dysfunction or history of gastric ulcers.  Type 2 diabetes Would like to have her A1c rechecked today.  Hypokalemia, elevated alkaline phosphatase As evidenced on labs back in August 2024.  She had been told that these were abnormal.  She has not had these rechecked.  PERTINENT  PMH / PSH: Hypertension, asthma, hepatic steatosis, GERD, dementia mild, HLD, anxiety, MDD  OBJECTIVE:   BP 134/64   Pulse 70   Wt 167 lb (75.8 kg)   SpO2 99%   BMI 26.95 kg/m   General: Alert and oriented, in NAD Skin: Warm, dry, and intact without lesions HEENT: NCAT, EOM grossly normal, midline nasal septum Cardiac: Regular rate Respiratory: Breathing and speaking comfortably on RA Musculoskeletal: Left knee with noticeable mild-moderate effusion but without erythema/warmth, no pain to palpation, strength and range of motion grossly intact, negative anterior posterior drawers, negative lachman, negative valgus/varus testing Neurological: No gross focal deficit Psychiatric: Appropriate mood and affect   ASSESSMENT/PLAN:   Hypertension Better controlled today.  Continue management.  Hepatic steatosis Repeated CMP today.  Hypokalemia Previously potassium 2.9.  Repeated CMP today.  Bilateral primary osteoarthritis of knee Not improving.  Radiographic evidence in 2014 of right OA.  Likely also in left knee.  Failed conservative management.  After discussion of corticosteroid injection for the left knee which bothered her most, patient elected to proceed.  Physical therapy referral placed.  1  week course of meloxicam prescribed to be used as needed. Can discuss injection in right knee as needed if her pain in the left knee improves.  Type 2 diabetes mellitus without complications (HCC) Diet controlled with A1c stable at 7.  Continue lifestyle management.  Discussed transient increase in sugars after injection.   Health maintenance Consider completion of diabetic foot exam with PCP.  RIGHT KNEE INJECTION PROCEDURE:  Risks & benefits of left knee cortisone injection reviewed. Consent obtained. Patient prepped and draped in the normal fashion. Area cleansed with alcohol. Ethyl chloride spray used to anesthetize the skin. Solution of 3 mL 1% lidocaine with 1 mL methylprednisolone (Depo-medrol) 40mg /mL injected into the left knee via the anterior medial approach. Patient tolerated procedure well without any complications. Area covered with adhesive bandage.  Post-procedure care reviewed, all questions answered.   Janeal Holmes, MD [facility]

## 2023-01-04 NOTE — Assessment & Plan Note (Signed)
Diet controlled with A1c stable at 7.  Continue lifestyle management.  Discussed transient increase in sugars after injection.

## 2023-01-04 NOTE — Assessment & Plan Note (Signed)
Repeated CMP today.

## 2023-01-04 NOTE — Assessment & Plan Note (Addendum)
Not improving.  Radiographic evidence in 2014 of right OA.  Likely also in left knee.  Failed conservative management.  After discussion of corticosteroid injection for the left knee which bothered her most, patient elected to proceed.  Physical therapy referral placed.  1 week course of meloxicam prescribed to be used as needed. Can discuss injection in right knee as needed if her pain in the left knee improves.

## 2023-01-05 ENCOUNTER — Telehealth: Payer: Self-pay | Admitting: Family Medicine

## 2023-01-05 LAB — COMPREHENSIVE METABOLIC PANEL
ALT: 16 [IU]/L (ref 0–32)
AST: 21 [IU]/L (ref 0–40)
Albumin: 4.1 g/dL (ref 3.7–4.7)
Alkaline Phosphatase: 142 [IU]/L — ABNORMAL HIGH (ref 44–121)
BUN/Creatinine Ratio: 22 (ref 12–28)
BUN: 17 mg/dL (ref 8–27)
Bilirubin Total: 0.7 mg/dL (ref 0.0–1.2)
CO2: 24 mmol/L (ref 20–29)
Calcium: 10.1 mg/dL (ref 8.7–10.3)
Chloride: 102 mmol/L (ref 96–106)
Creatinine, Ser: 0.77 mg/dL (ref 0.57–1.00)
Globulin, Total: 3 g/dL (ref 1.5–4.5)
Glucose: 104 mg/dL — ABNORMAL HIGH (ref 70–99)
Potassium: 4 mmol/L (ref 3.5–5.2)
Sodium: 140 mmol/L (ref 134–144)
Total Protein: 7.1 g/dL (ref 6.0–8.5)
eGFR: 76 mL/min/{1.73_m2} (ref >59)

## 2023-01-05 NOTE — Telephone Encounter (Signed)
Sent results letter to patient detailing normal K and Cr. Alk phos remains slightly elevated. This level of elevation unlikely malignancy. RUQ [place] earlier this year and in 2023 with likely hepatic steatosis. She also has a history of vitamin D deficiency. Does not appear she is on a supplement. Could consider rechecking vitamin D at next visit, repleting as needed, and assessing for improvement in alk phos.

## 2023-03-17 NOTE — Progress Notes (Cosign Needed)
  SUBJECTIVE:   CHIEF COMPLAINT / HPI:   - Cold Symptoms Coughing a lot, chest hurts from coughing. Also has runny nose, also having bad headaches. Everything started about 2 weeks ago. No fevers, nausea, vomiting or diarrhea. Also appreciates some foot swelling. Having some SOB with coughing and sore throat. Coughing is worse at night.   HTN Not taking any meds for this, eat's a lot of salty food. No CP or SOB at baseline, or currently today.   PERTINENT  PMH / PSH:    OBJECTIVE:  BP (!) 169/83   Pulse 89   Ht 5\' 6"  (1.676 m)   Wt 174 lb 12.8 oz (79.3 kg)   SpO2 100%   BMI 28.21 kg/m  Physical Exam Constitutional:      Appearance: Normal appearance.  Cardiovascular:     Rate and Rhythm: Normal rate and regular rhythm.     Pulses: Normal pulses.     Heart sounds: Normal heart sounds. No murmur heard.    No friction rub. No gallop.  Pulmonary:     Effort: Pulmonary effort is normal. No respiratory distress.     Breath sounds: Normal breath sounds. No stridor. No wheezing, rhonchi or rales.  Skin:    Capillary Refill: Capillary refill takes less than 2 seconds.  Neurological:     Mental Status: She is alert.  Psychiatric:        Mood and Affect: Mood normal.        Behavior: Behavior normal.      ASSESSMENT/PLAN:   Assessment & Plan Subacute cough Patient comes in with concern of cough, and has been an issue for roughly 2 weeks.  Cough is nonproductive, appreciates no shortness of breath or dyspnea.  Also appreciates runny nose, but denies any fevers, nausea, vomiting, diarrhea.  Patient also appreciates that she has been having headaches, since symptoms started.  Symptoms concerning for viral infection, versus allergies.  Will recommend cough medicine, and obtain chest imaging to rule out atypical pneumonia.  Suspect this is URI with virus, will recommend symptom management.  Also considered allergies, if symptoms not improving in next 2 weeks, will recommend trial of  allergy medicines for possible postnasal drip. - Chest x-ray - Tessalon Perles - Follow-up 2 weeks if not improved - Consider allergy medicine at follow-up Primary hypertension Patient comes in with elevated blood pressure, but no symptoms of chest pain or shortness of breath.  Patient reports that she has not taken her blood pressure medicine.  Has been off of it for a while.  Will refill blood pressure medicine and recommend patient follow-up in 2 weeks for blood pressure check. - Chlorthalidone 25 mg daily - Follow-up 2 weeks No follow-ups on file. Bess Kinds, MD 03/18/2023, 10:03 AM PGY-3, [facility]

## 2023-03-17 NOTE — Patient Instructions (Addendum)
It was great to see you! Thank you for allowing me to participate in your care!  I recommend that you always bring your medications to each appointment as this makes it easy to ensure we are on the correct medications and helps [place] not miss when refills are needed.  Our plans for today:  - Cold Symptoms Your symptoms sound like a cold or virus. This usually get's better on it's own, and can be managed with over the counter medicine. I'd recommend you try using teaspoons of honey, or honey in tea.   I'm also sending you some cough medicine to help. You can take one of these pills 3 times a day.   *Keep away from children, they are toxic and can be life threatening!   We also are going to get a chest X-ray, to be sure you don't have a "walking pneumonia"    Get your Chest X-ray at    [facility] Address: [address] [place], [place], [place] [address] Phone: [phone]  - High Blood Pressure We are restarting your blood pressure medicine. You will need to follow up in 2 weeks for a blood pressure recheck.   Take care and seek immediate care sooner if you develop any concerns.   Dr. Bess Kinds, MD [facility]

## 2023-03-18 ENCOUNTER — Other Ambulatory Visit: Payer: Self-pay | Admitting: Student

## 2023-03-18 ENCOUNTER — Ambulatory Visit
Admission: RE | Admit: 2023-03-18 | Discharge: 2023-03-18 | Disposition: A | Discharge status: Home / Self Care | Payer: 59 | Source: Ambulatory Visit | Attending: Family Medicine | Admitting: Family Medicine

## 2023-03-18 ENCOUNTER — Ambulatory Visit (INDEPENDENT_AMBULATORY_CARE_PROVIDER_SITE_OTHER): Payer: 59 | Admitting: Student

## 2023-03-18 VITALS — BP 169/83 | HR 89 | Ht 66.0 in | Wt 174.8 lb

## 2023-03-18 DIAGNOSIS — I10 Essential (primary) hypertension: Secondary | ICD-10-CM

## 2023-03-18 DIAGNOSIS — R059 Cough, unspecified: Secondary | ICD-10-CM | POA: Diagnosis not present

## 2023-03-18 DIAGNOSIS — Z1231 Encounter for screening mammogram for malignant neoplasm of breast: Secondary | ICD-10-CM

## 2023-03-18 DIAGNOSIS — R052 Subacute cough: Secondary | ICD-10-CM | POA: Diagnosis not present

## 2023-03-18 MED ORDER — BENZONATATE 100 MG PO CAPS: 100.0000 mg | ORAL_CAPSULE | Freq: Three times a day (TID) | ORAL | 0 refills | Status: DC | PRN

## 2023-03-18 MED ORDER — CHLORTHALIDONE 25 MG PO TABS: 25.0000 mg | ORAL_TABLET | Freq: Every day | ORAL | 3 refills | Status: DC

## 2023-03-18 NOTE — Assessment & Plan Note (Addendum)
Patient comes in with elevated blood pressure, but no symptoms of chest pain or shortness of breath.  Patient reports that she has not taken her blood pressure medicine.  Has been off of it for a while.  Will refill blood pressure medicine and recommend patient follow-up in 2 weeks for blood pressure check. - Chlorthalidone 25 mg daily - Follow-up 2 weeks

## 2023-03-18 NOTE — Assessment & Plan Note (Addendum)
Patient comes in with concern of cough, and has been an issue for roughly 2 weeks.  Cough is nonproductive, appreciates no shortness of breath or dyspnea.  Also appreciates runny nose, but denies any fevers, nausea, vomiting, diarrhea.  Patient also appreciates that she has been having headaches, since symptoms started.  Symptoms concerning for viral infection, versus allergies.  Will recommend cough medicine, and obtain chest imaging to rule out atypical pneumonia.  Suspect this is URI with virus, will recommend symptom management.  Also considered allergies, if symptoms not improving in next 2 weeks, will recommend trial of allergy medicines for possible postnasal drip. - Chest x-ray - Tessalon Perles - Follow-up 2 weeks if not improved - Consider allergy medicine at follow-up

## 2023-03-20 ENCOUNTER — Ambulatory Visit (HOSPITAL_COMMUNITY)
Admission: EM | Admit: 2023-03-20 | Discharge: 2023-03-20 | Disposition: A | Discharge status: Home / Self Care | Payer: 59

## 2023-03-20 ENCOUNTER — Encounter (HOSPITAL_COMMUNITY): Payer: Self-pay

## 2023-03-20 DIAGNOSIS — J40 Bronchitis, not specified as acute or chronic: Secondary | ICD-10-CM | POA: Diagnosis not present

## 2023-03-20 MED ORDER — PREDNISONE 20 MG PO TABS: 40.0000 mg | ORAL_TABLET | Freq: Every day | ORAL | 0 refills | Status: DC

## 2023-03-20 MED ORDER — ALBUTEROL SULFATE HFA 108 (90 BASE) MCG/ACT IN AERS
INHALATION_SPRAY | RESPIRATORY_TRACT | Status: AC
  Filled 2023-03-20: qty 6.7

## 2023-03-20 MED ORDER — ALBUTEROL SULFATE HFA 108 (90 BASE) MCG/ACT IN AERS
1.0000 | INHALATION_SPRAY | Freq: Once | RESPIRATORY_TRACT | Status: AC
  Administered 2023-03-20: 1 via RESPIRATORY_TRACT

## 2023-03-20 NOTE — ED Provider Notes (Signed)
[facility]    CSN: [id number] Arrival date & time: 03/20/23  1314      History   Chief Complaint Chief Complaint  Patient presents with   Cough   Shortness of Breath    HPI Susan Ingram is a 83 y.o. female.   83 year old female who presents to urgent care with complaints of cough and shortness of breath.  This has been going on for almost 2 weeks now. The symptoms have been mostly unchanged in the last week.  She is developing body aches from coughing so much.  She reports the cough is much worse at night.  She saw her primary care physician on Friday due to the symptoms and a chest x-ray was ordered.  She was told to use Tessalon pearls which she has been using but does not seem to be helping much.  She has not heard the results of the x-ray and due to her symptoms she presented today for further evaluation.   Cough Associated symptoms: shortness of breath   Associated symptoms: no chest pain, no chills, no ear pain, no fever, no rash and no sore throat   Shortness of Breath Associated symptoms: cough   Associated symptoms: no abdominal pain, no chest pain, no ear pain, no fever, no rash, no sore throat and no vomiting     Past Medical History:  Diagnosis Date   Acute pharyngitis 02/17/2022   Arthralgia of right temporomandibular joint 10/10/2012   Arthritis    RIGHT KNEE   Asthma    Autoimmune hepatitis (HCC) 07/28/2021   Cervical stenosis of spinal canal 09/03/2015   MRI 08/2015: Degenerative changes resulting in moderate canal stenosis C3-4, mild to moderate at C4-5 and C5-6.   Neural foraminal narrowing C3-4 thru C6-7, moderate at multiple levels.   Cholelithiasis without cholecystitis 10/04/2020   Diagnosed on RUQ [place] 10/02/20 as part of evaluation for elevated alk phos.      Diabetes (HCC) 02/28/2018   Dizzy spells 08/11/2013   Eczema    Elevated alkaline phosphatase level 04/10/2020   Eustachian tube dysfunction 07/23/2016   Heartburn 06/18/2014    Helicobacter pylori gastritis 10/08/2014   History of lung cancer 11/24/2010   S/P Bronchoscopy, left VATS, wedge resection,lingular segmentectomy, lymph node dissection 05/23/2008, [place]. Recently seen by [facility] on 10/02/13.  Negative CT scan. Patient now cured.    History of lung cancer 11/24/2010   S/P Bronchoscopy, left VATS, wedge resection,lingular segmentectomy, lymph node dissection 05/23/2008, [place]. Recently seen by [facility] on 10/02/13.  Negative CT scan. Patient now cured.    History of vitamin D deficiency 02/18/2012   Hypercholesterolemia    Hypertension    Hypertension associated with diabetes (HCC)    Hypokalemia 04/10/2020   Medicare annual wellness visit, subsequent 12/02/2021   Memory loss 03/16/2019   Otalgia of both ears 01/03/2015   Renal cyst 06/24/2018   CT AP 2022: Bosniak category 1 and 2 right kidney lesions.     Retina disorder, left 04/22/2015   Seborrheic keratosis 09/03/2015   Throat clearing 09/15/2013   Weight loss 06/24/2018    Patient Active Problem List   Diagnosis Date Noted   Subacute cough 03/18/2023   Bilateral primary osteoarthritis of knee 01/04/2023   Abnormal hearing screen, right ear 10/07/2022   GERD (gastroesophageal reflux disease) 09/28/2022   Hepatic fibrosis 09/02/2021   Autoimmune hepatitis (HCC) 07/28/2021   Hepatic steatosis 10/04/2020   Hypokalemia 04/10/2020   Dementia, mild 04/09/2019   Type 2 diabetes  mellitus without complications (HCC) 03/16/2019   Generalized anxiety disorder 05/11/2018   Major depressive disorder, recurrent episode (HCC) 02/11/2016   Allergic rhinitis 04/22/2015   Knee pain, bilateral 04/09/2014   Hypertension 02/18/2012   Asthma 02/18/2012   Hyperlipidemia     Past Surgical History:  Procedure Laterality Date   Bronchoscopy, left video-assisted thoracoscopy, wedge  05/23/2008   Left thyroid lobectomy, frozen section.  10/11/2002   VAGINAL HYSTERECTOMY  1966   in her 20's     WRIST SURGERY Left 02/15/2017   For carpal tunnel? 2019 is estimate    OB History   No obstetric history on file.      Home Medications    Prior to Admission medications   Medication Sig Start Date End Date Taking? Authorizing Provider  acetaminophen (TYLENOL) 500 MG tablet Take by mouth.    [provider]  atorvastatin (LIPITOR) 40 MG tablet Take 1 tablet (40 mg total) by mouth daily. 11/18/22   [name], DO  benzonatate (TESSALON PERLES) 100 MG capsule Take 1 capsule (100 mg total) by mouth 3 (three) times daily as needed for cough. 03/18/23   Bess Kinds, MD  cetirizine (ZYRTEC) 10 MG tablet Take 0.5 tablets (5 mg total) by mouth daily. 12/22/22   [name], DO  chlorthalidone (HYGROTON) 25 MG tablet Take 1 tablet (25 mg total) by mouth daily. 03/18/23   Bess Kinds, MD  diclofenac Sodium (VOLTAREN) 1 % GEL Apply 2 g topically 4 (four) times daily. 12/09/22   Everhart, Kirstie, DO  famotidine (PEPCID) 40 MG tablet Take 1 tablet by mouth once daily 12/22/22   [name], DO  fluticasone (FLONASE) 50 MCG/ACT nasal spray Place 1 spray into both nostrils daily.    [provider]  mirtazapine (REMERON) 7.5 MG tablet TAKE 1 TABLET BY MOUTH AT BEDTIME 10/22/22   [name], DO  potassium chloride (K-DUR) 10 MEQ tablet Take 1 tablet (10 mEq total) by mouth daily. 02/18/12 10/03/12  Tommie Sams, DO    Family History Family History  Problem Relation Age of Onset   Early death Mother    Hypertension Mother    Stroke Mother    Alzheimer's disease Father    Diabetes Brother    Breast cancer Maternal Aunt    Breast cancer Cousin    Colon cancer Neg Hx     Social History Social History   Tobacco Use   Smoking status: Former    Current packs/day: 0.00    Average packs/day: 2.0 packs/day for 30.0 years (60.0 ttl pk-yrs)    Types: Cigarettes    Start date: 04/16/1963    Quit date: 04/15/1993    Years since quitting: 29.9    Passive exposure:  Past   Smokeless tobacco: Never  Vaping Use   Vaping status: Never Used  Substance Use Topics   Alcohol use: No   Drug use: No     Allergies   Amlodipine, Dexamethasone, Maxzide [triamterene-hctz], and Toprol xl [metoprolol tartrate]   Review of Systems Review of Systems  Constitutional:  Negative for chills and fever.  HENT:  Negative for ear pain and sore throat.   Eyes:  Negative for pain and visual disturbance.  Respiratory:  Positive for cough and shortness of breath.   Cardiovascular:  Negative for chest pain and palpitations.  Gastrointestinal:  Negative for abdominal pain and vomiting.  Genitourinary:  Negative for dysuria and hematuria.  Musculoskeletal:  Negative for arthralgias and back pain.  Skin:  Negative  for color change and rash.  Neurological:  Negative for seizures and syncope.  All other systems reviewed and are negative.    Physical Exam Triage Vital Signs ED Triage Vitals  Encounter Vitals Group     BP 03/20/23 1357 (!) 167/86     Systolic BP Percentile --      Diastolic BP Percentile --      Pulse Rate 03/20/23 1357 (!) 106     Resp 03/20/23 1357 18     Temp 03/20/23 1357 99.1 F (37.3 C)     Temp Source 03/20/23 1357 Oral     SpO2 03/20/23 1357 92 %     Weight --      Height --      Head Circumference --      Peak Flow --      Pain Score 03/20/23 1355 0     Pain Loc --      Pain Education --      Exclude from Growth Chart --    No data found.  Updated Vital Signs BP (!) 167/86 (BP Location: Left Arm)   Pulse (!) 106   Temp 99.1 F (37.3 C) (Oral)   Resp 18   SpO2 92%   Visual Acuity Right Eye Distance:   Left Eye Distance:   Bilateral Distance:    Right Eye Near:   Left Eye Near:    Bilateral Near:     Physical Exam Vitals and nursing note reviewed.  Constitutional:      General: She is not in acute distress.    Appearance: She is well-developed.  HENT:     Head: Normocephalic and atraumatic.  Eyes:      Conjunctiva/sclera: Conjunctivae normal.  Cardiovascular:     Rate and Rhythm: Normal rate and regular rhythm.     Heart sounds: No murmur heard. Pulmonary:     Effort: Pulmonary effort is normal. No tachypnea or respiratory distress.     Breath sounds: Examination of the right-upper field reveals decreased breath sounds. Examination of the left-upper field reveals decreased breath sounds. Decreased breath sounds present. No wheezing, rhonchi or rales.     Comments: Improvement in lung sounds and oxygen level after treatment with albuterol Abdominal:     Palpations: Abdomen is soft.     Tenderness: There is no abdominal tenderness.  Musculoskeletal:        General: No swelling.     Cervical back: Neck supple.  Skin:    General: Skin is warm and dry.     Capillary Refill: Capillary refill takes less than 2 seconds.  Neurological:     General: No focal deficit present.     Mental Status: She is alert.  Psychiatric:        Mood and Affect: Mood normal.      UC Treatments / Results  Labs (all labs ordered are listed, but only abnormal results are displayed) Labs Reviewed - No data to display  EKG   Radiology No results found.  Procedures Procedures (including critical care time)  Medications Ordered in UC Medications  albuterol (VENTOLIN HFA) 108 (90 Base) MCG/ACT inhaler 1 puff (has no administration in time range)    Initial Impression / Assessment and Plan / UC Course  I have reviewed the triage vital signs and the nursing notes.  Pertinent labs & imaging results that were available during my care of the patient were reviewed by me and considered in my medical decision making (see chart for  details).     Bronchitis    X-ray from primary care physician reviewed and although final read is not ready yet, there does not appear to be any pneumonia. Symptoms and physical exam findings most consistent with bronchitis and this has been going on for almost 2 weeks now.   Lung sounds and oxygen level improved after albuterol treatment.  This normally does not require antibiotic treatment.  We will treat with the following: Prednisone 40 mg (2 tablets) daily for 5 days. Take this in the morning.  This is a steroid to help with inflammation Albuterol inhaler 1-2 puffs every 6 hours as needed for wheezing/shortness of breath. First treatment given at 2:30 pm today.  Continue Tessalon pearls as prescribed by primary care. Rest and stay hydrated Return to urgent care or PCP if symptoms worsen or fail to resolve.    Final Clinical Impressions(s) / UC Diagnoses   Final diagnoses:  Bronchitis     Discharge Instructions      Symptoms most consistent with bronchitis.  This normally does not require antibiotic treatment.  We will treat with the following: Prednisone 40 mg (2 tablets) daily for 5 days. Take this in the morning.  This is a steroid to help with inflammation Albuterol inhaler 1-2 puffs every 6 hours as needed for wheezing/shortness of breath. First treatment given at 2:30 pm today.  Rest and stay hydrated Return to urgent care or PCP if symptoms worsen or fail to resolve.     ED Prescriptions   None    PDMP not reviewed this encounter.   Landis Martins, [place] 03/20/23 1456

## 2023-03-20 NOTE — Discharge Instructions (Addendum)
Symptoms most consistent with bronchitis.  This normally does not require antibiotic treatment.  We will treat with the following: Prednisone 40 mg (2 tablets) daily for 5 days. Take this in the morning.  This is a steroid to help with inflammation Albuterol inhaler 1-2 puffs every 6 hours as needed for wheezing/shortness of breath. First treatment given at 2:30 pm today.  Can continue to use the tessalon perles for cough Rest and stay hydrated Return to urgent care or PCP if symptoms worsen or fail to resolve.

## 2023-03-20 NOTE — ED Triage Notes (Addendum)
Pt c/o of  a cough, hoarseness, shortness of breath at night, body aches, and an inability to sleep due to coughing. She saw her doctor Friday and he said she had a cold and ordered a chest x-ray which she got.   Start date: 03/14/2023  Home Interventions: Tessalon pearls

## 2023-03-22 ENCOUNTER — Ambulatory Visit (INDEPENDENT_AMBULATORY_CARE_PROVIDER_SITE_OTHER): Payer: 59 | Admitting: Family Medicine

## 2023-03-22 ENCOUNTER — Encounter: Payer: Self-pay | Admitting: Family Medicine

## 2023-03-22 ENCOUNTER — Other Ambulatory Visit: Payer: Self-pay | Admitting: Student

## 2023-03-22 VITALS — BP 156/74 | HR 95 | Wt 175.0 lb

## 2023-03-22 DIAGNOSIS — R052 Subacute cough: Secondary | ICD-10-CM | POA: Diagnosis not present

## 2023-03-22 DIAGNOSIS — I10 Essential (primary) hypertension: Secondary | ICD-10-CM

## 2023-03-22 MED ORDER — ALBUTEROL SULFATE HFA 108 (90 BASE) MCG/ACT IN AERS: 2.0000 | INHALATION_SPRAY | Freq: Four times a day (QID) | RESPIRATORY_TRACT | 2 refills | Status: DC | PRN

## 2023-03-22 MED ORDER — BUDESONIDE-FORMOTEROL FUMARATE 80-4.5 MCG/ACT IN AERO: 2.0000 | INHALATION_SPRAY | Freq: Two times a day (BID) | RESPIRATORY_TRACT | 3 refills | Status: AC

## 2023-03-22 MED ORDER — FLUTICASONE PROPIONATE 50 MCG/ACT NA SUSP: 1.0000 | Freq: Every day | NASAL | 3 refills | Status: AC

## 2023-03-22 MED ORDER — GUAIFENESIN ER 600 MG PO TB12: 600.0000 mg | ORAL_TABLET | Freq: Two times a day (BID) | ORAL | 0 refills | Status: DC

## 2023-03-22 MED ORDER — AZELASTINE HCL 0.1 % NA SOLN: 2.0000 | Freq: Two times a day (BID) | NASAL | 1 refills | Status: AC

## 2023-03-22 MED ORDER — GUAIFENESIN 100 MG/5ML PO LIQD: 15.0000 mL | Freq: Two times a day (BID) | ORAL | 1 refills | Status: DC

## 2023-03-22 NOTE — Assessment & Plan Note (Signed)
157/74 upon repeat, taking chlorthalidone 25 mg daily.  Persistent coughing during BP measurements possibly making reading falsely elevated.  No BP cuff at home, recommend follow-up in the clinic when symptomatically improved to assess BP further.

## 2023-03-22 NOTE — Assessment & Plan Note (Signed)
 Now seen multiple times for persistent symptoms despite treatment with prednisone , Tessalon  and Flonase .  Most suspicious for viral process triggering worsening asthma symptoms although does not appear to be in exacerbation acutely today.  Given daily albuterol  use and nightly cough ongoing even without illness, would benefit from controller inhaler.  Will treat viral/allergic process symptomatically. -Start Symbicort  80, 2 puffs twice daily -Refill Flonase  and start azelastine  2 sprays in each nostril twice daily for allergic component -Recommend Mucinex  twice daily, small amount of honey with warm fluids and nasal saline rinse if tolerated

## 2023-03-22 NOTE — Patient Instructions (Addendum)
 It was wonderful to see you today! Thank you for choosing [facility].   Please bring ALL of your medications with you to every visit.   Today we talked about:  I am sorry that you have continued to have this nagging cough!  Unfortunately with most viral illnesses the cough can linger for 2 to 3 weeks and even up to a month in significant cases.  We can mostly treat the symptoms as your body continues to try to get over the infection. For symptomatic relief you can use albuterol  inhaler 2 puffs every 4-6 hours for shortness of breath and chest tightness.  If you are having to use it more regularly, let us  know as you may develop an asthma exacerbation.  Given you just had a steroid course, I would not recommend further steroids at this time or antibiotics. You can continue to use the Tessalon  Perles I would recommend starting Mucinex  twice per day.  I would also recommend honey with warm fluids to use caution given you have diabetes.  Please use the Flonase  2 sprays in each nostril daily in addition to this other nasal spray azelastine  that you can use 2 sprays in each nostril twice per day. Your blood pressure is persistently elevated today, please continue to take the chlorthalidone  but I do need you to check your blood pressure at home as you likely need additional medication.  Please follow up for persistent symptoms  If you haven't already, sign up for My Chart to have easy access to your labs results, and communication with your primary care physician.   We are checking some labs today. If they are abnormal, I will call you. If they are normal, I will send you a MyChart message (if it is active) or a letter in the mail. If you do not hear about your labs in the next 2 weeks, please call the office.  Call the clinic at [phone] if your symptoms worsen or you have any concerns.  Please be sure to schedule follow up at the front desk before you leave today.   Izetta Nap, DO Family Medicine

## 2023-03-22 NOTE — Progress Notes (Signed)
    SUBJECTIVE:   CHIEF COMPLAINT / HPI:   Cough Seen on 1/31 for similar symptoms, chest x-ray and Tessalon  Perles were ordered.  Chest x-ray negative for acute process.  Patient was then seen on 03/20/2023 and diagnosed with bronchitis.  She was treated with prednisone  40 mg x 5 days and provided albuterol  inhaler.  Unclear timeline of symptoms as patient has dementia, possibly ongoing for over the past 2-3 weeks.  Reports she has continued to cough all night.  States she has been unable to get sleep due to amount of coughing.  Denies fever.  Daughter present during exam reports she has also been sick.  Denies sore throat.  Denies shortness of breath but occasionally difficult to take a deep breath.  H/o asthma, reports she has used albuterol  inhaler daily and has nightly cough during times when she is not sick.  Former smoker, quit in 1995 but but possibly some component of COPD.  PERTINENT  PMH / PSH: HTN, asthma, allergies, autoimmune hepatitis, GERD, T2DM, dementia, MDD  OBJECTIVE:   BP (!) 162/82   Pulse 95   Wt 175 lb (79.4 kg)   SpO2 93%   BMI 28.25 kg/m   General: NAD, pleasant, able to participate in exam HEENT: TM clear bilaterally.  Not erythematous pharynx.  No palpable cervical lymphadenopathy.  Moist mucous membranes.  Enlarged and erythematous nasal turbinates bilaterally. Cardiac: RRR, no murmurs. Respiratory: Mildly diminished air movement at bases bilaterally.  Persistent coughing throughout exam.  Normal work of breathing on room air. Extremities: no edema or cyanosis. Skin: warm and dry, no rashes noted Neuro: alert, no obvious focal deficits Psych: Normal affect and mood  ASSESSMENT/PLAN:   Assessment & Plan Subacute cough Now seen multiple times for persistent symptoms despite treatment with prednisone , Tessalon  and Flonase .  Most suspicious for viral process triggering worsening asthma symptoms although does not appear to be in exacerbation acutely today.   Given daily albuterol  use and nightly cough ongoing even without illness, would benefit from controller inhaler.  Will treat viral/allergic process symptomatically. -Start Symbicort  80, 2 puffs twice daily -Refill Flonase  and start azelastine  2 sprays in each nostril twice daily for allergic component -Recommend Mucinex  twice daily, small amount of honey with warm fluids and nasal saline rinse if tolerated Primary hypertension 157/74 upon repeat, taking chlorthalidone  25 mg daily.  Persistent coughing during BP measurements possibly making reading falsely elevated.  No BP cuff at home, recommend follow-up in the clinic when symptomatically improved to assess BP further.   Dr. Izetta Nap, DO [facility]

## 2023-03-23 ENCOUNTER — Telehealth: Payer: Self-pay

## 2023-03-23 ENCOUNTER — Encounter: Payer: Self-pay | Admitting: Student

## 2023-03-23 NOTE — Telephone Encounter (Signed)
 Patients daughter calls nurse line in regards to Robitussin.   She reports the pharmacy stated they did not receive this yesterday.   I called the pharmacy and they did receive this, however did not communicate to the patient that this medication can be purchased OTC and therefore her insurance will not cover.   Daughter was appreciative of information and she will purchase OTC.

## 2023-04-01 ENCOUNTER — Ambulatory Visit: Payer: Self-pay | Admitting: Student

## 2023-04-12 ENCOUNTER — Ambulatory Visit (INDEPENDENT_AMBULATORY_CARE_PROVIDER_SITE_OTHER): Payer: 59 | Admitting: Student

## 2023-04-12 VITALS — BP 133/78 | HR 76 | Temp 98.4°F | Wt 174.2 lb

## 2023-04-12 DIAGNOSIS — E118 Type 2 diabetes mellitus with unspecified complications: Secondary | ICD-10-CM | POA: Diagnosis not present

## 2023-04-12 DIAGNOSIS — I10 Essential (primary) hypertension: Secondary | ICD-10-CM

## 2023-04-12 DIAGNOSIS — F03A18 Unspecified dementia, mild, with other behavioral disturbance: Secondary | ICD-10-CM | POA: Diagnosis not present

## 2023-04-12 DIAGNOSIS — F339 Major depressive disorder, recurrent, unspecified: Secondary | ICD-10-CM

## 2023-04-12 DIAGNOSIS — R052 Subacute cough: Secondary | ICD-10-CM

## 2023-04-12 DIAGNOSIS — K219 Gastro-esophageal reflux disease without esophagitis: Secondary | ICD-10-CM | POA: Diagnosis not present

## 2023-04-12 LAB — POCT GLYCOSYLATED HEMOGLOBIN (HGB A1C): HbA1c, POC (controlled diabetic range): 9.3 % — AB (ref 0.0–7.0)

## 2023-04-12 MED ORDER — MIRTAZAPINE 7.5 MG PO TABS: 7.5000 mg | ORAL_TABLET | Freq: Every day | ORAL | 3 refills | Status: DC

## 2023-04-12 MED ORDER — ATORVASTATIN CALCIUM 40 MG PO TABS: 40.0000 mg | ORAL_TABLET | Freq: Every day | ORAL | 3 refills | Status: AC

## 2023-04-12 MED ORDER — DAPAGLIFLOZIN PROPANEDIOL 5 MG PO TABS
5.0000 mg | ORAL_TABLET | Freq: Every day | ORAL | 0 refills | Status: DC
Start: 2023-04-12 — End: 2023-05-11

## 2023-04-12 MED ORDER — FAMOTIDINE 40 MG PO TABS: 40.0000 mg | ORAL_TABLET | Freq: Every day | ORAL | 3 refills | Status: AC

## 2023-04-12 NOTE — Assessment & Plan Note (Signed)
 Refilled mirtazapine.

## 2023-04-12 NOTE — Assessment & Plan Note (Signed)
 Poorly controlled, A1c 9.3.  She has not handled metformin well in the past per patient and daughter.  Start Farxiga 5 mg daily.  Return in 1 month.

## 2023-04-12 NOTE — Assessment & Plan Note (Signed)
Refilled Pepcid

## 2023-04-12 NOTE — Assessment & Plan Note (Signed)
 Provided advanced directive booklet, I will ask faculty for further clarification of guardianship paperwork to assist patient's family.

## 2023-04-12 NOTE — Assessment & Plan Note (Signed)
 Resolved.  Suspect nonadherence to Pepcid played a role.  Discontinue Symbicort.

## 2023-04-12 NOTE — Assessment & Plan Note (Addendum)
 BP: 133/78 today. Well controlled. Goal of <140 SBP. Continue to work on healthy dietary habits and exercise.  Given age and mild impairment, she has increased risk of falls therefore I will discontinue her chlorthalidone while she has normotensive pressures.

## 2023-04-12 NOTE — Patient Instructions (Addendum)
 It was great to see you today! Thank you for choosing [facility] for your primary care.  Today we addressed: Diabetes: You are diet controlled, we are rechecking A1c today. I have sent in your medications. Your blood pressure is well-controlled, I have discontinued your blood pressure medication because of this. I provide an advance directive booklet which discusses healthcare power of attorney.  Until you get to the point of incapacity of medical making decisions (which you are currently not at), guardianship generally is declined.  However the healthcare power of attorney decisions are important prior to this.  If you haven't already, sign up for My Chart to have easy access to your labs results, and communication with your primary care physician.  Return if symptoms worsen or fail to improve. Please arrive 15 minutes before your appointment to ensure smooth check in process.  We appreciate your efforts in making this happen.  Thank you for allowing me to participate in your care, [name], DO 04/12/2023, 10:52 AM PGY-3, [facility]

## 2023-04-12 NOTE — Progress Notes (Signed)
  SUBJECTIVE:   CHIEF COMPLAINT / HPI:   Follow-up for cough.  Reportedly began middle of January.  Seen on 2/2 in urgent care and diagnosed with bronchitis provided prednisone 40 mg x 5 days and albuterol inhaler.  CXR was negative for acute pathology.  She was then seen on 2/4 in our clinic who provided Symbicort 80 mg 2 puffs twice daily and further advised to use Flonase and azelastine spray in addition to Mucinex and honey with nasal saline rinse. Cough has resolved.   Separately, they are requesting medication refills and med rec today.  Susan Ingram (daughter) is requesting guardianship approval.  Patient feels that she is not capable of making financial decisions or medical decisions for herself.  PERTINENT  PMH / PSH: HTN, HLD, asthma, allergies, autoimmune hepatitis, GERD, T2DM, dementia, MDD   OBJECTIVE:  BP 133/78   Pulse 76   Temp 98.4 F (36.9 C)   Wt 174 lb 3.2 oz (79 kg)   SpO2 96%   BMI 28.12 kg/m  General: Well-appearing, NAD CV: RRR, no murmurs auscultated Pulm: CTAB, normal WOB  ASSESSMENT/PLAN:   Assessment & Plan Subacute cough Resolved.  Suspect nonadherence to Pepcid played a role.  Discontinue Symbicort. Type 2 diabetes mellitus with complication, without long-term current use of insulin (HCC) Poorly controlled, A1c 9.3.  She has not handled metformin well in the past per patient and daughter.  Start Farxiga 5 mg daily.  Return in 1 month. Mild dementia with other behavioral disturbance, unspecified dementia type [facility]) Provided advanced directive booklet, I will ask faculty for further clarification of guardianship paperwork to assist patient's family. Primary hypertension BP: 133/78 today. Well controlled. Goal of <140 SBP. Continue to work on healthy dietary habits and exercise.  Given age and mild impairment, she has increased risk of falls therefore I will discontinue her chlorthalidone while she has normotensive pressures. Gastroesophageal reflux  disease, unspecified whether esophagitis present Refilled Pepcid. Episode of recurrent major depressive disorder, unspecified depression episode severity (HCC) Refilled mirtazapine. Return in about 1 month (around 05/10/2023) for Diabetes follow-up. Susan Mattocks, DO 04/12/2023, 11:47 AM PGY-3, [facility]

## 2023-04-14 ENCOUNTER — Telehealth: Payer: Self-pay | Admitting: Student

## 2023-04-14 NOTE — Telephone Encounter (Signed)
 Called patient's daughter x 2 however voicemail box is not set up.  Unable to leave VM.  Called to discuss filing for guardianship as discussed during patient's recent office visit.  See the information below should she call back.  The petitioner (daughter) files a petition for guardianship with the [place] of the Court in the county in which the patient lives.      [facility] DSS has guardianship services which can assist with the process.  The daughter may contact [facility] guardianship services at [phone] to request assistance.

## 2023-04-18 ENCOUNTER — Telehealth: Payer: Self-pay

## 2023-04-18 NOTE — Telephone Encounter (Signed)
-----   Message from [name] sent at 04/18/2023  9:56 AM EST ----- Regarding: FW: Guardianship Please call daughter and inform her of this information.   The petitioner (daughter) files a petition for guardianship with the [place] [employer] [place] [employer] in the county in which the patient lives.      [facility] DSS has guardianship services which can assist with the process.  The daughter may contact [facility] guardianship services at [phone] to request assistance. ----- Message ----- From: McDiarmid, Leighton Roach, MD Sent: 04/13/2023   8:23 AM EST To: [name], DO Subject: RE: Guardianship                               The petitioner (daughter) files a petition for guardianship with the [place] the [place] in the county in which the patient lives.      [facility] DSS has guardianship services which can assist with the process.  The daughter may contact [facility] guardianship services at [phone] to request assistance. ----- Message ----- From: [name], DO Sent: 04/12/2023   5:10 PM EST To: Leighton Roach McDiarmid, MD Subject: Guardianship                                   Patient feels she is unable to make medical decisions and financial decisions for herself.  Her daughter came with her to her medical visit, she was recently diagnosed with mild dementia after seeing you in geriatric clinic.  What is the process to go about getting daughter guardianship approval?

## 2023-04-18 NOTE — Telephone Encounter (Signed)
 Called and spoke to patient's daughter per Dr. Delaney Meigs request about petition for guardianship. Provided the information to the patient's daughter of the name of place and telephone number. Patient's daughter was very Adult nurse.  Drusilla Kanner, CMA

## 2023-04-21 ENCOUNTER — Other Ambulatory Visit (HOSPITAL_COMMUNITY): Payer: Self-pay

## 2023-05-10 ENCOUNTER — Ambulatory Visit: Payer: 59 | Admitting: Student

## 2023-05-10 NOTE — Progress Notes (Deleted)
  SUBJECTIVE:   CHIEF COMPLAINT / HPI:   Type 2 Diabetes: Home medications include: Farxiga 5 mg daily. {Blank single:19197::"Does","Does not"} endorse compliance. Home glucose monitoring {home testing:315145}. Patient is not up to date on diabetic eye.  A1c 1 month ago was 9.3 which is significantly elevated from prior.  PERTINENT  PMH / PSH: HTN, HLD, asthma, allergies, autoimmune hepatitis, GERD, T2DM, dementia, MDD   OBJECTIVE:  There were no vitals taken for this visit. ***  ASSESSMENT/PLAN:   Assessment & Plan  No follow-ups on file. [name], DO 05/10/2023, 8:09 AM PGY-3, [facility] {    This will disappear when note is signed, click to select method of visit    :1}

## 2023-05-11 ENCOUNTER — Other Ambulatory Visit: Payer: Self-pay | Admitting: Student

## 2023-05-11 DIAGNOSIS — E118 Type 2 diabetes mellitus with unspecified complications: Secondary | ICD-10-CM

## 2023-05-16 DIAGNOSIS — K754 Autoimmune hepatitis: Secondary | ICD-10-CM | POA: Diagnosis not present

## 2023-05-16 DIAGNOSIS — K7400 Hepatic fibrosis, unspecified: Secondary | ICD-10-CM | POA: Diagnosis not present

## 2023-05-16 DIAGNOSIS — E119 Type 2 diabetes mellitus without complications: Secondary | ICD-10-CM | POA: Diagnosis not present

## 2023-05-17 ENCOUNTER — Encounter (HOSPITAL_COMMUNITY): Payer: Self-pay | Admitting: *Deleted

## 2023-05-17 ENCOUNTER — Ambulatory Visit: Admitting: Student

## 2023-05-17 ENCOUNTER — Emergency Department (HOSPITAL_COMMUNITY)

## 2023-05-17 ENCOUNTER — Other Ambulatory Visit: Payer: Self-pay

## 2023-05-17 ENCOUNTER — Encounter: Payer: Self-pay | Admitting: Student

## 2023-05-17 ENCOUNTER — Inpatient Hospital Stay (HOSPITAL_COMMUNITY)
Admission: EM | Admit: 2023-05-17 | Discharge: 2023-05-20 | DRG: 638 | Disposition: A | Discharge status: Home Health | Source: Ambulatory Visit | Attending: Family Medicine | Admitting: Family Medicine

## 2023-05-17 VITALS — BP 173/86 | HR 86 | Wt 160.4 lb

## 2023-05-17 DIAGNOSIS — G3184 Mild cognitive impairment, so stated: Secondary | ICD-10-CM | POA: Diagnosis present

## 2023-05-17 DIAGNOSIS — N179 Acute kidney failure, unspecified: Secondary | ICD-10-CM | POA: Diagnosis present

## 2023-05-17 DIAGNOSIS — G479 Sleep disorder, unspecified: Secondary | ICD-10-CM | POA: Diagnosis present

## 2023-05-17 DIAGNOSIS — R410 Disorientation, unspecified: Secondary | ICD-10-CM

## 2023-05-17 DIAGNOSIS — Z888 Allergy status to other drugs, medicaments and biological substances status: Secondary | ICD-10-CM

## 2023-05-17 DIAGNOSIS — Z7984 Long term (current) use of oral hypoglycemic drugs: Secondary | ICD-10-CM | POA: Diagnosis not present

## 2023-05-17 DIAGNOSIS — E785 Hyperlipidemia, unspecified: Secondary | ICD-10-CM | POA: Diagnosis present

## 2023-05-17 DIAGNOSIS — J45909 Unspecified asthma, uncomplicated: Secondary | ICD-10-CM | POA: Diagnosis present

## 2023-05-17 DIAGNOSIS — Z85118 Personal history of other malignant neoplasm of bronchus and lung: Secondary | ICD-10-CM | POA: Diagnosis not present

## 2023-05-17 DIAGNOSIS — K754 Autoimmune hepatitis: Secondary | ICD-10-CM | POA: Diagnosis present

## 2023-05-17 DIAGNOSIS — E860 Dehydration: Secondary | ICD-10-CM | POA: Diagnosis present

## 2023-05-17 DIAGNOSIS — Z79899 Other long term (current) drug therapy: Secondary | ICD-10-CM | POA: Diagnosis not present

## 2023-05-17 DIAGNOSIS — Z87891 Personal history of nicotine dependence: Secondary | ICD-10-CM

## 2023-05-17 DIAGNOSIS — E1110 Type 2 diabetes mellitus with ketoacidosis without coma: Principal | ICD-10-CM | POA: Diagnosis present

## 2023-05-17 DIAGNOSIS — Z7951 Long term (current) use of inhaled steroids: Secondary | ICD-10-CM | POA: Diagnosis not present

## 2023-05-17 DIAGNOSIS — E876 Hypokalemia: Secondary | ICD-10-CM | POA: Diagnosis present

## 2023-05-17 DIAGNOSIS — R739 Hyperglycemia, unspecified: Principal | ICD-10-CM

## 2023-05-17 DIAGNOSIS — Z789 Other specified health status: Secondary | ICD-10-CM

## 2023-05-17 DIAGNOSIS — B370 Candidal stomatitis: Secondary | ICD-10-CM

## 2023-05-17 DIAGNOSIS — R059 Cough, unspecified: Secondary | ICD-10-CM | POA: Diagnosis not present

## 2023-05-17 DIAGNOSIS — I10 Essential (primary) hypertension: Secondary | ICD-10-CM | POA: Diagnosis present

## 2023-05-17 DIAGNOSIS — E1165 Type 2 diabetes mellitus with hyperglycemia: Secondary | ICD-10-CM | POA: Diagnosis not present

## 2023-05-17 LAB — CBC WITH DIFFERENTIAL/PLATELET
Abs Immature Granulocytes: 0.06 10*3/uL (ref 0.00–0.07)
Basophils Absolute: 0.1 10*3/uL (ref 0.0–0.1)
Basophils Relative: 0 %
Eosinophils Absolute: 0 10*3/uL (ref 0.0–0.5)
Eosinophils Relative: 0 %
HCT: 45.8 % (ref 36.0–46.0)
Hemoglobin: 15.3 g/dL — ABNORMAL HIGH (ref 12.0–15.0)
Immature Granulocytes: 1 %
Lymphocytes Relative: 13 %
Lymphs Abs: 1.4 10*3/uL (ref 0.7–4.0)
MCH: 27.8 pg (ref 26.0–34.0)
MCHC: 33.4 g/dL (ref 30.0–36.0)
MCV: 83.1 fL (ref 80.0–100.0)
Monocytes Absolute: 1 10*3/uL (ref 0.1–1.0)
Monocytes Relative: 8 %
Neutro Abs: 8.7 10*3/uL — ABNORMAL HIGH (ref 1.7–7.7)
Neutrophils Relative %: 78 %
Platelets: 291 10*3/uL (ref 150–400)
RBC: 5.51 MIL/uL — ABNORMAL HIGH (ref 3.87–5.11)
RDW: 13.2 % (ref 11.5–15.5)
WBC: 11.3 10*3/uL — ABNORMAL HIGH (ref 4.0–10.5)
nRBC: 0 % (ref 0.0–0.2)

## 2023-05-17 LAB — POCT URINALYSIS DIP (MANUAL ENTRY)
Bilirubin, UA: NEGATIVE
Glucose, UA: >=1000 mg/dL — AB
Leukocytes, UA: NEGATIVE
Nitrite, UA: NEGATIVE
Protein Ur, POC: NEGATIVE mg/dL
Spec Grav, UA: <=1.005 — AB (ref 1.010–1.025)
Urobilinogen, UA: 0.2 U/dL
pH, UA: 5.5 (ref 5.0–8.0)

## 2023-05-17 LAB — BASIC METABOLIC PANEL WITH GFR
Anion gap: 20 — ABNORMAL HIGH (ref 5–15)
Anion gap: 20 — ABNORMAL HIGH (ref 5–15)
Anion gap: 11 (ref 5–15)
BUN: 34 mg/dL — ABNORMAL HIGH (ref 8–23)
BUN: 33 mg/dL — ABNORMAL HIGH (ref 8–23)
BUN: 28 mg/dL — ABNORMAL HIGH (ref 8–23)
CO2: 25 mmol/L (ref 22–32)
CO2: 25 mmol/L (ref 22–32)
CO2: 31 mmol/L (ref 22–32)
Calcium: 10.5 mg/dL — ABNORMAL HIGH (ref 8.9–10.3)
Calcium: 10.8 mg/dL — ABNORMAL HIGH (ref 8.9–10.3)
Calcium: 10.2 mg/dL (ref 8.9–10.3)
Chloride: 83 mmol/L — ABNORMAL LOW (ref 98–111)
Chloride: 87 mmol/L — ABNORMAL LOW (ref 98–111)
Chloride: 92 mmol/L — ABNORMAL LOW (ref 98–111)
Creatinine, Ser: 1.54 mg/dL — ABNORMAL HIGH (ref 0.44–1.00)
Creatinine, Ser: 1.26 mg/dL — ABNORMAL HIGH (ref 0.44–1.00)
Creatinine, Ser: 1.13 mg/dL — ABNORMAL HIGH (ref 0.44–1.00)
GFR, Estimated: 33 mL/min — ABNORMAL LOW (ref >60)
GFR, Estimated: 42 mL/min — ABNORMAL LOW (ref >60)
GFR, Estimated: 48 mL/min — ABNORMAL LOW (ref >60)
Glucose, Bld: 568 mg/dL (ref 70–99)
Glucose, Bld: 279 mg/dL — ABNORMAL HIGH (ref 70–99)
Glucose, Bld: 223 mg/dL — ABNORMAL HIGH (ref 70–99)
Potassium: 3.7 mmol/L (ref 3.5–5.1)
Potassium: 3.8 mmol/L (ref 3.5–5.1)
Potassium: 3.1 mmol/L — ABNORMAL LOW (ref 3.5–5.1)
Sodium: 128 mmol/L — ABNORMAL LOW (ref 135–145)
Sodium: 132 mmol/L — ABNORMAL LOW (ref 135–145)
Sodium: 134 mmol/L — ABNORMAL LOW (ref 135–145)

## 2023-05-17 LAB — POCT UA - MICROSCOPIC ONLY: WBC, Ur, HPF, POC: NONE SEEN (ref 0–5)

## 2023-05-17 LAB — BLOOD GAS, VENOUS
Acid-Base Excess: 9.4 mmol/L — ABNORMAL HIGH (ref 0.0–2.0)
Bicarbonate: 35.4 mmol/L — ABNORMAL HIGH (ref 20.0–28.0)
Drawn by: 8048
O2 Saturation: 26.7 %
Patient temperature: 37
pCO2, Ven: 51 mmHg (ref 44–60)
pH, Ven: 7.45 — ABNORMAL HIGH (ref 7.25–7.43)
pO2, Ven: <31 mmHg — CL (ref 32–45)

## 2023-05-17 LAB — GLUCOSE, CAPILLARY
Glucose-Capillary: 538 mg/dL (ref 70–99)
Glucose-Capillary: 546 mg/dL (ref 70–99)
Glucose-Capillary: 550 mg/dL (ref 70–99)
Glucose-Capillary: 475 mg/dL — ABNORMAL HIGH (ref 70–99)
Glucose-Capillary: 295 mg/dL — ABNORMAL HIGH (ref 70–99)
Glucose-Capillary: 234 mg/dL — ABNORMAL HIGH (ref 70–99)
Glucose-Capillary: 176 mg/dL — ABNORMAL HIGH (ref 70–99)
Glucose-Capillary: 191 mg/dL — ABNORMAL HIGH (ref 70–99)
Glucose-Capillary: 207 mg/dL — ABNORMAL HIGH (ref 70–99)
Glucose-Capillary: 236 mg/dL — ABNORMAL HIGH (ref 70–99)
Glucose-Capillary: 175 mg/dL — ABNORMAL HIGH (ref 70–99)

## 2023-05-17 LAB — BETA-HYDROXYBUTYRIC ACID: Beta-Hydroxybutyric Acid: 2.71 mmol/L — ABNORMAL HIGH (ref 0.05–0.27)

## 2023-05-17 LAB — HEMOGLOBIN A1C
Hgb A1c MFr Bld: 13.4 % — ABNORMAL HIGH (ref 4.8–5.6)
Mean Plasma Glucose: 337.88 mg/dL

## 2023-05-17 LAB — GLUCOSE, POCT (MANUAL RESULT ENTRY): POC Glucose: >600 mg/dL (ref 70–99)

## 2023-05-17 LAB — CBG MONITORING, ED: Glucose-Capillary: 352 mg/dL — ABNORMAL HIGH (ref 70–99)

## 2023-05-17 MED ORDER — POTASSIUM CHLORIDE 10 MEQ/100ML IV SOLN
10.0000 meq | INTRAVENOUS | Status: AC
  Administered 2023-05-17 – 2023-05-18 (×4): 10 meq via INTRAVENOUS
  Filled 2023-05-17 (×4): qty 100

## 2023-05-17 MED ORDER — LACTATED RINGERS IV SOLN: INTRAVENOUS | Status: DC

## 2023-05-17 MED ORDER — POTASSIUM CHLORIDE 10 MEQ/100ML IV SOLN
10.0000 meq | INTRAVENOUS | Status: AC
  Administered 2023-05-17 (×2): 10 meq via INTRAVENOUS
  Filled 2023-05-17 (×2): qty 100

## 2023-05-17 MED ORDER — DEXTROSE 50 % IV SOLN: 0.0000 mL | INTRAVENOUS | Status: DC | PRN

## 2023-05-17 MED ORDER — INSULIN REGULAR(HUMAN) IN NACL 100-0.9 UT/100ML-% IV SOLN
INTRAVENOUS | Status: DC
  Administered 2023-05-17: 7.5 [IU]/h via INTRAVENOUS
  Filled 2023-05-17: qty 100

## 2023-05-17 MED ORDER — DEXTROSE IN LACTATED RINGERS 5 % IV SOLN: INTRAVENOUS | Status: DC

## 2023-05-17 MED ORDER — NYSTATIN 100000 UNIT/ML MT SUSP
5.0000 mL | Freq: Four times a day (QID) | OROMUCOSAL | Status: DC
  Administered 2023-05-17 – 2023-05-20 (×10): 500000 [IU] via ORAL
  Filled 2023-05-17 (×11): qty 5

## 2023-05-17 NOTE — ED Notes (Signed)
Port XR at bedside.

## 2023-05-17 NOTE — ED Notes (Signed)
BS 473

## 2023-05-17 NOTE — Progress Notes (Addendum)
 Critical PO2 <31. Provider made aware.  18 29 provider made aware of BG less than 250. Can we switch to dextrose?  18 40 Dextrose+LR hung

## 2023-05-17 NOTE — ED Notes (Signed)
 MD at bedside.

## 2023-05-17 NOTE — ED Notes (Signed)
CBG 546

## 2023-05-17 NOTE — ED Notes (Signed)
 IV team at bedside

## 2023-05-17 NOTE — Hospital Course (Signed)
 Susan Ingram is a 83 y.o. female who was admitted to the [facility] Iv Medicine Teaching Service at [facility] for DKA. Hospital course is outlined below by problem.   DKA Patient sent from PCP due to hyperglycemia with several weeks of fatigue, anorexia, polyuria and polydipsia.  Per patient's daughter patient had poor p.o. intake and was lying in bed all day.  Daughter reported good medication adherence.  On presentation to the ED, patient was found to have blood glucose in the 500s with anion gap 20 and ketones on UA.  Endo tool started and patient was transitioned to subcutaneous insulin with D5 LR once anion gap closed x 2 and glucose was less than 200.  Potassium was closely monitored and normalized by the time of discharge.***Diabetes educator saw patient while hospitalized.***  Oral thrush Present on admission.  Treated with oral nystatin.  Resolved by day of discharge.***  Hypercalcemia Calcium of 10.5 on admission. Normalized by time of discharge. PTH showed ***. Suspect secondary to ***   Other conditions that were chronic and stable: Hypertension (chlorthalidone held due to normotension), hyperlipidemia (atorvastatin), mild cognitive impairment (mirtazapine)  Issues for follow up: ***

## 2023-05-17 NOTE — ED Notes (Addendum)
 Awaiting potassium and creatinine result from BMP to begin EndoTool. Lab states results will be available in 20 minutes.

## 2023-05-17 NOTE — ED Provider Notes (Signed)
 Susan Ingram EMERGENCY DEPARTMENT AT [facility] Provider Note   CSN: [id number] Arrival date & time: 05/17/23  1133     History Autoimmune hepatitis,DM (on farxiga)  Chief Complaint  Patient presents with   Hyperglycemia    Susan Ingram is a 83 y.o. female.  83 year old female with a past medical history of autoimmune hepatitis, diabetes not on any medication presents to the ED from her PCPs office due to confusion, polydipsia, polyphasia, anorexia.  According to daughter and granddaughter at the bedside who provide most of the history, patient has been feeling rundown over the past 3 weeks since getting over a viral illness.  She has had decrease in oral intake, she had her labs obtained yesterday at her checkup, which show a blood sugar of 700, today at her PCPs office her blood sugar was 600.  According to patient she was taking metformin several years ago but has not been taking this in a while.  Patient does report feeling overall fatigued.  She does have an ongoing dry nonproductive cough since her viral illness 3 weeks ago when her symptoms actually started.  She endorses some nausea, but has not any vomiting.  Has not had any fever, no illness, no recent infection.  The history is provided by the patient.  Hyperglycemia Associated symptoms: fatigue and nausea   Associated symptoms: no abdominal pain, no chest pain, no fever, no shortness of breath and no vomiting        Home Medications Prior to Admission medications   Medication Sig Start Date End Date Taking? Authorizing Provider  albuterol (VENTOLIN HFA) 108 (90 Base) MCG/ACT inhaler Inhale 2 puffs into the lungs every 6 (six) hours as needed for wheezing or shortness of breath. 03/22/23   [provider]  atorvastatin (LIPITOR) 40 MG tablet Take 1 tablet (40 mg total) by mouth daily. 04/12/23   [name], DO  azelastine (ASTELIN) 0.1 % nasal spray Place 2 sprays into both nostrils 2 (two) times  daily. Use in each nostril as directed 03/22/23   Elberta Fortis, MD  budesonide-formoterol [facility]) 80-4.5 MCG/ACT inhaler Inhale 2 puffs into the lungs 2 (two) times daily. 03/22/23   Elberta Fortis, MD  cetirizine (ZYRTEC) 10 MG tablet Take 0.5 tablets (5 mg total) by mouth daily. 12/22/22   [name], DO  chlorthalidone (HYGROTON) 25 MG tablet Take 25 mg by mouth daily.    [provider]  diclofenac Sodium (VOLTAREN) 1 % GEL Apply 2 g topically 4 (four) times daily. 12/09/22   Everhart, Kirstie, DO  famotidine (PEPCID) 40 MG tablet Take 1 tablet (40 mg total) by mouth daily. 04/12/23   [name], DO  FARXIGA 5 MG TABS tablet Take 1 tablet by mouth once daily 05/11/23   [name], DO  fluticasone [facility]) 50 MCG/ACT nasal spray Place 1 spray into both nostrils daily. 03/22/23   Elberta Fortis, MD  mirtazapine (REMERON) 7.5 MG tablet Take 1 tablet (7.5 mg total) by mouth at bedtime. 04/12/23   [name], DO  potassium chloride (K-DUR) 10 MEQ tablet Take 1 tablet (10 mEq total) by mouth daily. 02/18/12 10/03/12  Tommie Sams, DO      Allergies    Amlodipine, Dexamethasone, Maxzide [triamterene-hctz], and Toprol xl [metoprolol tartrate]    Review of Systems   Review of Systems  Constitutional:  Positive for fatigue. Negative for chills and fever.  Respiratory:  Negative for shortness of breath.   Cardiovascular:  Negative for chest pain.  Gastrointestinal:  Positive for nausea. Negative for abdominal pain, constipation, diarrhea and vomiting.  Musculoskeletal:  Negative for back pain.  All other systems reviewed and are negative.   Physical Exam Updated Vital Signs BP (!) 138/58 (BP Location: Right Arm)   Pulse 84   Temp 98.2 F (36.8 C)   Resp 16   Ht 5\' 6"  (1.676 m)   Wt 72.3 kg   SpO2 98%   BMI 25.71 kg/m  Physical Exam Vitals and nursing note reviewed.  Constitutional:      Appearance: Normal appearance.  HENT:     Head: Normocephalic and  atraumatic.     Nose: No congestion.     Mouth/Throat:     Mouth: Mucous membranes are dry.     Pharynx: Posterior oropharyngeal erythema present. No oropharyngeal exudate.  Eyes:     Pupils: Pupils are equal, round, and reactive to light.  Cardiovascular:     Rate and Rhythm: Normal rate.  Pulmonary:     Effort: Pulmonary effort is normal.  Abdominal:     General: Abdomen is flat.     Palpations: Abdomen is soft.     Tenderness: There is no abdominal tenderness. There is no right CVA tenderness, left CVA tenderness, guarding or rebound.  Musculoskeletal:     Cervical back: Normal range of motion and neck supple.     Right lower leg: No edema.     Left lower leg: No edema.  Skin:    General: Skin is warm and dry.  Neurological:     Mental Status: She is alert and oriented to person, place, and time.     ED Results / Procedures / Treatments   Labs (all labs ordered are listed, but only abnormal results are displayed) Labs Reviewed  BASIC METABOLIC PANEL WITH GFR - Abnormal; Notable for the following components:      Result Value   Sodium 128 (*)    Chloride 83 (*)    Glucose, Bld 568 (*)    BUN 34 (*)    Creatinine, Ser 1.54 (*)    Calcium 10.5 (*)    GFR, Estimated 33 (*)    Anion gap 20 (*)    All other components within normal limits  CBC WITH DIFFERENTIAL/PLATELET - Abnormal; Notable for the following components:   WBC 11.3 (*)    RBC 5.51 (*)    Hemoglobin 15.3 (*)    Neutro Abs 8.7 (*)    All other components within normal limits  CBC  URINALYSIS, ROUTINE W REFLEX MICROSCOPIC  BETA-HYDROXYBUTYRIC ACID  CBG MONITORING, ED    EKG None  Radiology No results found.  Procedures .Critical Care  Performed by: Claude Manges, PA-C Authorized by: Claude Manges, PA-C   Critical care provider statement:    Critical care time (minutes):  45   Critical care start time:  05/17/2023 1:00 PM   Critical care end time:  05/17/2023 1:45 PM   Critical care was necessary  to treat or prevent imminent or life-threatening deterioration of the following conditions:  Endocrine crisis   Critical care was time spent personally by me on the following activities:  Discussions with consultants and discussions with primary provider   Care discussed with: admitting provider       Medications Ordered in ED Medications  insulin regular, human (MYXREDLIN) 100 units/ 100 mL infusion (7.5 Units/hr Intravenous Infusion Verify 05/17/23 1435)  lactated ringers infusion ( Intravenous New Bag/Given 05/17/23 1236)  dextrose 5 % in lactated  ringers infusion ( Intravenous Not Given 05/17/23 1236)  dextrose 50 % solution 0-50 mL (has no administration in time range)    ED Course/ Medical Decision Making/ A&P Clinical Course as of 05/17/23 1441  Tue May 17, 2023  1337 Glucose(!!): 568 [JS]  1337 Anion gap(!): 20 [JS]  1337 WBC(!): 11.3 [JS]    Clinical Course User Index [JS] Claude Manges, PA-C                                 Medical Decision Making Amount and/or Complexity of Data Reviewed Labs: ordered. Decision-making details documented in ED Course. Radiology: ordered.  Risk Prescription drug management.      Patient presents to the ED with a chief complaint of polyuria, polydipsia, confusion for the past several weeks.  History primarily obtained by the daughter at the bedside along with the granddaughter.  Patient was evaluated by PCP today, found to be hyperglycemic with i-STAT glucose around 600, previously evaluated by her hepatologist yesterday and had a blood sugar of 700 which she was called for today.  Patient reports she discontinued metformin several years ago as this was causing her nausea.  She is on [place] according to records.  On today's visit, does appear dry, conjunctiva is somewhat pale, her oropharynx are dry.  She is requesting food along with something to drink.  According to chart review, she was requesting juice at her PCPs office, denies really  taking much water in.  Interpretation of her blood work by me reveal a CBC with a leukocytosis of 11.3, anion gap 20, CMP with a glucose of 568, she has an elevated BUN of 34, creatinine level remarkable for AKI with a creatinine of 1.5, anion gap is 20, some suspicion for DKA.  UA was obtained, she does have some urinary frequency, some suspicion of infection causing her hyperglycemia.  Patient was given hydration, along with insulin to help with her symptoms, I do feel that patient meets admission for criteria as she was previously been a well-controlled diabetic.  Daughter at the bedside agrees with plan and treatment. Patient stable for admission.  2:40 PM established family practice patient, she will be admitted by their team.  Remains stable.  Portions of this note were generated with Scientist, clinical (histocompatibility and immunogenetics). Dictation errors may occur despite best attempts at proofreading.   Final Clinical Impression(s) / ED Diagnoses Final diagnoses:  Hyperglycemia  AKI (acute kidney injury) [facility])    Rx / DC Orders ED Discharge Orders     None         Claude Manges, PA-C 05/17/23 1441    Pricilla Loveless, MD 05/17/23 1504

## 2023-05-17 NOTE — Plan of Care (Signed)
 FMTS Interim Progress Note  S: Doing well. No N/V/D. No headache or dizziness. Mouth feels dry.  O: BP 133/70 (BP Location: Right Arm)   Pulse (!) 104   Temp 98.3 F (36.8 C) (Oral)   Resp 18   Ht 5\' 6"  (1.676 m)   Wt 72.3 kg   SpO2 (!) 80%   BMI 25.73 kg/m   General: Well-appearing. Resting comfortably in room. CV: Normal S1/S2. No extra heart sounds. Warm and well-perfused. Pulm: Breathing comfortably on room air. CTAB. No increased WOB. Abd: Soft, non-tender, non-distended. Skin/Ext:  Warm, dry. No LE edema.   A/P: DKA Patient stable and well-appearing at this time on Endotool. BG recently hi 100- lo 200s. Most recent AG 20 > 20.  - Endotool per protocol - transition to subQ insulin as indicated - D5LR & LR IVF - BMP q4 - replete electrolytes as indicated  - NPO while on Endotool  - Offer ice chips for comfort   Continue treatment plan as otherwise indicated in FMTS Progress Note.   Ivery Quale, MD 05/17/2023, 8:51 PM PGY-1, [facility] Family Medicine Service pager [phone]

## 2023-05-17 NOTE — ED Notes (Signed)
CBG 550

## 2023-05-17 NOTE — H&P (Signed)
 Hospital Admission History and Physical Service Pager: [phone]  Patient name: Susan Ingram Medical record number: [id number] Date of Birth: 07-29-1940 Age: 83 y.o. Gender: female  Primary Care Provider: [name], DO Consultants: None Code Status: Full Code   Preferred Emergency Contact:  Contact Information     Name Relation Home Work [place] Daughter   [phone]   Hervey Ard   [phone]      Other Contacts   None on File      Chief Complaint: Hyperglycemia   Assessment and Plan: Susan Ingram is a 83 y.o. female presenting with decreased appetite, fatigue, polydipsia. Differential for this patient's presentation of this includes DKA, HHS, viral infection. Patient recently started on farxiga after A1C increase from 7s to 9.3 last month, patient may be poorly controlled on current regimen. Given hyperglycemia with glucose in the 500s with anion gap of 20 with ketones in urine, likely DKA. Reassuringly, patient with appropriate mentation and well appearing on exam, though dry. Will start patient on Endotool and titrate insulin as able.   Assessment & Plan DKA (diabetic ketoacidosis) (HCC) Presented with serum glucose in 500s, K 3.7, AG 20, UA with ketones. Started on Endotool in the ED. Home medications include farxiga 5mg  which was started last month.  - Admit to inpatient, Attending: Dr. Lum Babe - Start endotool with fluids -Diabetes coordinator consulted, appreciate recommendations - NPO -POC CBG checks per endotool, will transition to subcutaneous insulin and D5-LR once gap is closed x2 and glucose is <200 mg/dL - VBG, BHB, G2X pending  - Monitor BMP q4h until gap closes x2   Chronic and Stable Conditions: HTN: holding home chlorthalidone 25mg  for now, consider adding back when able to PO HLD: holding home atorvastatin, consider adding back when able to PO Mild Cognitive Impairment: Holding home remeron, add  back with able to PO  FEN/GI: NPO VTE Prophylaxis: Lovenox  Disposition: Progressive  History of Present Illness:  Susan Ingram is a 83 y.o. female presenting  From PCP office due to hyperglycemia with weeks of fatigue, anorexia, polyuria and polydipsia.  Collateral obtained from daughter who reports patient was diagnosed with bronch colitis in January and took a little while to recover from that.  She reports she started to feel better and was not having any cough symptoms, however daughter noted that over the past few weeks patient appears more fatigued and confused.  She has been laying in bed all day, does not have interest in food.  Daughter notes patient has history of mild cognitive impairment so she is unsure what is patient's baseline.  Patient saw PCP last month and was started on Farxiga 5 mg daily due to increase in A1c from low sevens to 9.3.  Daughter reports patient has been having increased intake of sweets.  Daughter reports she gives patient medications daily.  She used to give patient's medications and leave the room, but noted pills on the floor so started watching her take her medications.  Daughter reports adherence to medications daily.   Review Of Systems: Per HPI  Pertinent Past Medical History: Autoimmune hepatitis Diabetes HTN HLD Asthma Hx of lung cancer 2012 s/p segmentectomy  Remainder reviewed in history tab.   Pertinent Past Surgical History: Lung surgery Carpal tunnel B[place]yon surgery  Thyroid goiter removal    Remainder reviewed in history tab.  Pertinent Social History: Tobacco use: Former-  Alcohol use: former, currently  Other Substance use: None  Lives with  daughter   Pertinent Family History: N/A  Remainder reviewed in history tab.   Important Outpatient Medications: Farxiga 5mg  Lipitor 40 mg Zyrtec Chlorothalidone 25 mg Pepcid  Flonase Remeron  Remainder reviewed in medication history.   Objective: BP (!) 138/58 (BP  Location: Right Arm)   Pulse 84   Temp 98.2 F (36.8 C)   Resp 16   Ht 5\' 6"  (1.676 m)   Wt 72.3 kg   SpO2 98%   BMI 25.71 kg/m  Exam: General: laying in bed comfortably, NAD  HEENT: Atraumatic, normocephalic ,dry mucous membranes Cardiovascular: Regular rate and rhythm, no murmurs rubs or gallops Respiratory: Normal work of breathing, clear to auscultation bilaterally Gastrointestinal: Soft nontender nondistended Neuro: Alert and oriented, no gross neurological deficits on exam Psych: Pleasant, appropriate mood and affect  Labs:  CBC BMET  Recent Labs  Lab 05/17/23 1233  WBC 11.3*  HGB 15.3*  HCT 45.8  PLT 291   Recent Labs  Lab 05/17/23 1233  NA 128*  K 3.7  CL 83*  CO2 25  BUN 34*  CREATININE 1.54*  GLUCOSE 568*  CALCIUM 10.5*     Urinalysis Component Ref Range & Units (hover) 10:50 2 yr ago 12 yr ago 14 yr ago  Color, UA yellow Yellow R    Clarity, UA clear     Glucose, UA >=1,000 Abnormal      Bilirubin, UA negative Negative R    Ketones, POC UA moderate (40) Abnormal  Negative R    Spec Grav, UA <=1.005 Abnormal  1.018 R    Blood, UA trace-intact Abnormal  Negative R    pH, UA 5.5 7.5 R    Protein Ur, POC negative Negative R    Urobilinogen, UA 0.2  1.0 R 1.0 R  Nitrite, UA Negative Negative    Leukocytes, UA Negative Negative LARGE Abnormal  R SMALL Abnormal  R   CBG (last 3)  Recent Labs    05/17/23 1344 05/17/23 1431 05/17/23 1619  GLUCAP 550* 475* 352*    Imaging Studies Performed: CXR:  No active cardiopulmonary disease  Richey Doolittle, MD 05/17/2023, 2:26 PM PGY-1, [facility]  FPTS Intern pager: [phone], text pages welcome Secure chat group [facility] Teaching Service

## 2023-05-17 NOTE — ED Notes (Signed)
CBG 352  

## 2023-05-17 NOTE — Progress Notes (Signed)
  SUBJECTIVE:   CHIEF COMPLAINT / HPI:   The patient, with a history of diabetes, presents with weeks of fatigue, anorexia, polyuria, and polydipsia. She has been sleeping all day, has no energy, and does not want to get out of bed. She has been drinking water, but not enough, and has a preference for ginger ale. She has been taking [place] for her diabetes, but has not been checking her blood glucose at home. She has also been vomiting. Her blood glucose today is in the six hundreds. Her A1c was 7.0 a few months ago, but increased to 9.3 last month. She has also been dizzy and confused, not knowing what day it is and asking the same question multiple times.  PERTINENT  PMH / PSH: HTN, HLD, asthma, allergies, autoimmune hepatitis, GERD, T2DM, dementia, MDD   OBJECTIVE:  BP (!) 173/86   Pulse 86   Wt 160 lb 6.4 oz (72.8 kg)   BMI 25.89 kg/m  General: Ill-appearing, fatigued CV: RRR, no murmurs auscultated Extremities: No pitting edema BLEs  ASSESSMENT/PLAN:   Assessment & Plan Confusion Severe hyperglycemia with blood glucose in the 600s and symptoms indicative of DKA, including fatigue, polydipsia, polyuria, vomiting, and confusion. A1c increased from low 7s to 9.3 over the past month, indicating worsening glycemic control. Concern for inadequate insulin production leading to hyperglycemia and potential acidosis. Immediate intervention required to prevent complications such as acidosis, dehydration, and delirium. Immediate transfer to the ED is necessary for IV insulin and fluids, with high likelihood of hospital admission for monitoring and stabilization. - Transfer to the emergency department for evaluation and treatment.  Susan Mattocks, DO 05/17/2023, 11:14 AM PGY-3, [facility]

## 2023-05-17 NOTE — ED Notes (Signed)
 Unsuccessful IV attempt x2 by two RN

## 2023-05-17 NOTE — Plan of Care (Signed)
 New admission for DKA on insulin drip. Q1 BG checks. NPO< cardiac monitoring.   Problem: Education: Goal: Ability to describe self-care measures that may prevent or decrease complications (Diabetes Survival Skills Education) will improve Outcome: Not Met (add Reason) Goal: Individualized Educational Video(s) Outcome: Not Met (add Reason)   Problem: Education: Goal: Ability to describe self-care measures that may prevent or decrease complications (Diabetes Survival Skills Education) will improve Outcome: Not Met (add Reason) Goal: Individualized Educational Video(s) Outcome: Not Met (add Reason)   Problem: Safety: Goal: Ability to remain free from injury will improve Outcome: Not Met (add Reason)

## 2023-05-17 NOTE — ED Notes (Signed)
CBG 538. 

## 2023-05-17 NOTE — Assessment & Plan Note (Signed)
 Presented with serum glucose in 500s, K 3.7, AG 20, UA with ketones. Started on Endotool in the ED. Home medications include farxiga 5mg  which was started last month.  - Admit to inpatient, Attending: Dr. Lum Babe - Start endotool with fluids -Diabetes coordinator consulted, appreciate recommendations - NPO -POC CBG checks per endotool, will transition to subcutaneous insulin and D5-LR once gap is closed x2 and glucose is <200 mg/dL - VBG, BHB, N8G pending  - Monitor BMP q4h until gap closes x2

## 2023-05-17 NOTE — ED Notes (Signed)
BS 376.

## 2023-05-17 NOTE — ED Triage Notes (Signed)
 Patient arrives via [facility] for hyperglycemia from [facility]. Sugar read 600 at office, EMS sugar 547. Patient is alert and oriented x4. Patient has hx of diabetes, HTN, lung cancer, and hepatic fibrosis.  EMS VS BP 138/86 HR 68 RR 18 O2 94 on room air

## 2023-05-18 ENCOUNTER — Telehealth (HOSPITAL_COMMUNITY): Payer: Self-pay | Admitting: Pharmacy Technician

## 2023-05-18 ENCOUNTER — Other Ambulatory Visit (HOSPITAL_COMMUNITY): Payer: Self-pay

## 2023-05-18 DIAGNOSIS — Z789 Other specified health status: Secondary | ICD-10-CM

## 2023-05-18 DIAGNOSIS — E1110 Type 2 diabetes mellitus with ketoacidosis without coma: Secondary | ICD-10-CM | POA: Diagnosis not present

## 2023-05-18 LAB — BASIC METABOLIC PANEL WITH GFR
Anion gap: 9 (ref 5–15)
Anion gap: 12 (ref 5–15)
Anion gap: 12 (ref 5–15)
BUN: 24 mg/dL — ABNORMAL HIGH (ref 8–23)
BUN: 19 mg/dL (ref 8–23)
BUN: 17 mg/dL (ref 8–23)
CO2: 30 mmol/L (ref 22–32)
CO2: 29 mmol/L (ref 22–32)
CO2: 26 mmol/L (ref 22–32)
Calcium: 9.8 mg/dL (ref 8.9–10.3)
Calcium: 9.9 mg/dL (ref 8.9–10.3)
Calcium: 10.3 mg/dL (ref 8.9–10.3)
Chloride: 91 mmol/L — ABNORMAL LOW (ref 98–111)
Chloride: 93 mmol/L — ABNORMAL LOW (ref 98–111)
Chloride: 94 mmol/L — ABNORMAL LOW (ref 98–111)
Creatinine, Ser: 1.06 mg/dL — ABNORMAL HIGH (ref 0.44–1.00)
Creatinine, Ser: 0.95 mg/dL (ref 0.44–1.00)
Creatinine, Ser: 1.06 mg/dL — ABNORMAL HIGH (ref 0.44–1.00)
GFR, Estimated: 52 mL/min — ABNORMAL LOW (ref >60)
GFR, Estimated: 59 mL/min — ABNORMAL LOW (ref >60)
GFR, Estimated: 52 mL/min — ABNORMAL LOW (ref >60)
Glucose, Bld: 190 mg/dL — ABNORMAL HIGH (ref 70–99)
Glucose, Bld: 136 mg/dL — ABNORMAL HIGH (ref 70–99)
Glucose, Bld: 270 mg/dL — ABNORMAL HIGH (ref 70–99)
Potassium: 2.9 mmol/L — ABNORMAL LOW (ref 3.5–5.1)
Potassium: 3 mmol/L — ABNORMAL LOW (ref 3.5–5.1)
Potassium: 4.2 mmol/L (ref 3.5–5.1)
Sodium: 130 mmol/L — ABNORMAL LOW (ref 135–145)
Sodium: 134 mmol/L — ABNORMAL LOW (ref 135–145)
Sodium: 132 mmol/L — ABNORMAL LOW (ref 135–145)

## 2023-05-18 LAB — CBC
HCT: 38 % (ref 36.0–46.0)
Hemoglobin: 13.1 g/dL (ref 12.0–15.0)
MCH: 27.9 pg (ref 26.0–34.0)
MCHC: 34.5 g/dL (ref 30.0–36.0)
MCV: 81 fL (ref 80.0–100.0)
Platelets: 255 10*3/uL (ref 150–400)
RBC: 4.69 MIL/uL (ref 3.87–5.11)
RDW: 13 % (ref 11.5–15.5)
WBC: 11 10*3/uL — ABNORMAL HIGH (ref 4.0–10.5)
nRBC: 0 % (ref 0.0–0.2)

## 2023-05-18 LAB — GLUCOSE, CAPILLARY
Glucose-Capillary: 169 mg/dL — ABNORMAL HIGH (ref 70–99)
Glucose-Capillary: 171 mg/dL — ABNORMAL HIGH (ref 70–99)
Glucose-Capillary: 171 mg/dL — ABNORMAL HIGH (ref 70–99)
Glucose-Capillary: 175 mg/dL — ABNORMAL HIGH (ref 70–99)
Glucose-Capillary: 181 mg/dL — ABNORMAL HIGH (ref 70–99)
Glucose-Capillary: 181 mg/dL — ABNORMAL HIGH (ref 70–99)
Glucose-Capillary: 193 mg/dL — ABNORMAL HIGH (ref 70–99)
Glucose-Capillary: 196 mg/dL — ABNORMAL HIGH (ref 70–99)
Glucose-Capillary: 208 mg/dL — ABNORMAL HIGH (ref 70–99)
Glucose-Capillary: 305 mg/dL — ABNORMAL HIGH (ref 70–99)
Glucose-Capillary: 349 mg/dL — ABNORMAL HIGH (ref 70–99)
Glucose-Capillary: 376 mg/dL — ABNORMAL HIGH (ref 70–99)
Glucose-Capillary: 396 mg/dL — ABNORMAL HIGH (ref 70–99)

## 2023-05-18 LAB — PARATHYROID HORMONE, INTACT (NO CA): PTH: 9 pg/mL — ABNORMAL LOW (ref 15–65)

## 2023-05-18 MED ORDER — ALBUMIN HUMAN 5 % IV SOLN: 25.0000 g | Freq: Once | INTRAVENOUS | Status: DC

## 2023-05-18 MED ORDER — FAMOTIDINE 20 MG PO TABS
40.0000 mg | ORAL_TABLET | Freq: Every day | ORAL | Status: DC
  Administered 2023-05-18 – 2023-05-20 (×3): 40 mg via ORAL
  Filled 2023-05-18 (×3): qty 2

## 2023-05-18 MED ORDER — ENOXAPARIN SODIUM 40 MG/0.4ML IJ SOSY
40.0000 mg | PREFILLED_SYRINGE | INTRAMUSCULAR | Status: DC
  Administered 2023-05-18 – 2023-05-20 (×3): 40 mg via SUBCUTANEOUS
  Filled 2023-05-18 (×3): qty 0.4

## 2023-05-18 MED ORDER — LIVING WELL WITH DIABETES BOOK
Freq: Once | Status: AC
  Filled 2023-05-18: qty 1

## 2023-05-18 MED ORDER — INSULIN ASPART 100 UNIT/ML IJ SOLN
0.0000 [IU] | Freq: Every day | INTRAMUSCULAR | Status: DC
  Administered 2023-05-18: 4 [IU] via SUBCUTANEOUS
  Administered 2023-05-19: 3 [IU] via SUBCUTANEOUS

## 2023-05-18 MED ORDER — POLYETHYLENE GLYCOL 3350 17 G PO PACK
17.0000 g | PACK | Freq: Every day | ORAL | Status: DC
  Administered 2023-05-18: 17 g via ORAL
  Filled 2023-05-18 (×3): qty 1

## 2023-05-18 MED ORDER — ATORVASTATIN CALCIUM 40 MG PO TABS
40.0000 mg | ORAL_TABLET | Freq: Every day | ORAL | Status: DC
Start: 2023-05-18 — End: 2023-05-20
  Administered 2023-05-18 – 2023-05-20 (×3): 40 mg via ORAL
  Filled 2023-05-18 (×3): qty 1

## 2023-05-18 MED ORDER — INSULIN GLARGINE-YFGN 100 UNIT/ML ~~LOC~~ SOLN
20.0000 [IU] | Freq: Every day | SUBCUTANEOUS | Status: DC
  Administered 2023-05-19: 20 [IU] via SUBCUTANEOUS
  Filled 2023-05-18: qty 0.2

## 2023-05-18 MED ORDER — INSULIN GLARGINE-YFGN 100 UNIT/ML ~~LOC~~ SOLN
15.0000 [IU] | Freq: Every day | SUBCUTANEOUS | Status: DC
  Administered 2023-05-18: 15 [IU] via SUBCUTANEOUS
  Filled 2023-05-18 (×2): qty 0.15

## 2023-05-18 MED ORDER — POTASSIUM CHLORIDE CRYS ER 20 MEQ PO TBCR
40.0000 meq | EXTENDED_RELEASE_TABLET | Freq: Once | ORAL | Status: AC
  Administered 2023-05-18: 40 meq via ORAL
  Filled 2023-05-18: qty 2

## 2023-05-18 MED ORDER — INSULIN ASPART 100 UNIT/ML IJ SOLN
0.0000 [IU] | Freq: Three times a day (TID) | INTRAMUSCULAR | Status: DC
  Administered 2023-05-18: 9 [IU] via SUBCUTANEOUS
  Administered 2023-05-18: 7 [IU] via SUBCUTANEOUS
  Administered 2023-05-19 (×2): 9 [IU] via SUBCUTANEOUS
  Administered 2023-05-19: 5 [IU] via SUBCUTANEOUS
  Administered 2023-05-20: 9 [IU] via SUBCUTANEOUS
  Administered 2023-05-20: 5 [IU] via SUBCUTANEOUS

## 2023-05-18 MED ORDER — INSULIN STARTER KIT- PEN NEEDLES (ENGLISH)
1.0000 | Freq: Once | Status: AC
  Administered 2023-05-18: 1
  Filled 2023-05-18: qty 1

## 2023-05-18 MED ORDER — MOMETASONE FURO-FORMOTEROL FUM 100-5 MCG/ACT IN AERO
2.0000 | INHALATION_SPRAY | Freq: Two times a day (BID) | RESPIRATORY_TRACT | Status: DC
  Administered 2023-05-18 – 2023-05-20 (×4): 2 via RESPIRATORY_TRACT
  Filled 2023-05-18: qty 8.8

## 2023-05-18 MED ORDER — MIRTAZAPINE 15 MG PO TABS
7.5000 mg | ORAL_TABLET | Freq: Every day | ORAL | Status: DC
  Administered 2023-05-18 – 2023-05-19 (×2): 7.5 mg via ORAL
  Filled 2023-05-18 (×2): qty 1

## 2023-05-18 MED ORDER — POTASSIUM CHLORIDE CRYS ER 20 MEQ PO TBCR
40.0000 meq | EXTENDED_RELEASE_TABLET | ORAL | Status: AC
  Administered 2023-05-18 (×2): 40 meq via ORAL
  Filled 2023-05-18 (×2): qty 2

## 2023-05-18 NOTE — Plan of Care (Signed)
   Problem: Fluid Volume: Goal: Ability to maintain a balanced intake and output will improve Outcome: Progressing   Problem: Health Behavior/Discharge Planning: Goal: Ability to manage health-related needs will improve Outcome: Progressing

## 2023-05-18 NOTE — Assessment & Plan Note (Signed)
 Continue treatment with oral nystatin suspension.

## 2023-05-18 NOTE — Progress Notes (Signed)
 Dr. Ardyth Harps notified of K+=3.1 as instructed by endotool.  New orders placed in [facility] to be carried out

## 2023-05-18 NOTE — Assessment & Plan Note (Addendum)
 Appears to be resolving. A1C 13.4 - Continue 15U semglee with sSSI for today, will monitor and adjust as needed - CBG QID with meals and bedtime - AM BMP, replenish electrolytes prn

## 2023-05-18 NOTE — Plan of Care (Signed)

## 2023-05-18 NOTE — Assessment & Plan Note (Signed)
 PTH pending, appears resolved from AM labs.

## 2023-05-18 NOTE — Progress Notes (Signed)
 Dr. Ardyth Harps notified of K+=2.9 as instructed by endotool. No new orders given at this time.

## 2023-05-18 NOTE — Assessment & Plan Note (Signed)
 K 2.9 this AM. Repleted - PM BMP, will replete as needed

## 2023-05-18 NOTE — Telephone Encounter (Signed)
 Patient Product/process development scientist completed.    The patient is insured through [facility]. Patient has Medicare and is not eligible for a copay card, but may be able to apply for patient assistance or Medicare RX Payment Plan (Patient Must reach out to their plan, if eligible for payment plan), if available.    Ran test claim for Lantus Pen and the current 30 day co-pay is $0.00.  Ran test claim for Humalog KwikPen and the current 30 day co-pay is $0.00.  This test claim was processed through [facility]- copay amounts may vary at other pharmacies due to pharmacy/plan contracts, or as the patient moves through the different stages of their insurance plan.     Roland Earl, CPHT Pharmacy Technician III Certified Patient Advocate [facility] Patient Advocate Team Direct Number: [phone]  Fax: [phone]

## 2023-05-18 NOTE — Assessment & Plan Note (Addendum)
 HTN: BP wnl, will hold chlorthalidone for now HLD: continue atorvastatin  Mild Cognitive Impairment: continue mirtazapine

## 2023-05-18 NOTE — Progress Notes (Signed)
     Daily Progress Note Intern Pager: [phone]  Patient name: Susan Ingram Medical record number: [id number] Date of birth: 1940-08-25 Age: 83 y.o. Gender: female  Primary Care Provider: [name], DO Consultants: None Code Status: Full  Pt Overview and Major Events to Date:  4/1: admitted   Assessment and Plan: Patient is a 83 yo F with PMHx of autoimmune hepatitis, DMII, HTN, HLD admitted for DKA. Anion gap closed and patient has transitioned to subcutaneous insulin.   Assessment & Plan DKA (diabetic ketoacidosis) (HCC) Appears to be resolving. A1C 13.4 - Continue 15U semglee with sSSI for today, will monitor and adjust as needed - CBG QID with meals and bedtime - AM BMP, replenish electrolytes prn  Oral thrush Continue treatment with oral nystatin suspension.  Hypercalcemia PTH pending, appears resolved from AM labs.  Chronic health problem HTN: BP wnl, will hold chlorthalidone for now HLD: continue atorvastatin  Mild Cognitive Impairment: continue mirtazapine  Hypokalemia K 2.9 this AM. Repleted - PM BMP, will replete as needed   FEN/GI: Carb modified  PPx: Lovenox Dispo:likely home pending clinical improvement   Subjective:  Feeling much better, is excited to eat.   Objective: Temp:  [98.2 F (36.8 C)-99 F (37.2 C)] 98.6 F (37 C) (04/02 0329) Pulse Rate:  [53-104] 53 (04/02 0329) Resp:  [10-18] 18 (04/02 0329) BP: (125-173)/(58-86) 125/78 (04/02 0329) SpO2:  [90 %-100 %] 100 % (04/02 0329) Weight:  [72.3 kg-72.8 kg] 72.3 kg (04/01 1700) Physical Exam: General: NAD, resting comfortably Cardiovascular: RRR, no m/r/g Respiratory: CTAB, NWOB on RA Abdomen: soft, NTND Extremities: no LE edema   Laboratory: Most recent CBC Lab Results  Component Value Date   WBC 11.0 (H) 05/18/2023   HGB 13.1 05/18/2023   HCT 38.0 05/18/2023   MCV 81.0 05/18/2023   PLT 255 05/18/2023   Most recent BMP    Latest Ref Rng & Units 05/18/2023    2:26 AM   BMP  Glucose 70 - 99 mg/dL 130   BUN 8 - 23 mg/dL 24   Creatinine 8.65 - 1.00 mg/dL 7.84   Sodium 696 - 295 mmol/L 130   Potassium 3.5 - 5.1 mmol/L 2.9   Chloride 98 - 111 mmol/L 91   CO2 22 - 32 mmol/L 30   Calcium 8.9 - 10.3 mg/dL 9.8        Latest Ref Rng & Units 05/18/2023    2:26 AM 05/17/2023   12:33 PM 09/28/2022    4:14 PM  CBC  WBC 4.0 - 10.5 K/uL 11.0  11.3  5.6   Hemoglobin 12.0 - 15.0 g/dL 28.4  13.2  44.0   Hematocrit 36.0 - 46.0 % 38.0  45.8  39.3   Platelets 150 - 400 K/uL 255  291  248      Imaging/Diagnostic Tests: No new imaging  Penne Lash, MD 05/18/2023, 8:13 AM  PGY-1, [place] [facility] FPTS Intern pager: [phone], text pages welcome Secure chat group [facility] Teaching Service

## 2023-05-18 NOTE — Discharge Instructions (Addendum)
 Dear Susan Ingram,   Thank you so much for allowing [place] to be part of your care!  You were admitted to [facility] for Diabetic Ketoacidosis. You were started on IV insulin and IV fluids. We closely followed your labwork and you transitioned to insulin injections when you were ready. Your blood sugar levels were stable by the time you discharged. You were also given some mouthwash for the rash in your mouth.    POST-HOSPITAL & CARE INSTRUCTIONS Please let PCP/Specialists know of any changes that were made.  Please see medications section of this packet for any medication changes.   DOCTOR'S APPOINTMENT & FOLLOW UP CARE INSTRUCTIONS  Future Appointments  Date Time Provider Department Center  09/12/2023 11:00 AM [facility] 2 [facility]    RETURN PRECAUTIONS: - Feeling faint or weak  - Confusion - Vomiting nonstop  Take care and be well!  Family Medicine Teaching Service  [place]  [facility]  [address] [place], [place] [address] [phone]

## 2023-05-18 NOTE — TOC Initial Note (Signed)
 Transition of Care [facility]) - Initial/Assessment Note    Patient Details  Name: Susan Ingram MRN: [id number] Date of Birth: 12/10/1940  [facility] CM/SW Contact:    Kermit Balo, RN Phone Number: 05/18/2023, 2:02 PM  Clinical Narrative:                  Pt is from home with her daughter. Daughter is with her all the time.  Daughter manages the patients medications.  Granddaughter provides needed transportation. Granddaughter requesting CBG meter kit for home. CM has updated MD.  TOC following.  Expected Discharge Plan: Home/Self Care Barriers to Discharge: Continued Medical Work up   Patient Goals and CMS Choice            Expected Discharge Plan and Services   Discharge Planning Services: CM Consult   Living arrangements for the past 2 months: Apartment                                      Prior Living Arrangements/Services Living arrangements for the past 2 months: Apartment Lives with:: Adult Children Patient language and need for interpreter reviewed:: Yes Do you feel safe going back to the place where you live?: Yes        Care giver support system in place?: Yes (comment)   Criminal Activity/Legal Involvement Pertinent to Current Situation/Hospitalization: No - Comment as needed  Activities of Daily Living   ADL Screening (condition at time of admission) Independently performs ADLs?: Yes (appropriate for developmental age) Is the patient deaf or have difficulty hearing?: Yes Does the patient have difficulty seeing, even when wearing glasses/contacts?: No Does the patient have difficulty concentrating, remembering, or making decisions?: Yes  Permission Sought/Granted                  Emotional Assessment Appearance:: Appears stated age Attitude/Demeanor/Rapport: Engaged Affect (typically observed): Accepting Orientation: : Oriented to Self, Oriented to Place, Oriented to  Time, Oriented to Situation   Psych  Involvement: No (comment)  Admission diagnosis:  DKA (diabetic ketoacidosis) (HCC) [E11.10] Hyperglycemia [R73.9] AKI (acute kidney injury) (HCC) [N17.9] Patient Active Problem List   Diagnosis Date Noted   Chronic health problem 05/18/2023   DKA (diabetic ketoacidosis) (HCC) 05/17/2023   Oral thrush 05/17/2023   Hypercalcemia 05/17/2023   Bilateral primary osteoarthritis of knee 01/04/2023   Abnormal hearing screen, right ear 10/07/2022   GERD (gastroesophageal reflux disease) 09/28/2022   Hepatic fibrosis 09/02/2021   Autoimmune hepatitis (HCC) 07/28/2021   Hepatic steatosis 10/04/2020   Hypokalemia 04/10/2020   Dementia, mild 04/09/2019   Type 2 diabetes mellitus with complication, without long-term current use of insulin (HCC) 03/16/2019   Generalized anxiety disorder 05/11/2018   Major depressive disorder, recurrent episode (HCC) 02/11/2016   Allergic rhinitis 04/22/2015   Knee pain, bilateral 04/09/2014   Hypertension 02/18/2012   Asthma 02/18/2012   Hyperlipidemia    PCP:  [name], DO Pharmacy:   [id number] - [place] ([place]), [place] - [address] [place] ([place]) [place] [address] Phone: [phone] Fax: [phone]     Social Drivers of Health (SDOH) Social History: SDOH Screenings   Food Insecurity: No Food Insecurity (05/17/2023)  Housing: Low Risk  (05/17/2023)  Transportation Needs: No Transportation Needs (05/17/2023)  Utilities: Not At Risk (05/17/2023)  Alcohol Screen: Low Risk  (11/10/2022)  Depression (PHQ2-9): Low Risk  (  04/12/2023)  Recent Concern: Depression (PHQ2-9) - Medium Risk (03/18/2023)  Financial Resource Strain: Low Risk  (11/10/2022)  Physical Activity: Insufficiently Active (11/10/2022)  Social Connections: Socially Isolated (05/17/2023)  Stress: No Stress Concern Present (11/10/2022)  Tobacco Use: Medium Risk (05/17/2023)  Health Literacy: Adequate Health Literacy (11/10/2022)   SDOH Interventions:      Readmission Risk Interventions     No data to display

## 2023-05-19 ENCOUNTER — Other Ambulatory Visit (HOSPITAL_COMMUNITY): Payer: Self-pay

## 2023-05-19 DIAGNOSIS — E1110 Type 2 diabetes mellitus with ketoacidosis without coma: Secondary | ICD-10-CM | POA: Diagnosis not present

## 2023-05-19 LAB — BASIC METABOLIC PANEL WITH GFR
Anion gap: 12 (ref 5–15)
Anion gap: 12 (ref 5–15)
BUN: 12 mg/dL (ref 8–23)
BUN: 12 mg/dL (ref 8–23)
CO2: 25 mmol/L (ref 22–32)
CO2: 26 mmol/L (ref 22–32)
Calcium: 9.3 mg/dL (ref 8.9–10.3)
Calcium: 9.8 mg/dL (ref 8.9–10.3)
Chloride: 94 mmol/L — ABNORMAL LOW (ref 98–111)
Chloride: 93 mmol/L — ABNORMAL LOW (ref 98–111)
Creatinine, Ser: 0.92 mg/dL (ref 0.44–1.00)
Creatinine, Ser: 1.09 mg/dL — ABNORMAL HIGH (ref 0.44–1.00)
GFR, Estimated: >60 mL/min (ref >60)
GFR, Estimated: 50 mL/min — ABNORMAL LOW (ref >60)
Glucose, Bld: 301 mg/dL — ABNORMAL HIGH (ref 70–99)
Glucose, Bld: 363 mg/dL — ABNORMAL HIGH (ref 70–99)
Potassium: 4.1 mmol/L (ref 3.5–5.1)
Potassium: 3.7 mmol/L (ref 3.5–5.1)
Sodium: 131 mmol/L — ABNORMAL LOW (ref 135–145)
Sodium: 131 mmol/L — ABNORMAL LOW (ref 135–145)

## 2023-05-19 LAB — GLUCOSE, CAPILLARY
Glucose-Capillary: 298 mg/dL — ABNORMAL HIGH (ref 70–99)
Glucose-Capillary: 515 mg/dL (ref 70–99)
Glucose-Capillary: 388 mg/dL — ABNORMAL HIGH (ref 70–99)
Glucose-Capillary: 413 mg/dL — ABNORMAL HIGH (ref 70–99)
Glucose-Capillary: 360 mg/dL — ABNORMAL HIGH (ref 70–99)
Glucose-Capillary: 273 mg/dL — ABNORMAL HIGH (ref 70–99)

## 2023-05-19 MED ORDER — LANCET DEVICE MISC
1.0000 | Freq: Three times a day (TID) | 0 refills | Status: AC
  Filled 2023-05-19: qty 1, fill #0

## 2023-05-19 MED ORDER — NYSTATIN 100000 UNIT/ML MT SUSP
5.0000 mL | Freq: Four times a day (QID) | OROMUCOSAL | 0 refills | Status: AC
  Filled 2023-05-19: qty 60, 3d supply, fill #0

## 2023-05-19 MED ORDER — INSULIN LISPRO (1 UNIT DIAL) 100 UNIT/ML (KWIKPEN)
5.0000 [IU] | PEN_INJECTOR | Freq: Three times a day (TID) | SUBCUTANEOUS | 0 refills | Status: DC
  Filled 2023-05-19: qty 15, 100d supply, fill #0

## 2023-05-19 MED ORDER — INSULIN GLARGINE 100 UNIT/ML SOLOSTAR PEN
20.0000 [IU] | PEN_INJECTOR | Freq: Every day | SUBCUTANEOUS | 0 refills | Status: DC
Start: 2023-05-19 — End: 2023-05-20
  Filled 2023-05-19: qty 15, 75d supply, fill #0

## 2023-05-19 MED ORDER — BLOOD GLUCOSE MONITOR SYSTEM W/DEVICE KIT
1.0000 | PACK | Freq: Three times a day (TID) | 0 refills | Status: AC
Start: 2023-05-19 — End: ?
  Filled 2023-05-19: qty 1, 30d supply, fill #0

## 2023-05-19 MED ORDER — BLOOD GLUCOSE TEST VI STRP
1.0000 | ORAL_STRIP | Freq: Three times a day (TID) | 0 refills | Status: DC
  Filled 2023-05-19: qty 100, 34d supply, fill #0

## 2023-05-19 MED ORDER — ACCU-CHEK SOFTCLIX LANCETS MISC
1.0000 | Freq: Three times a day (TID) | 0 refills | Status: DC
  Filled 2023-05-19: qty 100, 30d supply, fill #0

## 2023-05-19 MED ORDER — INSULIN GLARGINE-YFGN 100 UNIT/ML ~~LOC~~ SOLN
30.0000 [IU] | Freq: Every day | SUBCUTANEOUS | Status: DC
Start: 2023-05-20 — End: 2023-05-20
  Filled 2023-05-19: qty 0.3

## 2023-05-19 MED ORDER — INSULIN GLARGINE-YFGN 100 UNIT/ML ~~LOC~~ SOLN
10.0000 [IU] | Freq: Once | SUBCUTANEOUS | Status: AC
  Administered 2023-05-19: 10 [IU] via SUBCUTANEOUS
  Filled 2023-05-19: qty 0.1

## 2023-05-19 MED ORDER — INSULIN PEN NEEDLE 32G X 4 MM MISC
1.0000 | Freq: Three times a day (TID) | 0 refills | Status: DC
Start: 2023-05-19 — End: 2023-07-04
  Filled 2023-05-19: qty 100, 30d supply, fill #0

## 2023-05-19 NOTE — Inpatient Diabetes Management (Signed)
 Inpatient Diabetes Program Recommendations  AACE/ADA: New Consensus Statement on Inpatient Glycemic Control (2015)  Target Ranges:  Prepandial:   less than 140 mg/dL      Peak postprandial:   less than 180 mg/dL (1-2 hours)      Critically ill patients:  140 - 180 mg/dL   Lab Results  Component Value Date   GLUCAP 298 (H) 05/19/2023   HGBA1C 13.4 (H) 05/17/2023    Review of Glycemic Control  Diabetes history: DM2 Outpatient Diabetes medications: Farxiga 5 mg daily,  Current orders for Inpatient glycemic control: Semglee 20 units daily, Novolog 0-9 TID with meals and 0-5   HgbA1C - 13.4%  Inpatient Diabetes Program Recommendations:    For home: See Diabetes Discharge Order Set  Educated patient on insulin pen use at home. Reviewed contents of insulin flexpen starter kit. Reviewed all steps if insulin pen including attachment of needle, 2-unit air shot, dialing up dose, giving injection, removing needle, disposal of sharps, storage of unused insulin, disposal of insulin etc. Patient's daughter able to provide successful return demonstration. Also reviewed troubleshooting with insulin pen. MD to give patient Rxs for insulin pens and insulin pen needles.  Discussed hypoglycemia s/s and treatment. Answered questions regarding diet, monitoring. Pt to f/u with PCP with blood sugar log.  Thank you. Ailene Ards, RD, LDN, CDCES Inpatient Diabetes Coordinator [phone]

## 2023-05-19 NOTE — Plan of Care (Signed)

## 2023-05-19 NOTE — Assessment & Plan Note (Signed)
 Hypertension: Continue to hold chlorthalidone due to normotension Hyperlipidemia: Continue atorvastatin daily Mood/sleep disturbance: Continue home mirtazapine

## 2023-05-19 NOTE — Assessment & Plan Note (Signed)
 Resolved, normal at 4.1 today

## 2023-05-19 NOTE — Assessment & Plan Note (Deleted)
 HTN: normotensive, holding home chlorthalidone HLD: continue home atorvastatin Mood/poor appetite: continue home remeron

## 2023-05-19 NOTE — Assessment & Plan Note (Deleted)
 BG 300s overnight. Continuing to titrate subcutaneous insulin -Increased to 20 units semglee daily -continue sSSI -continue bedtime correction insulin -continue CBG QID with meals and at bedtime -continue to monitor electrolytes with daily BMPs, repletion as needed

## 2023-05-19 NOTE — Assessment & Plan Note (Signed)
 Blood sugars in the 300s overnight and this morning.  Electrolytes WNL -- Increase to 20 units Semglee with sSSI -- CBG 4 times daily with meals and at bedtime

## 2023-05-19 NOTE — Assessment & Plan Note (Deleted)
 Normalized to ~10. PTH 9, likely secondary to ***.

## 2023-05-19 NOTE — Assessment & Plan Note (Signed)
 Calcium normalized with appropriately suppressed PTH

## 2023-05-19 NOTE — Progress Notes (Signed)
     Daily Progress Note Intern Pager: [phone]  Patient name: Susan Ingram Medical record number: [id number] Date of birth: September 02, 1940 Age: 83 y.o. Gender: female  Primary Care Provider: [name], DO Consultants: None Code Status: Full  Pt Overview and Major Events to Date:  4/1 admitted to FMTS  Assessment and Plan: Susan Ingram is an 83 year old female with a history of autoimmune hepatitis, DM 2, hypertension, hyperlipidemia admitted for DKA.  Continues to do well today however blood sugars remain elevated.  Met with diabetes educator today.  Will continue to titrate insulin and plan for discharge later today versus tomorrow.  Assessment & Plan DKA (diabetic ketoacidosis) (HCC) Blood sugars in the 300s overnight and this morning.  Electrolytes WNL -- Increase to 20 units Semglee with sSSI -- CBG 4 times daily with meals and at bedtime  Oral thrush Much improved.  Continue oral nystatin  Hypercalcemia Calcium normalized with appropriately suppressed PTH   Chronic health problem Hypertension: Continue to hold chlorthalidone due to normotension Hyperlipidemia: Continue atorvastatin daily Mood/sleep disturbance: Continue home mirtazapine  Hypokalemia Resolved, normal at 4.1 today   FEN/GI: Carb modified PPx: Lovenox Dispo: Home with home health PT pending stabilization of blood glucose  Subjective:  Patient lying in bed with daughter at bedside.  In good spirits, eager to go home.  Daughter has diabetes and is comfortable helping the patient with her insulin once she is discharged  Objective: Temp:  [98 F (36.7 C)-98.5 F (36.9 C)] 98.4 F (36.9 C) (04/03 0800) Pulse Rate:  [62-94] 62 (04/03 0319) Resp:  [16-17] 16 (04/02 1946) BP: (126-142)/(62-84) 142/77 (04/03 0319) SpO2:  [98 %-100 %] 100 % (04/03 0800) Physical Exam: General: Resting in bed, AAOx4, pleasant and conversant, in no acute distress Cardiovascular: RRR, normal S1/S2, no murmurs,  rubs or gallops Respiratory: CTAB, normal WOB on room air Extremities: No edema to BLE  Laboratory: Most recent CBC Lab Results  Component Value Date   WBC 11.0 (H) 05/18/2023   HGB 13.1 05/18/2023   HCT 38.0 05/18/2023   MCV 81.0 05/18/2023   PLT 255 05/18/2023   Most recent BMP    Latest Ref Rng & Units 05/19/2023    6:49 AM  BMP  Glucose 70 - 99 mg/dL 147   BUN 8 - 23 mg/dL 12   Creatinine 8.29 - 1.00 mg/dL 5.62   Sodium 130 - 865 mmol/L 131   Potassium 3.5 - 5.1 mmol/L 4.1   Chloride 98 - 111 mmol/L 94   CO2 22 - 32 mmol/L 25   Calcium 8.9 - 10.3 mg/dL 9.3     Other pertinent labs none  Imaging/Diagnostic Tests: No new imaging or tests  Susan Bender, MD 05/19/2023, 1:40 PM  PGY-1, [place] [facility] FPTS Intern pager: [phone], text pages welcome Secure chat group [facility] Teaching Service

## 2023-05-19 NOTE — Discharge Summary (Incomplete Revision)
 Family Medicine Teaching [facility] Discharge Summary  Patient name: Susan Ingram Medical record number: [id number] Date of birth: 1940/09/24 Age: 83 y.o. Gender: female Date of Admission: 05/17/2023  Date of Discharge: 4/***/25 Admitting Physician: Penne Lash, MD  Primary Care Provider: [name], DO Consultants: diabetes education  Indication for Hospitalization: DKA  Discharge Diagnoses/Problem List:  Principal Problem for Admission: DKA Other Problems addressed during stay:  Principal Problem:   DKA (diabetic ketoacidosis) (HCC) Active Problems:   Hypokalemia   Oral thrush   Hypercalcemia   Chronic health problem    Brief Hospital Course:  Susan Ingram is a 83 y.o. female who was admitted to the [facility] Medicine Teaching Service at [facility] for DKA. Hospital course is outlined below by problem.   DKA Patient sent from PCP due to hyperglycemia with several weeks of fatigue, anorexia, polyuria and polydipsia.  Per patient's daughter patient had poor p.o. intake and was lying in bed all day.  Daughter reported good medication adherence.  On presentation to the ED, patient was found to have blood glucose in the 500s with anion gap 20 and ketones on UA.  Endo tool started and patient was transitioned to subcutaneous insulin with D5 LR once anion gap closed x 2 and glucose was less than 200.  Potassium was closely monitored and normalized by the time of discharge. Diabetes educator saw patient while hospitalized.  Oral thrush Present on admission.  Treated with oral nystatin.  Resolved by day of discharge.  Hypercalcemia Calcium of 10.5 on admission. PTH appropriately suppressed. Normalized by time of discharge.   Other conditions that were chronic and stable: Hypertension (chlorthalidone held due to normotension), hyperlipidemia (atorvastatin), mild cognitive impairment (mirtazapine)  Issues for follow up: Check BP, consider restarting chlorthalidone if  hypertensive Titrate insulin as needed D/c'd farxiga due to high A1c     Disposition: home with HHPT  Discharge Condition: stable  Discharge Exam:  Vitals:   05/19/23 0319 05/19/23 0800  BP: (!) 142/77   Pulse: 62   Resp:    Temp: 98.5 F (36.9 C) 98.4 F (36.9 C)  SpO2: 100% 100%   *** Gen: sitting up in bed, pleasant and conversant, in NAD CV: RRR, normal S1/S2, no murmurs, rubs, or gallops Pulm: CTAB, normal WOB on RA Ext: no edema to BLE  Significant Procedures: none  Significant Labs and Imaging:  Recent Labs  Lab 05/18/23 0226  WBC 11.0*  HGB 13.1  HCT 38.0  PLT 255   Recent Labs  Lab 05/17/23 2201 05/18/23 0226 05/18/23 0824 05/18/23 1459 05/19/23 0649  NA 134* 130* 134* 132* 131*  K 3.1* 2.9* 3.0* 4.2 4.1  CL 92* 91* 93* 94* 94*  CO2 31 30 29 26 25   GLUCOSE 223* 190* 136* 270* 301*  BUN 28* 24* 19 17 12   CREATININE 1.13* 1.06* 0.95 1.06* 0.92  CALCIUM 10.2 9.8 9.9 10.3 9.3    CXR 4/1: no active cardiopulmonary disease  Results/Tests Pending at Time of Discharge: none  Discharge Medications:  Allergies as of 05/19/2023       Reactions   Amlodipine Swelling   LEG AND ANKLE   Dexamethasone Other (See Comments)   Unsure   Maxzide [triamterene-hctz] Other (See Comments)   Unsure of reaction   Toprol Xl [metoprolol Tartrate] Other (See Comments)        Medication List     STOP taking these medications    chlorthalidone 25 MG tablet Commonly known as: HYGROTON  Farxiga 5 MG Tabs tablet Generic drug: dapagliflozin propanediol       TAKE these medications    Accu-Chek Guide Test test strip Generic drug: glucose blood Use as directed 3 (three) times daily. Use as directed to check blood sugar. May dispense any manufacturer covered by patient's insurance and fits patient's device.   Accu-Chek Guide w/Device Kit Use as directed 3 (three) times daily. May dispense any manufacturer covered by patient's insurance.   Accu-Chek  Softclix Lancets lancets Use as directed 3 (three) times daily. Use as directed to check blood sugar. May dispense any manufacturer covered by patient's insurance and fits patient's device.   albuterol 108 (90 Base) MCG/ACT inhaler Commonly known as: VENTOLIN HFA Inhale 2 puffs into the lungs every 6 (six) hours as needed for wheezing or shortness of breath.   atorvastatin 40 MG tablet Commonly known as: LIPITOR Take 1 tablet (40 mg total) by mouth daily.   azelastine 0.1 % nasal spray Commonly known as: ASTELIN Place 2 sprays into both nostrils 2 (two) times daily. Use in each nostril as directed   budesonide-formoterol 80-4.5 MCG/ACT inhaler Commonly known as: SYMBICORT Inhale 2 puffs into the lungs 2 (two) times daily.   cetirizine 10 MG tablet Commonly known as: ZYRTEC Take 0.5 tablets (5 mg total) by mouth daily.   diclofenac Sodium 1 % Gel Commonly known as: Voltaren Apply 2 g topically 4 (four) times daily.   famotidine 40 MG tablet Commonly known as: PEPCID Take 1 tablet (40 mg total) by mouth daily.   fluticasone 50 MCG/ACT nasal spray Commonly known as: FLONASE Place 1 spray into both nostrils daily.   HumaLOG KwikPen 100 UNIT/ML KwikPen Generic drug: insulin lispro Inject 5 Units into the skin with breakfast, with lunch, and with evening meal. Only take if eating a meal AND Blood Glucose (BG) is 80 or higher.   Lancet Device Misc Use as directed 3 (three) times daily. May dispense any manufacturer covered by patient's insurance.   Lantus SoloStar 100 UNIT/ML Solostar Pen Generic drug: insulin glargine Inject 20 Units into the skin daily. May substitute as needed per insurance.   mirtazapine 7.5 MG tablet Commonly known as: REMERON Take 1 tablet (7.5 mg total) by mouth at bedtime.   nystatin 100000 UNIT/ML suspension Commonly known as: MYCOSTATIN Take 5 mLs (500,000 Units total) by mouth 4 (four) times daily for 5 days.   TechLite Plus Pen Needles 32G X  4 MM Misc Generic drug: Insulin Pen Needle Use as directed 3 (three) times daily. May dispense any manufacturer covered by patient's insurance.               Durable Medical Equipment  (From admission, onward)           Start     Ordered   05/19/23 1311  For home use only DME Walker rolling  Once       Question Answer Comment  Walker: With 5 Inch Wheels   Patient needs a walker to treat with the following condition Gait difficulty      05/19/23 1310            Discharge Instructions: Please refer to Patient Instructions section of EMR for full details.  Patient was counseled important signs and symptoms that should prompt return to medical care, changes in medications, dietary instructions, activity restrictions, and follow up appointments.   Follow-Up Appointments:   Lorayne Bender, MD 05/19/2023, 1:17 PM PGY-1, [facility] Family Medicine

## 2023-05-19 NOTE — Assessment & Plan Note (Deleted)
 Normalized on 4/2, remains stable at 4.1. - will monitor with daily BMP, replete as needed

## 2023-05-19 NOTE — Assessment & Plan Note (Signed)
 Much improved.  Continue oral nystatin

## 2023-05-19 NOTE — Discharge Summary (Cosign Needed Addendum)
 Family Medicine Teaching [facility] Discharge Summary  Patient name: Susan Ingram Medical record number: [id number] Date of birth: 1940-09-05 Age: 83 y.o. Gender: female Date of Admission: 05/17/2023  Date of Discharge: 05/20/23 Admitting Physician: Penne Lash, MD  Primary Care Provider: [name], DO Consultants: diabetes education  Indication for Hospitalization: DKA  Discharge Diagnoses/Problem List:  Principal Problem for Admission: DKA Other Problems addressed during stay:  Principal Problem:   DKA (diabetic ketoacidosis) (HCC) Active Problems:   Oral thrush   Chronic health problem    Brief Hospital Course:  Susan Ingram is a 83 y.o. female who was admitted to the [facility] Medicine Teaching Service at [facility] for DKA. Hospital course is outlined below by problem.   DKA Patient sent from PCP due to hyperglycemia with several weeks of fatigue, anorexia, polyuria and polydipsia.  Per patient's daughter patient had poor p.o. intake and was lying in bed all day.  Daughter reported good medication adherence.  On presentation to the ED, patient was found to have blood glucose in the 500s with anion gap 20 and ketones on UA.  Endo tool started and patient was transitioned to subcutaneous insulin with D5 LR once anion gap closed x 2 and glucose was less than 200.  Potassium was closely monitored and normalized by the time of discharge. Diabetes educator saw patient while hospitalized. Patient was discharged on the following medication regimen: 35 units lantus daily, 5 units lispro TIDM.  Oral thrush Present on admission.  Treated with oral nystatin.  Resolved by day of discharge.  Hypercalcemia Calcium of 10.5 on admission. PTH appropriately suppressed. Normalized by time of discharge.   Other conditions that were chronic and stable: Hypertension (chlorthalidone held due to normotension), hyperlipidemia (atorvastatin), mild cognitive impairment  (mirtazapine)  Issues for follow up: Check BP, consider restarting chlorthalidone if hypertensive Titrate insulin as needed D/c'd farxiga due to high A1c     Disposition: home with HHPT  Discharge Condition: stable  Discharge Exam:  Vitals:   05/20/23 0426 05/20/23 0725  BP: 127/66 122/81  Pulse: (!) 55 (!) 52  Resp:  16  Temp: 98.3 F (36.8 C) 98 F (36.7 C)  SpO2: 100% 100%   Gen: sitting up in bed, pleasant and conversant, in NAD CV: RRR, normal S1/S2, no murmurs, rubs, or gallops Pulm: CTAB, normal WOB on RA Ext: no edema to BLE  Significant Procedures: none  Significant Labs and Imaging:  No results for input(s): "WBC", "HGB", "HCT", "PLT" in the last 48 hours.  Recent Labs  Lab 05/18/23 1459 05/19/23 0649 05/19/23 1517 05/20/23 0648  NA 132* 131* 131* 136  K 4.2 4.1 3.7 3.5  CL 94* 94* 93* 100  CO2 26 25 26 26   GLUCOSE 270* 301* 363* 247*  BUN 17 12 12 10   CREATININE 1.06* 0.92 1.09* 0.93  CALCIUM 10.3 9.3 9.8 9.4    CXR 4/1: no active cardiopulmonary disease  Results/Tests Pending at Time of Discharge: none  Discharge Medications:  Allergies as of 05/20/2023       Reactions   Amlodipine Swelling   LEG AND ANKLE   Dexamethasone Other (See Comments)   Unsure   Maxzide [triamterene-hctz] Other (See Comments)   Unsure of reaction   Toprol Xl [metoprolol Tartrate] Other (See Comments)        Medication List     STOP taking these medications    chlorthalidone 25 MG tablet Commonly known as: HYGROTON   Farxiga 5 MG Tabs tablet  Generic drug: dapagliflozin propanediol       TAKE these medications    Accu-Chek Guide Test test strip Generic drug: glucose blood Use as directed 3 (three) times daily. Use as directed to check blood sugar. May dispense any manufacturer covered by patient's insurance and fits patient's device.   Accu-Chek Guide w/Device Kit Use as directed 3 (three) times daily. May dispense any manufacturer covered by  patient's insurance.   Accu-Chek Softclix Lancets lancets Use as directed 3 (three) times daily. Use as directed to check blood sugar. May dispense any manufacturer covered by patient's insurance and fits patient's device.   albuterol 108 (90 Base) MCG/ACT inhaler Commonly known as: VENTOLIN HFA Inhale 2 puffs into the lungs every 6 (six) hours as needed for wheezing or shortness of breath.   atorvastatin 40 MG tablet Commonly known as: LIPITOR Take 1 tablet (40 mg total) by mouth daily.   azelastine 0.1 % nasal spray Commonly known as: ASTELIN Place 2 sprays into both nostrils 2 (two) times daily. Use in each nostril as directed   budesonide-formoterol 80-4.5 MCG/ACT inhaler Commonly known as: SYMBICORT Inhale 2 puffs into the lungs 2 (two) times daily.   cetirizine 10 MG tablet Commonly known as: ZYRTEC Take 0.5 tablets (5 mg total) by mouth daily.   diclofenac Sodium 1 % Gel Commonly known as: Voltaren Apply 2 g topically 4 (four) times daily.   famotidine 40 MG tablet Commonly known as: PEPCID Take 1 tablet (40 mg total) by mouth daily.   fluticasone 50 MCG/ACT nasal spray Commonly known as: FLONASE Place 1 spray into both nostrils daily.   HumaLOG KwikPen 100 UNIT/ML KwikPen Generic drug: insulin lispro Inject 5 Units into the skin with breakfast, with lunch, and with evening meal. Only take if eating a meal AND Blood Glucose (BG) is 80 or higher.   insulin glargine 100 UNIT/ML Solostar Pen Commonly known as: LANTUS Inject 35 Units into the skin daily. May substitute as needed per insurance.   Lancet Device Misc Use as directed 3 (three) times daily. May dispense any manufacturer covered by patient's insurance.   mirtazapine 7.5 MG tablet Commonly known as: REMERON Take 1 tablet (7.5 mg total) by mouth at bedtime.   nystatin 100000 UNIT/ML suspension Commonly known as: MYCOSTATIN Take 5 mLs (500,000 Units total) by mouth 4 (four) times daily for 5 days.    TechLite Plus Pen Needles 32G X 4 MM Misc Generic drug: Insulin Pen Needle Use as directed 3 (three) times daily. May dispense any manufacturer covered by patient's insurance.               Durable Medical Equipment  (From admission, onward)           Start     Ordered   05/19/23 1311  For home use only DME Walker rolling  Once       Question Answer Comment  Walker: With 5 Inch Wheels   Patient needs a walker to treat with the following condition Gait difficulty      05/19/23 1310            Discharge Instructions: Please refer to Patient Instructions section of EMR for full details.  Patient was counseled important signs and symptoms that should prompt return to medical care, changes in medications, dietary instructions, activity restrictions, and follow up appointments.   Follow-Up Appointments:  Future Appointments  Date Time Provider Department Center  05/23/2023  3:30 PM [name], Drucilla Schmidt [facility]  09/12/2023  11:00 AM GI-[facility]G MM 2 GI-[facility] GI-BREAST CE     Lorayne Bender, MD 05/20/2023, 12:25 PM PGY-1, [facility] Family Medicine

## 2023-05-19 NOTE — Assessment & Plan Note (Deleted)
 Improved. Continue oral nystatin suspension

## 2023-05-20 ENCOUNTER — Other Ambulatory Visit (HOSPITAL_COMMUNITY): Payer: Self-pay

## 2023-05-20 DIAGNOSIS — E1110 Type 2 diabetes mellitus with ketoacidosis without coma: Secondary | ICD-10-CM | POA: Diagnosis not present

## 2023-05-20 LAB — BASIC METABOLIC PANEL WITH GFR
Anion gap: 10 (ref 5–15)
BUN: 10 mg/dL (ref 8–23)
CO2: 26 mmol/L (ref 22–32)
Calcium: 9.4 mg/dL (ref 8.9–10.3)
Chloride: 100 mmol/L (ref 98–111)
Creatinine, Ser: 0.93 mg/dL (ref 0.44–1.00)
GFR, Estimated: >60 mL/min (ref >60)
Glucose, Bld: 247 mg/dL — ABNORMAL HIGH (ref 70–99)
Potassium: 3.5 mmol/L (ref 3.5–5.1)
Sodium: 136 mmol/L (ref 135–145)

## 2023-05-20 LAB — GLUCOSE, CAPILLARY
Glucose-Capillary: 251 mg/dL — ABNORMAL HIGH (ref 70–99)
Glucose-Capillary: 369 mg/dL — ABNORMAL HIGH (ref 70–99)

## 2023-05-20 MED ORDER — INSULIN GLARGINE 100 UNIT/ML SOLOSTAR PEN
35.0000 [IU] | PEN_INJECTOR | Freq: Every day | SUBCUTANEOUS | 0 refills | Status: DC
  Filled 2023-05-20: qty 15, 42d supply, fill #0

## 2023-05-20 MED ORDER — INSULIN GLARGINE-YFGN 100 UNIT/ML ~~LOC~~ SOLN
35.0000 [IU] | Freq: Every day | SUBCUTANEOUS | Status: DC
  Administered 2023-05-20: 35 [IU] via SUBCUTANEOUS
  Filled 2023-05-20: qty 0.35

## 2023-05-20 NOTE — Assessment & Plan Note (Signed)
 Hypertension: Continue to hold home chlorthalidone due to normotension Hyperlipidemia: Continue atorvastatin daily Mood/Sleep disturbance: Continue home mirtazapine

## 2023-05-20 NOTE — Plan of Care (Signed)

## 2023-05-20 NOTE — Progress Notes (Signed)
   05/20/23 1144  AVS Discharge Documentation  AVS Discharge Instructions Including Medications Provided to patient/caregiver  Name of Person Receiving AVS Discharge Instructions Including Medications Susan Ingram  Name of Clinician That Reviewed AVS Discharge Instructions Including Medications Rubin Payor RN   AVS instructions were reviewed with patient and daughter at bedside. All questions were answered all personal belongings were returned. Patient refused the option of the DC lounge. Charge RN informed.

## 2023-05-20 NOTE — Progress Notes (Signed)
     Daily Progress Note Intern Pager: [phone]  Patient name: Susan Ingram Medical record number: [id number] Date of birth: 1941-02-01 Age: 83 y.o. Gender: female  Primary Care Provider: [name], DO Consultants: Diabetes education Code Status: Full  Pt Overview and Major Events to Date:  4/1: Admitted to FMTS for DKA  Assessment and Plan: Mr. Saintclair Halsted is an 83 year old female with a history of autoimmune hepatitis, type 2 diabetes, hypertension, and hyperlipidemia admitted for DKA.  She is stable on insulin and should be able to discharge home today.  Assessment & Plan DKA (diabetic ketoacidosis) (HCC) Blood sugars in the 300s yesterday afternoon and overnight.  Electrolytes WNL.  Met with diabetes educator yesterday, daughter feels comfortable managing insulin at home. -Increase to 35 units Semglee with sSSI -CBG 4 times daily with meals and at bedtime  Oral thrush Stable and much improved.  Continue oral nystatin  Chronic health problem Hypertension: Continue to hold home chlorthalidone due to normotension Hyperlipidemia: Continue atorvastatin daily Mood/Sleep disturbance: Continue home mirtazapine     FEN/GI: Carb modified PPx: Lovenox Dispo: Home with home health PT anticipate today  Subjective:  Patient is feeling well today.  Ready to go home.  Objective: Temp:  [97.7 F (36.5 C)-98.6 F (37 C)] 98 F (36.7 C) (04/04 0725) Pulse Rate:  [52-98] 52 (04/04 0725) Resp:  [16-20] 16 (04/04 0725) BP: (122-138)/(66-81) 122/81 (04/04 0725) SpO2:  [100 %] 100 % (04/04 0725) Physical Exam: General: Sitting up in bed eating breakfast, pleasant and conversant in no acute distress Cardiovascular: RRR, normal S1/2 Respiratory: CTAB, normal work of breathing on room air Extremities: No edema to BLE  Laboratory: Most recent CBC Lab Results  Component Value Date   WBC 11.0 (H) 05/18/2023   HGB 13.1 05/18/2023   HCT 38.0 05/18/2023   MCV 81.0 05/18/2023    PLT 255 05/18/2023   Most recent BMP    Latest Ref Rng & Units 05/20/2023    6:48 AM  BMP  Glucose 70 - 99 mg/dL 308   BUN 8 - 23 mg/dL 10   Creatinine 6.57 - 1.00 mg/dL 8.46   Sodium 962 - 952 mmol/L 136   Potassium 3.5 - 5.1 mmol/L 3.5   Chloride 98 - 111 mmol/L 100   CO2 22 - 32 mmol/L 26   Calcium 8.9 - 10.3 mg/dL 9.4     Other pertinent labs none  Imaging/Diagnostic Tests: No new images or diagnostic tests  Lorayne Bender, MD 05/20/2023, 12:26 PM  PGY-1, [facility] FPTS Intern pager: [phone], text pages welcome Secure chat group [facility] Teaching Service

## 2023-05-20 NOTE — TOC Transition Note (Signed)
 Transition of Care [facility]) - Discharge Note   Patient Details  Name: Susan Ingram MRN: [id number] Date of Birth: 11/27/1940  Transition of [facility]) CM/SW Contact:  Kermit Balo, RN Phone Number: 05/20/2023, 11:09 AM   Clinical Narrative:     Pt is discharging home with self care. Daughter will assist with new insulin at home.  Family to provide transportation home.   Final next level of care: Home/Self Care Barriers to Discharge: No Barriers Identified   Patient Goals and CMS Choice            Discharge Placement                       Discharge Plan and Services Additional resources added to the After Visit Summary for     Discharge Planning Services: CM Consult                                 Social Drivers of Health (SDOH) Interventions SDOH Screenings   Food Insecurity: No Food Insecurity (05/17/2023)  Housing: Low Risk  (05/17/2023)  Transportation Needs: No Transportation Needs (05/17/2023)  Utilities: Not At Risk (05/17/2023)  Alcohol Screen: Low Risk  (11/10/2022)  Depression (PHQ2-9): Low Risk  (04/12/2023)  Recent Concern: Depression (PHQ2-9) - Medium Risk (03/18/2023)  Financial Resource Strain: Low Risk  (11/10/2022)  Physical Activity: Insufficiently Active (11/10/2022)  Social Connections: Socially Isolated (05/17/2023)  Stress: No Stress Concern Present (11/10/2022)  Tobacco Use: Medium Risk (05/17/2023)  Health Literacy: Adequate Health Literacy (11/10/2022)     Readmission Risk Interventions     No data to display

## 2023-05-20 NOTE — Assessment & Plan Note (Signed)
 Stable and much improved.  Continue oral nystatin

## 2023-05-20 NOTE — Care Management Important Message (Signed)
 Important Message  Patient Details  Name: Susan Ingram MRN: [id number] Date of Birth: August 13, 1940   Important Message Given:  Yes - Medicare IM     Dorena Bodo 05/20/2023, 2:37 PM

## 2023-05-20 NOTE — Assessment & Plan Note (Signed)
 Blood sugars in the 300s yesterday afternoon and overnight.  Electrolytes WNL.  Met with diabetes educator yesterday, daughter feels comfortable managing insulin at home. -Increase to 35 units Semglee with sSSI -CBG 4 times daily with meals and at bedtime

## 2023-05-23 ENCOUNTER — Telehealth: Payer: Self-pay

## 2023-05-23 ENCOUNTER — Other Ambulatory Visit: Payer: Self-pay | Admitting: Student

## 2023-05-23 ENCOUNTER — Ambulatory Visit (INDEPENDENT_AMBULATORY_CARE_PROVIDER_SITE_OTHER): Payer: Self-pay | Admitting: Student

## 2023-05-23 VITALS — BP 122/75 | HR 87 | Wt 165.4 lb

## 2023-05-23 DIAGNOSIS — E118 Type 2 diabetes mellitus with unspecified complications: Secondary | ICD-10-CM

## 2023-05-23 DIAGNOSIS — I10 Essential (primary) hypertension: Secondary | ICD-10-CM | POA: Diagnosis not present

## 2023-05-23 MED ORDER — ONETOUCH VERIO FLEX SYSTEM W/DEVICE KIT: 1.0000 | PACK | Freq: Three times a day (TID) | 0 refills | Status: AC

## 2023-05-23 MED ORDER — GLUCOSE BLOOD VI STRP: ORAL_STRIP | 12 refills | Status: DC

## 2023-05-23 MED ORDER — ONETOUCH DELICA LANCETS 33G MISC: 1.0000 | Freq: Three times a day (TID) | 12 refills | Status: AC

## 2023-05-23 NOTE — Transitions of Care (Post Inpatient/ED Visit) (Deleted)
   05/23/2023  Name: Denessa Cavan MRN: [id number] DOB: 1940/12/31  Today's TOC FU Call Status: Today's TOC FU Call Status:: Successful TOC FU Call Completed TOC FU Call Complete Date: 05/20/23 (Briefly spoke with patient who said her daughter is the better person to talk with but is still sleeping. Patient agreed to call back tomorrow, 05/24/23 at 11am) Patient's Name and Date of Birth confirmed.  Transition Care Management Follow-up Telephone Call Date of Discharge: 05/20/23 Discharge Facility: [facility]) Type of Discharge: Inpatient Admission Primary Inpatient Discharge Diagnosis:: DKA (diabetic ketoacidosis) How have you been since you were released from the hospital?: Better Any questions or concerns?: No     Hilbert Odor RN, CCM [facility]  VBCI-Population Health RN Care Manager [phone]

## 2023-05-23 NOTE — Assessment & Plan Note (Addendum)
 BP: 122/75 today. Well controlled. Goal of <140 SBP. Medication regimen: None.  Remains well-controlled despite discontinuation of chlorthalidone during hospitalization.

## 2023-05-23 NOTE — Transitions of Care (Post Inpatient/ED Visit) (Signed)
   05/23/2023  Name: Susan Ingram MRN: [id number] DOB: 1940/12/31  Today's TOC FU Call Status: Today's TOC FU Call Status:: Successful TOC FU Call Completed TOC FU Call Complete Date: 05/20/23 (Briefly spoke with patient who said her daughter is the better person to talk with but is still sleeping. Patient agreed to call back tomorrow, 05/24/23 at 11am) Patient's Name and Date of Birth confirmed.  Transition Care Management Follow-up Telephone Call Date of Discharge: 05/20/23 Discharge Facility: [facility]) Type of Discharge: Inpatient Admission Primary Inpatient Discharge Diagnosis:: DKA (diabetic ketoacidosis) How have you been since you were released from the hospital?: Better Any questions or concerns?: No     Hilbert Odor RN, CCM [facility]  VBCI-Population Health RN Care Manager [phone]

## 2023-05-23 NOTE — Patient Instructions (Signed)
 It was great to see you today! Thank you for choosing [facility] for your primary care.  Today we addressed: I have submitted a prescription for One Touch Verio flex device and kit with glucose strips.  Please let me know if you need further supplies regarding this, I was unable to find lancets that went strictly with this device.  I would like to see you back in 1 month to make sure everything is going well.  Please continue to track fasting CBGs.  If you haven't already, sign up for My Chart to have easy access to your labs results, and communication with your primary care physician.  Return in about 1 month (around 06/22/2023) for Diabetes follow-up. Please arrive 15 minutes before your appointment to ensure smooth check in process.  We appreciate your efforts in making this happen.  Thank you for allowing me to participate in your care, [name], DO 05/23/2023, 3:52 PM PGY-3, [facility] Family Medicine

## 2023-05-23 NOTE — Assessment & Plan Note (Signed)
 Improved control, fasting CBGs 190s to 210s with Lantus 35 units daily, Humalog 5 units 3 times daily with meals.  This is appropriate for now but may adjust dosing in the future should fastings be significantly changed.  Rx sent for One Touch Verio flex supplies as requested by patient's daughter.

## 2023-05-23 NOTE — Progress Notes (Signed)
  SUBJECTIVE:   CHIEF COMPLAINT / HPI:   Presents for hospital follow-up in which she was admitted for DKA. Fasting CBGs are 190s-210s.  Her daughter helps her with her insulin.  In note that the insulin supplies they have her not that great and requesting One Touch Verio flex supplies.  Patient states she is not as tired and she is not having increased thirst and urination as she was previously.  Issues for follow up: Check BP, consider restarting chlorthalidone if hypertensive Titrate insulin as needed D/c'd farxiga due to high A1c   PERTINENT  PMH / PSH: HTN, HLD, asthma, allergies, autoimmune hepatitis, GERD, T2DM, dementia, MDD   OBJECTIVE:  BP 122/75   Pulse 87   Wt 165 lb 6.4 oz (75 kg)   SpO2 98%   BMI 26.70 kg/m  General: Well-appearing, NAD CV: RRR, murmurs appreciable Pulm: Normal WOB  ASSESSMENT/PLAN:   Assessment & Plan Type 2 diabetes mellitus with complication, without long-term current use of insulin (HCC) Improved control, fasting CBGs 190s to 210s with Lantus 35 units daily, Humalog 5 units 3 times daily with meals.  This is appropriate for now but may adjust dosing in the future should fastings be significantly changed.  Rx sent for One Touch Verio flex supplies as requested by patient's daughter. Primary hypertension BP: 122/75 today. Well controlled. Goal of <140 SBP. Medication regimen: None.  Remains well-controlled despite discontinuation of chlorthalidone during hospitalization. Return in about 1 month (around 06/22/2023) for Diabetes follow-up.  [name], DO 05/23/2023, 4:47 PM PGY-3, [facility]

## 2023-05-23 NOTE — Telephone Encounter (Signed)
 [facility] calls nurse line requesting a prescription for lancets.   Will forward to PCP.

## 2023-05-24 ENCOUNTER — Telehealth: Payer: Self-pay

## 2023-05-24 NOTE — Transitions of Care (Post Inpatient/ED Visit) (Signed)
   05/24/2023  Name: Cerissa Zeiger MRN: [id number] DOB: 1940/10/20  Today's TOC FU Call Status: Today's TOC FU Call Status:: Successful TOC FU Call Completed TOC FU Call Complete Date:  (Spoke with patient/daughter - said they saw PCP yesterday and declined participation in [facility] 30 day program) Patient's Name and Date of Birth confirmed.  Transition Care Management Follow-up Telephone Call: [facility] RN unable to complete assessment. Daughter, Rinaldo Cloud states they saw PCP yesterday and states they went over everything and she feels hospital and PCP are doing a good job. Daughter accepted [facility] RN contact information and agreed to call if questions/concerns arise     Hilbert Odor RN, CCM [facility]  VBCI-Population Health RN Care Manager [phone]

## 2023-06-16 ENCOUNTER — Telehealth: Payer: Self-pay

## 2023-06-16 NOTE — Telephone Encounter (Signed)
 Patients daughter calls nurse line in regards to low blood sugars.   She reports over the last several days her fasting blood sugars have been 96-120s. She reports when she "eventually" eats her blood sugar "goes back up."   She reports she has not eaten anything since earlier this morning. She reports she just checked her blood sugar and 56.  She denies any hypoglycemic symptoms.   Daughter advised to make sure she has adequate nutrition and not skipping meals or going long periods without food.   Advised to give her a meal and to continue monitoring her blood sugars over night.   ED precautions discussed.   Advised to call the clinic for visit if blood sugars remain low.   Will forward to PCP.

## 2023-06-21 ENCOUNTER — Ambulatory Visit: Admitting: Student

## 2023-06-21 ENCOUNTER — Other Ambulatory Visit: Payer: Self-pay | Admitting: Nurse Practitioner

## 2023-06-21 DIAGNOSIS — K7400 Hepatic fibrosis, unspecified: Secondary | ICD-10-CM

## 2023-06-21 DIAGNOSIS — K754 Autoimmune hepatitis: Secondary | ICD-10-CM

## 2023-06-21 NOTE — Telephone Encounter (Signed)
 Spoke with patient.   She reports her fasting blood sugars "have been pretty good." She reports "all normal."  Patient advised to give us  a call between now and 5/12 PCP apt if fasting sugars dip low again.   She agreed with plan.

## 2023-06-26 ENCOUNTER — Other Ambulatory Visit: Payer: Self-pay

## 2023-06-26 ENCOUNTER — Emergency Department (HOSPITAL_BASED_OUTPATIENT_CLINIC_OR_DEPARTMENT_OTHER)
Admission: EM | Admit: 2023-06-26 | Discharge: 2023-06-26 | Disposition: A | Discharge status: Home / Self Care | Attending: Emergency Medicine | Admitting: Emergency Medicine

## 2023-06-26 ENCOUNTER — Emergency Department (HOSPITAL_BASED_OUTPATIENT_CLINIC_OR_DEPARTMENT_OTHER)

## 2023-06-26 DIAGNOSIS — S0990XA Unspecified injury of head, initial encounter: Secondary | ICD-10-CM | POA: Diagnosis not present

## 2023-06-26 DIAGNOSIS — F0390 Unspecified dementia without behavioral disturbance: Secondary | ICD-10-CM | POA: Insufficient documentation

## 2023-06-26 DIAGNOSIS — W19XXXA Unspecified fall, initial encounter: Secondary | ICD-10-CM

## 2023-06-26 DIAGNOSIS — Y92480 Sidewalk as the place of occurrence of the external cause: Secondary | ICD-10-CM | POA: Diagnosis not present

## 2023-06-26 DIAGNOSIS — W01198A Fall on same level from slipping, tripping and stumbling with subsequent striking against other object, initial encounter: Secondary | ICD-10-CM | POA: Insufficient documentation

## 2023-06-26 DIAGNOSIS — I6529 Occlusion and stenosis of unspecified carotid artery: Secondary | ICD-10-CM | POA: Diagnosis not present

## 2023-06-26 DIAGNOSIS — S199XXA Unspecified injury of neck, initial encounter: Secondary | ICD-10-CM | POA: Diagnosis not present

## 2023-06-26 NOTE — ED Triage Notes (Signed)
 Pt POV after mechanical fall, fell backwards and hit head on concrete, -LOC, no thinners, no bump or wound noted.

## 2023-06-26 NOTE — ED Notes (Signed)
 Patient transported to CT

## 2023-06-26 NOTE — ED Provider Notes (Signed)
 [place] EMERGENCY DEPARTMENT AT [facility] Provider Note   CSN: [id number] Arrival date & time: 06/26/23  1847     History  Chief Complaint  Patient presents with   Susan Ingram is a 83 y.o. female.  83 year old female with history of dementia presents emergency department after a fall.  Patient was attempting to break up a "Mele" when she fell backwards and struck her head on the sidewalk.  No LOC.  No confusion afterwards.  No vomiting.  Not on blood thinners.  No other injuries as a result of the fall.  Has been ambulatory since.  Additional history obtained by her granddaughter       Home Medications Prior to Admission medications   Medication Sig Start Date End Date Taking? Authorizing Provider  albuterol  (VENTOLIN  HFA) 108 (90 Base) MCG/ACT inhaler Inhale 2 puffs into the lungs every 6 (six) hours as needed for wheezing or shortness of breath. 03/22/23   [provider]  atorvastatin  (LIPITOR) 40 MG tablet Take 1 tablet (40 mg total) by mouth daily. 04/12/23   Dahbura, Anton, DO  azelastine  (ASTELIN ) 0.1 % nasal spray Place 2 sprays into both nostrils 2 (two) times daily. Use in each nostril as directed 03/22/23   Jonne Netters, MD  Blood Glucose Monitoring Suppl (BLOOD GLUCOSE MONITOR SYSTEM) w/Device KIT Use as directed 3 (three) times daily. May dispense any manufacturer covered by patient's insurance. 05/19/23   Dema Filler, MD  Blood Glucose Monitoring Suppl (ONETOUCH VERIO FLEX SYSTEM) w/Device KIT 1 Box by Does not apply route 3 (three) times daily. 05/23/23   Veronia Goon, DO  budesonide -formoterol  (SYMBICORT ) 80-4.5 MCG/ACT inhaler Inhale 2 puffs into the lungs 2 (two) times daily. 03/22/23   Jonne Netters, MD  cetirizine  (ZYRTEC ) 10 MG tablet Take 0.5 tablets (5 mg total) by mouth daily. 12/22/22   Veronia Goon, DO  diclofenac  Sodium (VOLTAREN ) 1 % GEL Apply 2 g topically 4 (four) times daily. 12/09/22   Everhart, Kirstie, DO   famotidine  (PEPCID ) 40 MG tablet Take 1 tablet (40 mg total) by mouth daily. 04/12/23   Veronia Goon, DO  fluticasone  (FLONASE ) 50 MCG/ACT nasal spray Place 1 spray into both nostrils daily. 03/22/23   Jonne Netters, MD  Glucose Blood (BLOOD GLUCOSE TEST STRIPS) STRP Use as directed 3 (three) times daily. Use as directed to check blood sugar. May dispense any manufacturer covered by patient's insurance and fits patient's device. 05/19/23   Dema Filler, MD  glucose blood test strip Use as instructed 05/23/23   Veronia Goon, DO  insulin  glargine (LANTUS ) 100 UNIT/ML Solostar Pen Inject 35 Units into the skin daily. May substitute as needed per insurance. 05/20/23   Ivin Marrow, MD  insulin  lispro (HUMALOG ) 100 UNIT/ML KwikPen Inject 5 Units into the skin with breakfast, with lunch, and with evening meal. Only take if eating a meal AND Blood Glucose (BG) is 80 or higher. 05/19/23   Dema Filler, MD  Insulin  Pen Needle 32G X 4 MM MISC Use as directed 3 (three) times daily. May dispense any manufacturer covered by patient's insurance. 05/19/23   Dema Filler, MD  Lancet Device MISC Use as directed 3 (three) times daily. May dispense any manufacturer covered by patient's insurance. 05/19/23   Dema Filler, MD  mirtazapine  (REMERON ) 7.5 MG tablet Take 1 tablet (7.5 mg total) by mouth at bedtime. 04/12/23   Veronia Goon, DO  OneTouch Delica Lancets 33G MISC 1 Lancet by Does not apply route  3 (three) times daily. 05/23/23   Veronia Goon, DO  potassium chloride  (K-DUR) 10 MEQ tablet Take 1 tablet (10 mEq total) by mouth daily. 02/18/12 10/03/12  Cook, Jayce G, DO      Allergies    Amlodipine, Dexamethasone, Maxzide [triamterene-hctz], and Toprol xl [metoprolol tartrate]    Review of Systems   Review of Systems  Physical Exam Updated Vital Signs BP (!) 161/93   Pulse 79   Temp 97.7 F (36.5 C)   Resp 16   Ht 5\' 6"  (1.676 m)   Wt 77.1 kg   SpO2 100%   BMI 27.44 kg/m  Physical Exam Constitutional:       General: She is not in acute distress.    Appearance: Normal appearance. She is not ill-appearing.  HENT:     Head: Normocephalic and atraumatic.     Right Ear: External ear normal.     Left Ear: External ear normal.     Mouth/Throat:     Mouth: Mucous membranes are moist.     Pharynx: Oropharynx is clear.  Eyes:     Extraocular Movements: Extraocular movements intact.     Conjunctiva/sclera: Conjunctivae normal.     Pupils: Pupils are equal, round, and reactive to light.  Neck:     Comments: No C-spine midline tenderness to palpation Pulmonary:     Effort: Pulmonary effort is normal. No respiratory distress.  Abdominal:     General: Abdomen is flat. There is no distension.     Palpations: Abdomen is soft. There is no mass.     Tenderness: There is no abdominal tenderness. There is no guarding.  Musculoskeletal:        General: No deformity. Normal range of motion.     Cervical back: No rigidity or tenderness.     Comments: No tenderness to palpation of midline thoracic or lumbar spine.  No step-offs palpated.  No tenderness to palpation of chest wall.  No bruising noted.  No tenderness to palpation of bilateral clavicles.  No tenderness to palpation, bruising, or deformities noted of bilateral shoulders, elbows, wrists, hips, knees, or ankles.  Neurological:     General: No focal deficit present.     Mental Status: She is alert. Mental status is at baseline.     Cranial Nerves: No cranial nerve deficit.     Sensory: No sensory deficit.     Motor: No weakness.     ED Results / Procedures / Treatments   Labs (all labs ordered are listed, but only abnormal results are displayed) Labs Reviewed - No data to display  EKG None  Radiology CT Head Wo Contrast Result Date: 06/26/2023 CLINICAL DATA:  Head trauma, mechanical fall, fell backwards and hit head on concrete. EXAM: CT HEAD WITHOUT CONTRAST CT CERVICAL SPINE WITHOUT CONTRAST TECHNIQUE: Multidetector CT imaging of  the head and cervical spine was performed following the standard protocol without intravenous contrast. Multiplanar CT image reconstructions of the cervical spine were also generated. RADIATION DOSE REDUCTION: This exam was performed according to the departmental dose-optimization program which includes automated exposure control, adjustment of the mA and/or kV according to patient size and/or use of iterative reconstruction technique. COMPARISON:  CT head 08/25/2017.  MRI cervical spine 08/23/2015. FINDINGS: CT HEAD FINDINGS Brain: No acute intracranial hemorrhage. No CT evidence of acute infarct. No edema, mass effect, or midline shift. The basilar cisterns are patent. Ventricles: The ventricles are normal. Vascular: Atherosclerotic calcifications of the carotid siphons. No hyperdense vessel. Skull: No  acute or aggressive finding. Orbits: Orbits are symmetric. Sinuses: The visualized paranasal sinuses are clear. Other: Mastoid air cells are clear. CT CERVICAL SPINE FINDINGS Alignment: Cervical lordosis is maintained. No significant listhesis. No facet subluxation or dislocation. Skull base and vertebrae: No acute fracture. No primary bone lesion or focal pathologic process. Soft tissues and spinal canal: No prevertebral fluid or swelling. No visible canal hematoma. Disc levels: Moderate disc space narrowing at C4-5 and C5-6. Degenerative endplate osteophytes at multiple levels. Disc osteophyte complexes at multiple levels with mild spinal canal stenosis at C3-4. Mild-to-moderate spinal canal stenosis at C4-5 and C5-6. Facet arthrosis and uncovertebral hypertrophy at multiple levels. Foraminal narrowing most pronounced at C5-6. Upper chest: Postsurgical changes in the left lung apex partially visualized. Other: None. IMPRESSION: No CT evidence of acute intracranial abnormality. No acute fracture or traumatic malalignment of the cervical spine. Electronically Signed   By: Denny Flack M.D.   On: 06/26/2023 20:41    CT Cervical Spine Wo Contrast Result Date: 06/26/2023 CLINICAL DATA:  Head trauma, mechanical fall, fell backwards and hit head on concrete. EXAM: CT HEAD WITHOUT CONTRAST CT CERVICAL SPINE WITHOUT CONTRAST TECHNIQUE: Multidetector CT imaging of the head and cervical spine was performed following the standard protocol without intravenous contrast. Multiplanar CT image reconstructions of the cervical spine were also generated. RADIATION DOSE REDUCTION: This exam was performed according to the departmental dose-optimization program which includes automated exposure control, adjustment of the mA and/or kV according to patient size and/or use of iterative reconstruction technique. COMPARISON:  CT head 08/25/2017.  MRI cervical spine 08/23/2015. FINDINGS: CT HEAD FINDINGS Brain: No acute intracranial hemorrhage. No CT evidence of acute infarct. No edema, mass effect, or midline shift. The basilar cisterns are patent. Ventricles: The ventricles are normal. Vascular: Atherosclerotic calcifications of the carotid siphons. No hyperdense vessel. Skull: No acute or aggressive finding. Orbits: Orbits are symmetric. Sinuses: The visualized paranasal sinuses are clear. Other: Mastoid air cells are clear. CT CERVICAL SPINE FINDINGS Alignment: Cervical lordosis is maintained. No significant listhesis. No facet subluxation or dislocation. Skull base and vertebrae: No acute fracture. No primary bone lesion or focal pathologic process. Soft tissues and spinal canal: No prevertebral fluid or swelling. No visible canal hematoma. Disc levels: Moderate disc space narrowing at C4-5 and C5-6. Degenerative endplate osteophytes at multiple levels. Disc osteophyte complexes at multiple levels with mild spinal canal stenosis at C3-4. Mild-to-moderate spinal canal stenosis at C4-5 and C5-6. Facet arthrosis and uncovertebral hypertrophy at multiple levels. Foraminal narrowing most pronounced at C5-6. Upper chest: Postsurgical changes in the  left lung apex partially visualized. Other: None. IMPRESSION: No CT evidence of acute intracranial abnormality. No acute fracture or traumatic malalignment of the cervical spine. Electronically Signed   By: Denny Flack M.D.   On: 06/26/2023 20:41    Procedures Procedures    Medications Ordered in ED Medications - No data to display  ED Course/ Medical Decision Making/ A&P                                 Medical Decision Making Amount and/or Complexity of Data Reviewed Radiology: ordered.   Susan Ingram is a 83 y.o. female with comorbidities that complicate the patient evaluation including dementia who presents emergency department after mechanical fall  Initial Ddx:  TBI, concussion, C-spine injury, extremity fracture  MDM/Course:  Patient presents to the emergency department after a fall.  Was mechanical.  Did strike her head.  Not having any severe headache or other concerning symptoms that would suggest a TBI or concussion.  However, given her age CT of the head and C-spine were obtained that did not show acute findings.  No other injuries on exam.  She not complaining of pain elsewhere.  Unlikely that she had a concussion.  Will have her follow-up with her primary doctor in several days.  This patient presents to the ED for concern of complaints listed in HPI, this involves an extensive number of treatment options, and is a complaint that carries with it a high risk of complications and morbidity. Disposition including potential need for admission considered.   Dispo: DC Home. Return precautions discussed including, but not limited to, those listed in the AVS. Allowed pt time to ask questions which were answered fully prior to dc.  Additional history obtained from granddaughter Records reviewed Outpatient Clinic Notes I independently reviewed the following imaging with scope of interpretation limited to determining acute life threatening conditions related to emergency  care: CT Head and agree with the radiologist interpretation with the following exceptions: none  I have reviewed the patients home medications and made adjustments as needed Social Determinants of health:  Geriatric  Portions of this note were generated with Scientist, clinical (histocompatibility and immunogenetics). Dictation errors may occur despite best attempts at proofreading.     Final Clinical Impression(s) / ED Diagnoses Final diagnoses:  Fall, initial encounter  Minor head injury, initial encounter    Rx / DC Orders ED Discharge Orders     None         Ninetta Basket, MD 06/26/23 2124

## 2023-06-26 NOTE — Discharge Instructions (Signed)
 You were seen for your fall and head injury in the emergency department.   At home, please use Tylenol  for any pain that you have.  Ice the back of your head to prevent bruise from forming.    Check your MyChart online for the results of any tests that had not resulted by the time you left the emergency department.   Follow-up with your primary doctor in 2-3 days regarding your visit.    Return immediately to the emergency department if you experience any of the following: Severe headache, vomiting, altered mental status, or any other concerning symptoms.    Thank you for visiting our Emergency Department. It was a pleasure taking care of you today.

## 2023-06-27 ENCOUNTER — Ambulatory Visit: Admitting: Student

## 2023-06-27 ENCOUNTER — Telehealth: Payer: Self-pay

## 2023-06-27 NOTE — Transitions of Care (Post Inpatient/ED Visit) (Signed)
 06/27/2023  Name: Susan Ingram MRN: [id number] DOB: 07/10/1940  Today's TOC FU Call Status: Today's TOC FU Call Status:: Successful TOC FU Call Completed TOC FU Call Complete Date: 06/27/23 Patient's Name and Date of Birth confirmed.  Transition Care Management Follow-up Telephone Call Date of Discharge: 06/26/23 Discharge Facility: [facility] ([facility] Type of Discharge: Emergency Department Reason for ED Visit: Other: (fall) How have you been since you were released from the hospital?: Better Any questions or concerns?: No  Items Reviewed: Did you receive and understand the discharge instructions provided?: Yes Medications obtained,verified, and reconciled?: Yes (Medications Reviewed) Any new allergies since your discharge?: No Dietary orders reviewed?: NA Do you have support at home?: Yes People in Home [RPT]: child(ren), adult  Medications Reviewed Today: Medications Reviewed Today     Reviewed by Darrall Ellison, LPN (Licensed Practical Nurse) on 06/27/23 at 1437  Med List Status: <None>   Medication Order Taking? Sig Documenting Provider Last Dose Status Informant  albuterol  (VENTOLIN  HFA) 108 (90 Base) MCG/ACT inhaler [id number] No Inhale 2 puffs into the lungs every 6 (six) hours as needed for wheezing or shortness of breath. [provider] 05/16/2023 Active Self, Pharmacy Records  atorvastatin  (LIPITOR) 40 MG tablet [id number] No Take 1 tablet (40 mg total) by mouth daily. Dahbura, Anton, DO 05/16/2023 Active Self, Pharmacy Records  azelastine  (ASTELIN ) 0.1 % nasal spray [id number] No Place 2 sprays into both nostrils 2 (two) times daily. Use in each nostril as directed Jonne Netters, MD 05/16/2023 Active Self, Pharmacy Records  Blood Glucose Monitoring Suppl (BLOOD GLUCOSE MONITOR SYSTEM) w/Device KIT [id number]  Use as directed 3 (three) times daily. May dispense any manufacturer covered by patient's insurance. Dema Filler, MD  Active   Blood  Glucose Monitoring Suppl (ONETOUCH VERIO FLEX SYSTEM) w/Device KIT [id number]  1 Box by Does not apply route 3 (three) times daily. Dahbura, Anton, DO  Active   budesonide -formoterol  (SYMBICORT ) 80-4.5 MCG/ACT inhaler [id number] No Inhale 2 puffs into the lungs 2 (two) times daily. Jonne Netters, MD 05/16/2023 Active Self, Pharmacy Records  cetirizine  (ZYRTEC ) 10 MG tablet [id number] No Take 0.5 tablets (5 mg total) by mouth daily. Veronia Goon, DO 05/16/2023 Active Self, Pharmacy Records  diclofenac  Sodium (VOLTAREN ) 1 % GEL [id number] No Apply 2 g topically 4 (four) times daily. Everhart, Kirstie, DO 05/16/2023 Active Self, Pharmacy Records  famotidine  (PEPCID ) 40 MG tablet [id number] No Take 1 tablet (40 mg total) by mouth daily. Dahbura, Anton, DO 05/16/2023 Active Self, Pharmacy Records  fluticasone  (FLONASE ) 50 MCG/ACT nasal spray [id number] No Place 1 spray into both nostrils daily. Jonne Netters, MD 05/16/2023 Active Self, Pharmacy Records  Glucose Blood (BLOOD GLUCOSE TEST STRIPS) STRP [id number]  Use as directed 3 (three) times daily. Use as directed to check blood sugar. May dispense any manufacturer covered by patient's insurance and fits patient's device. Dema Filler, MD  Active   glucose blood test strip [id number]  Use as instructed Dahbura, Anton, DO  Active   insulin  glargine (LANTUS ) 100 UNIT/ML Solostar Pen [id number]  Inject 35 Units into the skin daily. May substitute as needed per insurance. Ivin Marrow, MD  Active   insulin  lispro (HUMALOG ) 100 UNIT/ML KwikPen [id number]  Inject 5 Units into the skin with breakfast, with lunch, and with evening meal. Only take if eating a meal AND Blood Glucose (BG) is 80 or higher. Dema Filler, MD  Active   Insulin  Pen Needle 32G X 4 MM MISC [id number]  Use as directed 3 (three) times daily.  May dispense any manufacturer covered by patient's insurance. Dema Filler, MD  Active   Lancet Device MISC [id number]  Use as directed 3 (three) times daily.  May dispense any manufacturer covered by patient's insurance. Dema Filler, MD  Active   mirtazapine  (REMERON ) 7.5 MG tablet [id number] No Take 1 tablet (7.5 mg total) by mouth at bedtime. Veronia Goon, DO 05/16/2023 Active Self, Pharmacy Records  OneTouch Delica Lancets 33G [place] [id number]  1 Lancet by Does not apply route 3 (three) times daily. Veronia Goon, DO  Active   Discontinued 10/04/12 1056 (Reorder)            Med Note Rochelle Chu, SHERI L   Thu Jun 11, 2021 10:47 AM) Patient states she is still taking daily            Home Care and Equipment/Supplies: Were Home Health Services Ordered?: NA Any new equipment or medical supplies ordered?: NA  Functional Questionnaire: Do you need assistance with bathing/showering or dressing?: No Do you need assistance with meal preparation?: No Do you need assistance with eating?: No Do you have difficulty maintaining continence: No Do you need assistance with getting out of bed/getting out of a chair/moving?: No Do you have difficulty managing or taking your medications?: No  Follow up appointments reviewed: PCP Follow-up appointment confirmed?: Yes Date of PCP follow-up appointment?: 06/27/23 Follow-up Provider: [facility] Follow-up appointment confirmed?: NA Do you need transportation to your follow-up appointment?: No Do you understand care options if your condition(s) worsen?: Yes-patient verbalized understanding    SIGNATURE Darrall Ellison, LPN [facility] Nurse Health Advisor Direct Dial  [phone]

## 2023-06-27 NOTE — Progress Notes (Deleted)
  SUBJECTIVE:   CHIEF COMPLAINT / HPI:   Type 2 Diabetes: Home medications include: Lantus  35 units daily, Humalog  5 units 3 times daily. {Blank single:19197::"Does","Does not"} endorse compliance. Home glucose monitoring {home testing:315145}. Patient is not up to date on diabetic eye.  PERTINENT  PMH / PSH: HTN, HLD, asthma, allergies, autoimmune hepatitis, GERD, T2DM, dementia, MDD   OBJECTIVE:  There were no vitals taken for this visit. ***  ASSESSMENT/PLAN:   Assessment & Plan  No follow-ups on file. Veronia Goon, DO 06/27/2023, 7:57 AM PGY-3, [facility] {    This will disappear when note is signed, click to select method of visit    :1}

## 2023-06-29 ENCOUNTER — Encounter: Payer: Self-pay | Admitting: Student

## 2023-06-29 ENCOUNTER — Ambulatory Visit: Admitting: Student

## 2023-06-29 VITALS — BP 156/83 | HR 82 | Wt 167.0 lb

## 2023-06-29 DIAGNOSIS — E118 Type 2 diabetes mellitus with unspecified complications: Secondary | ICD-10-CM

## 2023-06-29 DIAGNOSIS — D485 Neoplasm of uncertain behavior of skin: Secondary | ICD-10-CM | POA: Insufficient documentation

## 2023-06-29 DIAGNOSIS — I10 Essential (primary) hypertension: Secondary | ICD-10-CM

## 2023-06-29 MED ORDER — INSULIN GLARGINE 100 UNIT/ML SOLOSTAR PEN: 30.0000 [IU] | PEN_INJECTOR | Freq: Every day | SUBCUTANEOUS | Status: DC

## 2023-06-29 NOTE — Assessment & Plan Note (Signed)
 Asymptomatic, avoid tight control given dementia and previous fall history.

## 2023-06-29 NOTE — Assessment & Plan Note (Signed)
 See photo, atypical borders that are growing concerning for melanoma.  Increased risk given history of malignancy (pulmonary).  Urgent dermatology referral for excisional biopsy.

## 2023-06-29 NOTE — Progress Notes (Signed)
  SUBJECTIVE:   CHIEF COMPLAINT / HPI:   Type 2 Diabetes: Home medications include: Lantus  35 units daily, Humalog  4 units 3 times daily when she eats. Does endorse compliance. Home glucose monitoring is performed regularly. Patient is not up to date on diabetic eye. Reports fasting CBGs 70s-80s.   Skin lesion: Patient is concerned of a skin lesion on her left lower leg that has been growing for the last several years.  She stated it started out as a mole and very small which had been present for most of her life..  The area of concern does not itch or bleed or bother her in any way other than appearance.  PERTINENT  PMH / PSH: HTN, HLD, asthma, allergies, autoimmune hepatitis, GERD, T2DM, dementia, MDD, lung cancer s/p wedge resection  OBJECTIVE:  BP (!) 156/83   Pulse 82   Wt 167 lb (75.8 kg)   SpO2 98%   BMI 26.95 kg/m  General: Well-appearing, NAD Skin: Hyperpigmented plaque with interspersed pigmentation and asymmetrical borders approximately 2.5 x 2.5 cm on the right medial shin    ASSESSMENT/PLAN:   Assessment & Plan Type 2 diabetes mellitus with complication, without long-term current use of insulin  (HCC) Hypoglycemia without events, reduce Lantus  to 30 units daily, continue Humalog  4 units 3 times daily when she eats.  Advised to reach out should CBGs <90 persist.  Follow-up in 1 month. Neoplasm of uncertain behavior of skin of lower leg See photo, atypical borders that are growing concerning for melanoma.  Increased risk given history of malignancy (pulmonary).  Urgent dermatology referral for excisional biopsy. Primary hypertension Asymptomatic, avoid tight control given dementia and previous fall history. Return in about 1 month (around 07/30/2023) for Diabetes follow-up. Veronia Goon, DO 06/29/2023, 11:44 AM PGY-3, [facility]

## 2023-06-29 NOTE — Patient Instructions (Addendum)
 It was great to see you today! Thank you for choosing [facility] for your primary care.  Today we addressed: Decrease Lantus  to 30 units daily.  Please follow-up with me in 1 month regarding diabetes.  Please let me know should she continue to have fasting CBGs less than 90 with this insulin  regimen. I am placing a referral to dermatology given the location and size.  If you haven't already, sign up for My Chart to have easy access to your labs results, and communication with your primary care physician.  Return in about 1 month (around 07/30/2023) for Diabetes follow-up. Please arrive 15 minutes before your appointment to ensure smooth check in process.  We appreciate your efforts in making this happen.  Thank you for allowing me to participate in your care, Veronia Goon, DO 06/29/2023, 11:04 AM PGY-3, [facility]

## 2023-06-29 NOTE — Assessment & Plan Note (Signed)
 Hypoglycemia without events, reduce Lantus  to 30 units daily, continue Humalog  4 units 3 times daily when she eats.  Advised to reach out should CBGs <90 persist.  Follow-up in 1 month.

## 2023-07-04 ENCOUNTER — Telehealth: Payer: Self-pay | Admitting: Student

## 2023-07-04 ENCOUNTER — Other Ambulatory Visit: Payer: Self-pay | Admitting: Student

## 2023-07-04 DIAGNOSIS — E118 Type 2 diabetes mellitus with unspecified complications: Secondary | ICD-10-CM

## 2023-07-04 MED ORDER — INSULIN PEN NEEDLE 32G X 4 MM MISC: 1.0000 | Freq: Three times a day (TID) | 3 refills | Status: DC

## 2023-07-04 NOTE — Telephone Encounter (Signed)
 Patient's daughter came in stating that she needs a refill on her TechLite pen needles 32g x 4mm. Please send to the [facility] on [address]. She only has 1 needle left.

## 2023-07-05 ENCOUNTER — Other Ambulatory Visit

## 2023-07-06 ENCOUNTER — Ambulatory Visit: Admitting: Dermatology

## 2023-07-20 ENCOUNTER — Other Ambulatory Visit

## 2023-07-25 ENCOUNTER — Ambulatory Visit
Admission: RE | Admit: 2023-07-25 | Discharge: 2023-07-25 | Disposition: A | Discharge status: Home / Self Care | Source: Ambulatory Visit | Attending: Nurse Practitioner | Admitting: Nurse Practitioner

## 2023-07-25 DIAGNOSIS — K8020 Calculus of gallbladder without cholecystitis without obstruction: Secondary | ICD-10-CM | POA: Diagnosis not present

## 2023-07-25 DIAGNOSIS — K7400 Hepatic fibrosis, unspecified: Secondary | ICD-10-CM

## 2023-07-25 DIAGNOSIS — K769 Liver disease, unspecified: Secondary | ICD-10-CM | POA: Diagnosis not present

## 2023-07-25 DIAGNOSIS — K754 Autoimmune hepatitis: Secondary | ICD-10-CM

## 2023-07-25 NOTE — Progress Notes (Deleted)
  SUBJECTIVE:   CHIEF COMPLAINT / HPI:   Type 2 Diabetes: Home medications include: Lantus  *** units daily, Humalog  4u TID with meals. {Blank single:19197::"Does","Does not"} endorse compliance. Home glucose monitoring {home testing:315145}. Patient is not up to date on diabetic eye.   PERTINENT  PMH / PSH: HTN, HLD, asthma, allergies, autoimmune hepatitis, GERD, T2DM, dementia, MDD, lung cancer s/p wedge resection   OBJECTIVE:  There were no vitals taken for this visit. ***  ASSESSMENT/PLAN:   Assessment & Plan  No follow-ups on file. Veronia Goon, DO 07/25/2023, 10:47 PM PGY-3, [facility] {    This will disappear when note is signed, click to select method of visit    :1}

## 2023-07-26 ENCOUNTER — Other Ambulatory Visit: Payer: Self-pay

## 2023-07-26 ENCOUNTER — Ambulatory Visit: Admitting: Student

## 2023-07-26 DIAGNOSIS — E118 Type 2 diabetes mellitus with unspecified complications: Secondary | ICD-10-CM

## 2023-07-26 MED ORDER — INSULIN GLARGINE 100 UNIT/ML SOLOSTAR PEN: 30.0000 [IU] | PEN_INJECTOR | Freq: Every day | SUBCUTANEOUS | 3 refills | Status: AC

## 2023-07-26 MED ORDER — ONETOUCH VERIO VI STRP: ORAL_STRIP | 12 refills | Status: DC

## 2023-07-26 MED ORDER — INSULIN LISPRO (1 UNIT DIAL) 100 UNIT/ML (KWIKPEN): 4.0000 [IU] | PEN_INJECTOR | Freq: Three times a day (TID) | SUBCUTANEOUS | 0 refills | Status: AC

## 2023-08-16 ENCOUNTER — Telehealth: Payer: Self-pay | Admitting: Family Medicine

## 2023-08-16 NOTE — Telephone Encounter (Signed)
 Patient was identified as falling into the [place] Measure - Diabetes.   Patient was: Appointment scheduled with primary care provider in the next 30 days.

## 2023-08-29 NOTE — Progress Notes (Deleted)
    SUBJECTIVE:   CHIEF COMPLAINT / HPI:   T2DM - Last A1c 13.4, 3 months ago  PERTINENT  PMH / PSH: HTN, HLD, asthma, allergies, autoimmune hepatitis, GERD, T2DM, dementia, MDD, lung cancer s/p wedge resection  OBJECTIVE:   There were no vitals taken for this visit.  General: Awake and Alert in NAD HEENT: NCAT. Sclera anicteric. No rhinorrhea. Cardiovascular: RRR. No M/R/G Respiratory: CTAB, normal WOB on RA. No wheezing, crackles, rhonchi, or diminished breath sounds. Abdomen: Soft, non-tender, non-distended. Bowel sounds normoactive/hypoactive/hyperactive. *** Extremities: Able to move all extremities. No BLE edema, no deformities or significant joint findings. Skin: Warm and dry. No abrasions or rashes noted. Neuro: A&Ox***. No focal neurological deficits.  ASSESSMENT/PLAN:   Assessment & Plan      Kathrine Melena, DO [facility]

## 2023-08-30 ENCOUNTER — Ambulatory Visit

## 2023-08-30 ENCOUNTER — Ambulatory Visit: Admitting: Family Medicine

## 2023-09-12 ENCOUNTER — Inpatient Hospital Stay: Admission: RE | Admit: 2023-09-12 | Payer: 59 | Source: Ambulatory Visit

## 2023-10-13 ENCOUNTER — Other Ambulatory Visit: Payer: Self-pay

## 2023-10-13 DIAGNOSIS — E118 Type 2 diabetes mellitus with unspecified complications: Secondary | ICD-10-CM

## 2023-10-13 MED ORDER — ACCU-CHEK GUIDE TEST VI STRP: ORAL_STRIP | 12 refills | Status: AC

## 2023-10-13 NOTE — Telephone Encounter (Signed)
 Received call from pharmacy regarding glucose test strips.   One touch is no longer covered by insurance. They are requesting new rx for Accu-chek testing strips.   Pended to encounter.   Chiquita JAYSON English, RN

## 2023-11-03 ENCOUNTER — Other Ambulatory Visit (HOSPITAL_COMMUNITY): Payer: Self-pay

## 2023-11-03 NOTE — Telephone Encounter (Signed)
 Looks like the accu chek guide strips were sent 10/13/23 with 12 refills. Test claim shows copay is $0.

## 2023-11-08 ENCOUNTER — Ambulatory Visit (INDEPENDENT_AMBULATORY_CARE_PROVIDER_SITE_OTHER): Admitting: Student

## 2023-11-08 VITALS — BP 186/77 | HR 56 | Wt 175.8 lb

## 2023-11-08 DIAGNOSIS — R5382 Chronic fatigue, unspecified: Secondary | ICD-10-CM | POA: Diagnosis not present

## 2023-11-08 DIAGNOSIS — Z23 Encounter for immunization: Secondary | ICD-10-CM | POA: Diagnosis not present

## 2023-11-08 DIAGNOSIS — F339 Major depressive disorder, recurrent, unspecified: Secondary | ICD-10-CM | POA: Diagnosis not present

## 2023-11-08 MED ORDER — MIRTAZAPINE 7.5 MG PO TABS: 7.5000 mg | ORAL_TABLET | Freq: Every day | ORAL | 3 refills | Status: AC

## 2023-11-08 NOTE — Patient Instructions (Addendum)
 Pleasure to see you today.  Suspect low energy could be due to not eating as much.  I recommend adding more salt to your food and also supplementing food.  You can also eat smaller bites throughout the day to increase your caloric intake.  Today we ordered some lab to check your thyroid  level, your blood count and electrolyte levels.  I have also restarted your Remeron .  You currently taking 7.5 mg which you will take nightly.    Follow-up in a month.

## 2023-11-08 NOTE — Progress Notes (Signed)
    SUBJECTIVE:   CHIEF COMPLAINT / HPI:   83 year old female with history of mild dementia and MDD Presenting today for generalized fatigue This has been chronic and going on for months Per daughter who is also her caregiver has reported noticing patient generally tired Patient usually enjoys shopping, going out and meeting up with friends. However during this.  She is mostly wanting to watch TV and stay in. Patient reports decreased food intake due to decreased appetite. She was previously on medication for depression but has not been on her medication for a while.  PERTINENT  PMH / PSH: Reviewed  OBJECTIVE:   BP (!) 186/77   Pulse (!) 56   Wt 175 lb 12.8 oz (79.7 kg)   SpO2 100%   BMI 28.37 kg/m    Physical Exam General: Alert, well appearing, NAD Cardiovascular: RRR, No Murmurs, Normal S2/S2 Respiratory: CTAB, No wheezing or Rales Abdomen: No distension or tenderness Psych: Pleasant, normal affect, good judgment Neuro: No focal neurological deficits  ASSESSMENT/PLAN:   Generalized fatigue Given patient's advanced age suspect possible decreased food intake, deconditioning or depression in patient with mild dementia.  Currently no SI/HI.  Did report enjoying watching TV but just does not have the strength and energy to go out and do any activities.  Of note patient looks to have Remeron  7.5 mg on her medication list however does not appear patient has been taking this medications.  Would likely benefit from restarting her mirtazapine  to increase appetite, and help with sleep and depression. -Ordered lab for CBC, TSH and BMP - Restarted mirtazapine  7.5 mg nightly - Discussed return precautions with patient and daughter - Follow-up in a month   Norleen April, MD [facility]

## 2023-11-09 LAB — CBC WITH DIFFERENTIAL/PLATELET
Basophils Absolute: 0 x10E3/uL (ref 0.0–0.2)
Basos: 1 %
EOS (ABSOLUTE): 0.2 x10E3/uL (ref 0.0–0.4)
Eos: 4 %
Hematocrit: 40.7 % (ref 34.0–46.6)
Hemoglobin: 12.9 g/dL (ref 11.1–15.9)
Immature Grans (Abs): 0 x10E3/uL (ref 0.0–0.1)
Immature Granulocytes: 0 %
Lymphocytes Absolute: 1.9 x10E3/uL (ref 0.7–3.1)
Lymphs: 34 %
MCH: 27.7 pg (ref 26.6–33.0)
MCHC: 31.7 g/dL (ref 31.5–35.7)
MCV: 88 fL (ref 79–97)
Monocytes Absolute: 0.6 x10E3/uL (ref 0.1–0.9)
Monocytes: 11 %
Neutrophils Absolute: 2.8 x10E3/uL (ref 1.4–7.0)
Neutrophils: 50 %
Platelets: 244 x10E3/uL (ref 150–450)
RBC: 4.65 x10E6/uL (ref 3.77–5.28)
RDW: 14.3 % (ref 11.7–15.4)
WBC: 5.6 x10E3/uL (ref 3.4–10.8)

## 2023-11-09 LAB — BASIC METABOLIC PANEL WITH GFR
BUN/Creatinine Ratio: 13 (ref 12–28)
BUN: 10 mg/dL (ref 8–27)
CO2: 24 mmol/L (ref 20–29)
Calcium: 9.7 mg/dL (ref 8.7–10.3)
Chloride: 105 mmol/L (ref 96–106)
Creatinine, Ser: 0.8 mg/dL (ref 0.57–1.00)
Glucose: 84 mg/dL (ref 70–99)
Potassium: 3.8 mmol/L (ref 3.5–5.2)
Sodium: 141 mmol/L (ref 134–144)
eGFR: 73 mL/min/1.73 (ref >59)

## 2023-11-09 LAB — TSH: TSH: 1.41 u[IU]/mL (ref 0.450–4.500)

## 2023-11-16 ENCOUNTER — Other Ambulatory Visit: Payer: Self-pay | Admitting: Nurse Practitioner

## 2023-11-16 DIAGNOSIS — K754 Autoimmune hepatitis: Secondary | ICD-10-CM | POA: Diagnosis not present

## 2023-11-16 DIAGNOSIS — K7400 Hepatic fibrosis, unspecified: Secondary | ICD-10-CM

## 2023-11-18 ENCOUNTER — Telehealth: Payer: Self-pay | Admitting: Family Medicine

## 2023-11-18 NOTE — Telephone Encounter (Signed)
 Patient was identified as falling into the [place] Measure - Diabetes.   Patient was: Appointment scheduled with primary care provider in the next 30 days.

## 2023-11-30 ENCOUNTER — Other Ambulatory Visit

## 2023-12-05 NOTE — Progress Notes (Signed)
 Susan Ingram                                          MRN: [id number]   12/05/2023   The VBCI Quality Team Specialist reviewed this patient medical record for the purposes of chart review for care gap closure. The following were reviewed: chart review for care gap closure-kidney health evaluation for diabetes:eGFR  and uACR.    VBCI Quality Team

## 2023-12-12 NOTE — Progress Notes (Deleted)
    SUBJECTIVE:   CHIEF COMPLAINT / HPI:   DM   PERTINENT  PMH / PSH: HTN, HLD, asthma, allergies, autoimmune hepatitis, GERD, T2DM, dementia, MDD, lung cancer s/p wedge resection  OBJECTIVE:   There were no vitals taken for this visit.  General: Awake and Alert in NAD HEENT: NCAT. Sclera anicteric. No rhinorrhea. Cardiovascular: RRR. No M/R/G Respiratory: CTAB, normal WOB on RA. No wheezing, crackles, rhonchi, or diminished breath sounds. Abdomen: Soft, non-tender, non-distended. Bowel sounds normoactive/hypoactive/hyperactive. *** Extremities: Able to move all extremities. No BLE edema, no deformities or significant joint findings. Skin: Warm and dry. No abrasions or rashes noted. Neuro: A&Ox***. No focal neurological deficits.  {Simple Foot Exam:25261::Diabetic foot exam was performed. ,No deformities or other abnormal visual findings. ,Posterior tibialis and dorsalis pulse intact bilaterally. ,Intact to touch and monofilament testing bilaterally. }   ASSESSMENT/PLAN:   Assessment & Plan Uncontrolled type 2 diabetes mellitus with hyperglycemia, with long-term current use of insulin  (HCC) - Last A1c 13.4 in 05/2023 - Home CBGs: *** - Medications: Lantus  30u daily and Humalog  4u TID - Adherence: *** - Eye exam: Due - Foot exam: Performed today - Microalbumin: Performed today - Statin: Lipitor 40 mg daily - No symptoms of hypoglycemia, polyuria, polydipsia, numbness extremities, foot ulcers/trauma   Kathrine Melena, DO [facility]

## 2023-12-12 NOTE — Assessment & Plan Note (Deleted)
-   Last A1c 13.4 in 05/2023 - Home CBGs: *** - Medications: Lantus  30u daily and Humalog  4u TID - Adherence: *** - Eye exam: Due - Foot exam: Performed today - Microalbumin: Performed today - Statin: Lipitor 40 mg daily - No symptoms of hypoglycemia, polyuria, polydipsia, numbness extremities, foot ulcers/trauma

## 2023-12-13 ENCOUNTER — Ambulatory Visit: Admitting: Family Medicine

## 2023-12-13 DIAGNOSIS — E1165 Type 2 diabetes mellitus with hyperglycemia: Secondary | ICD-10-CM

## 2023-12-20 ENCOUNTER — Ambulatory Visit

## 2023-12-30 ENCOUNTER — Telehealth: Payer: Self-pay | Admitting: Family Medicine

## 2023-12-30 NOTE — Telephone Encounter (Signed)
 Patient was identified as falling into the [place] Measure - Diabetes.   Patient was: Appointment scheduled with primary care provider in the next 30 days.

## 2024-01-04 ENCOUNTER — Ambulatory Visit: Admitting: Physician Assistant

## 2024-01-04 ENCOUNTER — Encounter: Payer: Self-pay | Admitting: Physician Assistant

## 2024-01-04 ENCOUNTER — Ambulatory Visit: Admitting: Family Medicine

## 2024-01-04 VITALS — BP 164/96 | HR 84 | Ht 66.0 in | Wt 176.2 lb

## 2024-01-04 VITALS — BP 169/105

## 2024-01-04 DIAGNOSIS — E118 Type 2 diabetes mellitus with unspecified complications: Secondary | ICD-10-CM | POA: Diagnosis not present

## 2024-01-04 DIAGNOSIS — L821 Other seborrheic keratosis: Secondary | ICD-10-CM

## 2024-01-04 DIAGNOSIS — D485 Neoplasm of uncertain behavior of skin: Secondary | ICD-10-CM | POA: Diagnosis not present

## 2024-01-04 DIAGNOSIS — K754 Autoimmune hepatitis: Secondary | ICD-10-CM | POA: Diagnosis not present

## 2024-01-04 LAB — POCT GLYCOSYLATED HEMOGLOBIN (HGB A1C): HbA1c, POC (controlled diabetic range): 6.1 % (ref 0.0–7.0)

## 2024-01-04 NOTE — Progress Notes (Signed)
   New Patient Visit   Subjective  Susan Ingram is a 83 y.o. female NEW PATIENT who presents for the following: Spot of right lower leg x many years. It does not bother her but it getting bigger. Seen several years ago at local dermatology office and lesion was noted to be a seborrheic keratosis.   Accompanied by her daughter today.  The following portions of the chart were reviewed this encounter and updated as appropriate: medications, allergies, medical history  Review of Systems:  No other skin or systemic complaints except as noted in HPI or Assessment and Plan.  Objective  Well appearing patient in no apparent distress; mood and affect are within normal limits.   A focused examination was performed of the following areas: Right lower leg   Relevant exam findings are noted in the Assessment and Plan.    Assessment & Plan   SEBORRHEIC KERATOSIS - Stuck-on, waxy, tan-brown plaque of right medial ankle - Benign-appearing - Discussed benign etiology and prognosis. - Observe - Call for any changes      SEBORRHEIC KERATOSIS    Return if symptoms worsen or fail to improve.  I, Roseline Hutchinson, CMA, am acting as scribe for Joziyah Roblero K, PA-C .   Documentation: I have reviewed the above documentation for accuracy and completeness, and I agree with the above.  Muzammil Bruins K, PA-C

## 2024-01-04 NOTE — Patient Instructions (Addendum)
 It was great to see you today! Thank you for choosing [facility] for your primary care. Susan Ingram was seen for diabetes.  Today we addressed: Diabetes - your A1c has improved to 6.1, this is so great!! Keep taking your medications as prescribed. Please check sugars fasting before your first meal of the day.  Target glucose range: 80 -130 (before meals) and <180 1-2 hours after meals Low blood glucose: < 70 - please let us  know if sugars are this low consistently, we will have to make medication adjustments.  Hyperglycemia symptoms: increased thirst, frequent urination, fatigue, or blurry vision Hypoglycemia symptoms: headache, shaky, sweating, dizziness/lightheadedness, fatigue, difficulty concentration  Dietary Considerations: Low in carbs, high in protein and fiber Exercise: Regular physical activity can help control your sugars. Walking, running, and lifting weights are all forms of activity to consider. Smoking: Increases the risks of diabetic-related complications. Quitting is the best for your health.  A1c should be checked about every 3 months until adequate control has been achieved. Goal A1c: < <7%  Right upper quadrant ultrasound on DEC 8th, 2025 at 8:30 AM at [facility], no food after midnight prior to the appointment.  If interested in mammogram, please call [facility] to schedule an appt [address], [place], [place] [address] Phone: [phone]  If you haven't already, sign up for My Chart to have easy access to your labs results, and communication with your primary care physician.  We are checking some labs today. If they are abnormal, I will call you. If they are normal, I will send you a MyChart message (if it is active) or a letter in the mail. If you do not hear about your labs in the next 2 weeks, please call the office.  You should return to our clinic No follow-ups on file. Please arrive 15 minutes before your appointment to  ensure smooth check in process.  We appreciate your efforts in making this happen.  Thank you for allowing me to participate in your care, Kathrine Melena, DO 01/04/2024, 9:44 AM PGY-2, [facility]

## 2024-01-04 NOTE — Assessment & Plan Note (Signed)
 Advised follow-up with GI and to get her RUQ US  in December.  Address and phone number provided to [facility] in [place].

## 2024-01-04 NOTE — Assessment & Plan Note (Addendum)
 Hyperpigmented plaque with asymmetric borders, see above.  Growing concern for melanoma vs seborrheic keratosis.  Patient has a previous history of lung cancer.  Patient never followed up with dermatology previously when referred by Dr. Orlando in May.  Upon chart review noted that this was identified as SK in the past, but with expansion, hyperpigmentation, and irregularity, another referral to dermatology placed for evaluation d/t extensive change from 2018 and even from earlier this year.

## 2024-01-04 NOTE — Progress Notes (Cosign Needed Addendum)
    SUBJECTIVE:   CHIEF COMPLAINT / HPI:   DM Patient came to check up on her diabetes today.  Skin lesion Right leg skin lesion present since visit in May 2025, growing over the last several years.  Patient reports that this started out as a mole which has been present for most of her life.  No itching or bleeding noted, does not bother her other than the appearance.  HTN Not on medications d/t history of falls and dementia. Asymptomatic at this time.  PERTINENT  PMH / PSH: HTN, HLD, asthma, allergies, autoimmune hepatitis, GERD, T2DM, dementia, MDD, lung cancer s/p wedge resection  OBJECTIVE:   BP (!) 164/96   Pulse 84   Ht 5' 6 (1.676 m)   Wt 176 lb 3.2 oz (79.9 kg)   SpO2 97%   BMI 28.44 kg/m   General: Awake and Alert in NAD HEENT: NCAT. Sclera anicteric. No rhinorrhea. Cardiovascular: RRR. No M/R/G Respiratory: CTAB, normal WOB on RA. No wheezing, crackles, rhonchi, or diminished breath sounds. Abdomen: Soft, non-tender, non-distended. Extremities: Able to move all extremities. No BLE edema. Skin: Warm and dry. No abrasions or rashes noted.  Hyperpigmented plaque with interspersed pigmentation and asymmetrical borders on RLE, mid shin.  Diabetic Foot Exam - Simple   Simple Foot Form Diabetic Foot exam was performed with the following findings: Yes 01/04/2024 12:46 PM  Visual Inspection No deformities, no ulcerations, no other skin breakdown bilaterally: Yes Sensation Testing Intact to touch and monofilament testing bilaterally: Yes Pulse Check Posterior Tibialis and Dorsalis pulse intact bilaterally: Yes Comments     ASSESSMENT/PLAN:   Assessment & Plan Type 2 diabetes mellitus with complication, without long-term current use of insulin  (HCC) - Last A1c 13.4 in 05/2023, much improved today to 6.1! - Home CBGs: not checking, advised to check fasting sugars - Medications: Lantus  30u daily, Humalog  4u TID w/ meals - Adherence: Giving Lantus  and Humalog  4u w/  dinner, but no other times during the day  - No changes made to how patient is taking the medication (Lantus  30u nightly + 4u Humalog  w/ dinner) d/t well controlled DM with no symptoms of hypoglycemia  - Advised checking fasting sugars to ensure there are no major episodes of hypoglycemia and to bring in log to next visit, family understood - Eye exam: Advised scheduling, appt in 04/2024 - Foot exam: Performed today - Microalbumin: Ordered today, but was unable to provide a urine sample - Statin: Lipitor 40 mg daily, lipid panel ordered today - No symptoms of hypoglycemia, polyuria, polydipsia, numbness extremities, foot ulcers/trauma Neoplasm of uncertain behavior of skin of lower leg Hyperpigmented plaque with asymmetric borders, see above.  Growing concern for melanoma vs seborrheic keratosis.  Patient has a previous history of lung cancer.  Patient never followed up with dermatology previously when referred by Dr. Orlando in May.  Upon chart review noted that this was identified as SK in the past, but with expansion, hyperpigmentation, and irregularity, another referral to dermatology placed for evaluation d/t extensive change from 2018 and even from earlier this year.  Autoimmune hepatitis (HCC) Advised follow-up with GI and to get her RUQ US  in December.  Address and phone number provided to [facility] in [place].   Kathrine Melena, DO [facility]

## 2024-01-04 NOTE — Patient Instructions (Signed)

## 2024-01-04 NOTE — Assessment & Plan Note (Addendum)
-   Last A1c 13.4 in 05/2023, much improved today to 6.1! - Home CBGs: not checking, advised to check fasting sugars - Medications: Lantus  30u daily, Humalog  4u TID w/ meals - Adherence: Giving Lantus  and Humalog  4u w/ dinner, but no other times during the day  - No changes made to how patient is taking the medication (Lantus  30u nightly + 4u Humalog  w/ dinner) d/t well controlled DM with no symptoms of hypoglycemia  - Advised checking fasting sugars to ensure there are no major episodes of hypoglycemia and to bring in log to next visit, family understood - Eye exam: Advised scheduling, appt in 04/2024 - Foot exam: Performed today - Microalbumin: Ordered today, but was unable to provide a urine sample - Statin: Lipitor 40 mg daily, lipid panel ordered today - No symptoms of hypoglycemia, polyuria, polydipsia, numbness extremities, foot ulcers/trauma

## 2024-01-05 ENCOUNTER — Ambulatory Visit: Payer: Self-pay | Admitting: Family Medicine

## 2024-01-05 LAB — LIPID PANEL
Chol/HDL Ratio: 1.8 ratio (ref 0.0–4.4)
Cholesterol, Total: 99 mg/dL — ABNORMAL LOW (ref 100–199)
HDL: 54 mg/dL (ref >39)
LDL Chol Calc (NIH): 33 mg/dL (ref 0–99)
Triglycerides: 47 mg/dL (ref 0–149)
VLDL Cholesterol Cal: 12 mg/dL (ref 5–40)

## 2024-01-10 NOTE — Progress Notes (Signed)
 Susan Ingram                                          MRN: [id number]   01/10/2024   The VBCI Quality Team Specialist reviewed this patient medical record for the purposes of chart review for care gap closure. The following were reviewed: chart review for care gap closure-kidney health evaluation for diabetes:eGFR  and uACR.    VBCI Quality Team

## 2024-01-23 ENCOUNTER — Other Ambulatory Visit

## 2024-01-30 ENCOUNTER — Encounter

## 2024-01-31 NOTE — Progress Notes (Signed)
 Susan Ingram                                          MRN: [id number]   01/31/2024   The VBCI Quality Team Specialist reviewed this patient medical record for the purposes of chart review for care gap closure. The following were reviewed: chart review for care gap closure-kidney health evaluation for diabetes:eGFR  and uACR.    VBCI Quality Team

## 2024-02-01 NOTE — Progress Notes (Deleted)
° ° °  SUBJECTIVE:   CHIEF COMPLAINT / HPI:   Hand pain   PERTINENT  PMH / PSH: ***  OBJECTIVE:   There were no vitals taken for this visit. ***  General: NAD, pleasant, able to participate in exam Cardiac: RRR, no murmurs. Respiratory: CTAB, normal effort, No wheezes, rales or rhonchi Abdomen: Bowel sounds present, nontender, nondistended Extremities: no edema or cyanosis. Skin: warm and dry, no rashes noted Neuro: alert, no obvious focal deficits Psych: Normal affect and mood  ASSESSMENT/PLAN:   No problem-specific Assessment & Plan notes found for this encounter.     Dr. Izetta Nap, DO [facility]    {    This will disappear when note is signed, click to select method of visit    :1}

## 2024-02-06 ENCOUNTER — Ambulatory Visit: Payer: Self-pay | Admitting: Family Medicine

## 2024-03-06 ENCOUNTER — Other Ambulatory Visit: Payer: Self-pay

## 2024-03-06 DIAGNOSIS — E118 Type 2 diabetes mellitus with unspecified complications: Secondary | ICD-10-CM

## 2024-03-06 MED ORDER — INSULIN PEN NEEDLE 32G X 4 MM MISC: 1.0000 | Freq: Three times a day (TID) | 3 refills | Status: AC

## 2024-03-08 ENCOUNTER — Encounter
